# Patient Record
Sex: Female | Born: 1965
Health system: Southern US, Community
[De-identification: ages and names within clinical notes are randomized; demographics above are authoritative.]

## PROBLEM LIST (undated history)

## (undated) DIAGNOSIS — K259 Gastric ulcer, unspecified as acute or chronic, without hemorrhage or perforation: Secondary | ICD-10-CM

## (undated) DIAGNOSIS — G629 Polyneuropathy, unspecified: Secondary | ICD-10-CM

## (undated) DIAGNOSIS — Z9889 Other specified postprocedural states: Secondary | ICD-10-CM

## (undated) DIAGNOSIS — M549 Dorsalgia, unspecified: Secondary | ICD-10-CM

## (undated) DIAGNOSIS — I491 Atrial premature depolarization: Secondary | ICD-10-CM

## (undated) DIAGNOSIS — M1990 Unspecified osteoarthritis, unspecified site: Secondary | ICD-10-CM

## (undated) DIAGNOSIS — Z8719 Personal history of other diseases of the digestive system: Secondary | ICD-10-CM

## (undated) DIAGNOSIS — K219 Gastro-esophageal reflux disease without esophagitis: Secondary | ICD-10-CM

## (undated) DIAGNOSIS — M5417 Radiculopathy, lumbosacral region: Secondary | ICD-10-CM

## (undated) DIAGNOSIS — G544 Lumbosacral root disorders, not elsewhere classified: Secondary | ICD-10-CM

## (undated) DIAGNOSIS — R059 Cough, unspecified: Secondary | ICD-10-CM

## (undated) DIAGNOSIS — R000 Tachycardia, unspecified: Secondary | ICD-10-CM

## (undated) DIAGNOSIS — F32A Depression, unspecified: Secondary | ICD-10-CM

## (undated) DIAGNOSIS — F329 Major depressive disorder, single episode, unspecified: Secondary | ICD-10-CM

## (undated) DIAGNOSIS — E119 Type 2 diabetes mellitus without complications: Secondary | ICD-10-CM

## (undated) DIAGNOSIS — G8929 Other chronic pain: Secondary | ICD-10-CM

## (undated) DIAGNOSIS — F419 Anxiety disorder, unspecified: Secondary | ICD-10-CM

## (undated) DIAGNOSIS — R51 Headache: Secondary | ICD-10-CM

## (undated) DIAGNOSIS — G4733 Obstructive sleep apnea (adult) (pediatric): Secondary | ICD-10-CM

## (undated) DIAGNOSIS — F4310 Post-traumatic stress disorder, unspecified: Secondary | ICD-10-CM

## (undated) DIAGNOSIS — G43909 Migraine, unspecified, not intractable, without status migrainosus: Secondary | ICD-10-CM

## (undated) DIAGNOSIS — E669 Obesity, unspecified: Secondary | ICD-10-CM

## (undated) DIAGNOSIS — I499 Cardiac arrhythmia, unspecified: Secondary | ICD-10-CM

## (undated) DIAGNOSIS — R05 Cough: Secondary | ICD-10-CM

## (undated) DIAGNOSIS — I10 Essential (primary) hypertension: Secondary | ICD-10-CM

## (undated) DIAGNOSIS — R32 Unspecified urinary incontinence: Secondary | ICD-10-CM

## (undated) DIAGNOSIS — M797 Fibromyalgia: Secondary | ICD-10-CM

## (undated) HISTORY — DX: Headache: R51

## (undated) HISTORY — DX: Radiculopathy, lumbosacral region: M54.17

## (undated) HISTORY — PX: CARDIAC CATHETERIZATION: SHX172

## (undated) HISTORY — DX: Depression, unspecified: F32.A

## (undated) HISTORY — DX: Anxiety disorder, unspecified: F41.9

## (undated) HISTORY — DX: Major depressive disorder, single episode, unspecified: F32.9

## (undated) HISTORY — PX: DILATION AND CURETTAGE OF UTERUS: SHX78

## (undated) HISTORY — DX: Lumbosacral root disorders, not elsewhere classified: G54.4

## (undated) HISTORY — DX: Obstructive sleep apnea (adult) (pediatric): G47.33

## (undated) HISTORY — DX: Obesity, unspecified: E66.9

## (undated) HISTORY — PX: ABLATION: SHX5711

## (undated) HISTORY — PX: ABDOMINAL HYSTERECTOMY: SHX81

---

## 1999-01-28 HISTORY — PX: BREAST SURGERY: SHX581

## 2000-01-06 ENCOUNTER — Encounter (INDEPENDENT_AMBULATORY_CARE_PROVIDER_SITE_OTHER): Payer: Self-pay | Admitting: Specialist

## 2000-01-06 ENCOUNTER — Ambulatory Visit (HOSPITAL_BASED_OUTPATIENT_CLINIC_OR_DEPARTMENT_OTHER): Admission: RE | Admit: 2000-01-06 | Discharge: 2000-01-06 | Payer: Self-pay | Admitting: Specialist

## 2002-01-27 HISTORY — PX: CHOLECYSTECTOMY: SHX55

## 2002-12-04 ENCOUNTER — Inpatient Hospital Stay (HOSPITAL_COMMUNITY): Admission: AD | Admit: 2002-12-04 | Discharge: 2002-12-07 | Payer: Self-pay | Admitting: Psychiatry

## 2004-01-30 ENCOUNTER — Ambulatory Visit: Payer: Self-pay | Admitting: Cardiology

## 2007-07-26 ENCOUNTER — Ambulatory Visit: Payer: Self-pay | Admitting: Cardiology

## 2007-08-19 ENCOUNTER — Ambulatory Visit: Payer: Self-pay | Admitting: Cardiology

## 2007-09-10 ENCOUNTER — Ambulatory Visit: Payer: Self-pay | Admitting: Cardiology

## 2008-01-28 HISTORY — PX: BACK SURGERY: SHX140

## 2008-10-26 DIAGNOSIS — I10 Essential (primary) hypertension: Secondary | ICD-10-CM | POA: Insufficient documentation

## 2008-10-26 DIAGNOSIS — R0989 Other specified symptoms and signs involving the circulatory and respiratory systems: Secondary | ICD-10-CM | POA: Insufficient documentation

## 2008-10-26 DIAGNOSIS — I471 Supraventricular tachycardia: Secondary | ICD-10-CM

## 2010-06-11 NOTE — Assessment & Plan Note (Signed)
[facility]                          [facility] OFFICE NOTE   NAME:Ashley [name]                          MRN:          [id number]  DATE:09/10/2007                            DOB:          1965/02/27    PRIMARY CARDIOLOGIST:  Learta Codding, MD, [facility]   REASON FOR VISIT:  Post-hospital followup.   HISTORY OF PRESENT ILLNESS:  Ashley [name] is a pleasant 45 year old woman  with a history of anxiety and hypertension with previous exercise  Cardiolite back in 2006 demonstrating no frank ischemia with a normal  left ventricular ejection fraction of 63%.  She was recently seen in the  hospital by Dr. Andee Lineman as an inpatient consult with evidence of a wide  complex tachycardia in the setting of hypokalemia.  She ruled out for  myocardial infarction with normal troponin-I levels and also had a  normal TSH.  Echocardiography demonstrated no major structural defects  with an ejection fraction of 60-65% and no major valvular abnormalities.  She was placed on verapamil SR 120 mg daily and reports that this has  helped some, although she continues to have a sense of palpitations and  general chest discomfort particularly with anxiety and increased stress  in her life.  She was referred for a CardioNet monitor.  I reviewed  these strips today, which were previously reviewed by Mr. Alben Spittle.  There  is 1 episode documented of what looks to be a fairly regular narrow  complex tachycardia that may be due to a reentrant mechanism.  This was  brief, lasting less than 10 seconds.  It may have been that her wide  complex rhythm in the hospital was an aberrantly conducted  supraventricular arrhythmia.  In any event, she has not had any  reproducible exertional chest pain and she has had no syncope with these  events.   ALLERGIES:  SULFA DRUGS and MORPHINE.   PRESENT MEDICATIONS:  1. Celexa 20 mg p.o. daily.  2. Verapamil SR 120 mg p.o. daily.  3. Aspirin 81 mg p.o.  daily.  4. Xanax 0.5 mg p.o. p.r.n.  5. Vicodin 5/500 mg p.o. p.r.n.  6. Imitrex 100 mg p.r.n.   REVIEW OF SYSTEMS:  As per history of present illness.  Negative.   PHYSICAL EXAMINATION:  VITAL SIGNS:  Blood pressure 153/95, heart rate  83, and weight is 184 pounds.  GENERAL:  The patient is comfortable in no acute distress.  HEENT:  Conjunctiva is normal.  Oropharynx is clear.  NECK:  Supple.  No elevated jugular venous pressure.  No audible bruits.  LUNGS:  Clear with diminished breath sounds.  CARDIAC:  Regular rate and rhythm.  No pathologic murmur, S3 gallop, or  pericardial rub.  ABDOMEN:  Soft, nontender.  EXTREMITIES:  Exhibit no frank pitting edema.  Distal pulse 2+.  SKIN:  Warm and dry.  MUSCULOSKELETAL:  No kyphosis noted.  NEUROPSYCHIATRIC:  The patient is alert and oriented x3, somewhat  anxious at baseline.   IMPRESSION AND RECOMMENDATIONS:  Evidence of supraventricular  tachycardia, possibly due to a reentrant mechanism based on a  brief  event documented on CardioNet monitoring.  She also had an episode of  limited wide complex tachycardia during her last hospital stay at which  time she ruled out for myocardial infarction and had no major structural  cardiac defects by echocardiography.  Previous ischemic testing in 2006  was reassuring and she is not reporting any clearly reproducible  exertional chest pain.  At this point, I plan to increase her verapamil  SR to 180 mg daily.  I spoke about avoiding stimulants and caffeine as  well.  She also reports that stress and anxiety seem to be a major  component of precipitating these events.  I will plan to have her follow  up see Dr. Andee Lineman over the next 3-4 months.  If she manifests  progressive symptoms despite medical therapy then an electrophysiology  referral may be warranted.     Jonelle Sidle, MD  Electronically Signed    SGM/MedQ  DD: 09/10/2007  DT: 09/11/2007  Job #: [id number]   cc:   Learta Codding, MD,FACC
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
## 2010-06-14 NOTE — Op Note (Signed)
[place]  Patient:    Ashley [name], Ashley [name]                          MRN: [id number] Proc. Date: 01/06/00 Adm. Date:  [id number] Attending:  Gustavus Messing CC:         Yaakov Guthrie. Shon Hough, M.D. 2 copies   Operative Report  HISTORY OF PRESENT ILLNESS:  A 45 year old lady with severe macromastia and excessive breast tissue causing increased back and shoulder pain secondary to large pendulous breasts, intertrigo, and ______.  The patient has tried all kinds of conservative treatment with no avail.  PROCEDURES PLANNED:  Bilateral breasts reductions using the inferior pedicle technique.  SURGEON:  Yaakov Guthrie. Shon Hough, M.D.  ASSISTANT:  Oren Binet, R.N.  ANESTHESIA:  General.  DESCRIPTION OF PROCEDURE:  The patient was set up and drawn for the inferior pedicle reduction and mammoplasty.  Underwent general anesthesia, intubated orally, prepped to the chest and breasts areas in the routine fashion using Betadine soaking solution.  Walled off with sterile towels and draped so as to make a sterile field.  The areas were injected with Xylocaine 1% with epinephrine 1:300,000 concentration, a total of 150 cc per side.  The walls with scored with #15 blades, then the skin under the inferior pedicle was de-epithelialized with a #20 blade.  The medial and lateral fat pedicles were excised out the underlying fascia.  Hemostasis was maintained with a Bovie and anticoagulation.  After this, the new keyhole area was also debulked, and after proper hemostasis and irrigation, out laterally more tissue was taken and removed accessory breast tissue overlying the latissimus dorsi serratus anterior, and up above the axillary areas.  After proper hemostasis the wound flaps were transposed and stayed with 3-0 Prolene.  Subcutaneous closure was done with 3-0 monocryl x 2 layers, and a running subcuticular stitch of 3-0 Monocryl and 5-0 Monocryl throughout an inverted  T.  The wounds were cleansed. Steri-Strips and soft dressings were applied to all the areas.  The wound was re-drained with #10 Blake drains which were placed in ______ above the outermost portion of the incision.  Secured with 3-0 Prolene.  After a sterile dressing was applied, including Xeroform, 4 x 4, ABD, and hyperfix tape, the patient was taken to the recovery room for evaluation and observation. DD:  01/06/00 TD:  01/06/00 Job: [id number]

## 2010-08-05 ENCOUNTER — Encounter: Payer: Self-pay | Admitting: *Deleted

## 2010-08-05 DIAGNOSIS — M5430 Sciatica, unspecified side: Secondary | ICD-10-CM | POA: Insufficient documentation

## 2010-08-05 DIAGNOSIS — IMO0001 Reserved for inherently not codable concepts without codable children: Secondary | ICD-10-CM | POA: Insufficient documentation

## 2010-08-06 ENCOUNTER — Emergency Department (HOSPITAL_COMMUNITY): Payer: Self-pay

## 2010-08-06 ENCOUNTER — Emergency Department (HOSPITAL_COMMUNITY)
Admission: EM | Admit: 2010-08-06 | Discharge: 2010-08-06 | Disposition: A | Discharge status: Home / Self Care | Payer: Self-pay | Attending: Emergency Medicine | Admitting: Emergency Medicine

## 2010-08-06 DIAGNOSIS — M5430 Sciatica, unspecified side: Secondary | ICD-10-CM

## 2010-08-06 HISTORY — DX: Major depressive disorder, single episode, unspecified: F32.9

## 2010-08-06 HISTORY — DX: Post-traumatic stress disorder, unspecified: F43.10

## 2010-08-06 HISTORY — DX: Fibromyalgia: M79.7

## 2010-08-06 HISTORY — DX: Depression, unspecified: F32.A

## 2010-08-06 MED ORDER — DIPHENHYDRAMINE HCL 50 MG/ML IJ SOLN
25.0000 mg | Freq: Once | INTRAMUSCULAR | Status: AC
  Administered 2010-08-06: 25 mg via INTRAVENOUS
  Filled 2010-08-06: qty 1

## 2010-08-06 MED ORDER — PREDNISONE 10 MG PO TABS: 20.0000 mg | ORAL_TABLET | Freq: Every day | ORAL | Status: AC

## 2010-08-06 MED ORDER — HYDROMORPHONE HCL 1 MG/ML IJ SOLN
1.0000 mg | Freq: Once | INTRAMUSCULAR | Status: AC
  Administered 2010-08-06: 1 mg via INTRAVENOUS
  Filled 2010-08-06: qty 1

## 2010-08-06 MED ORDER — KETOROLAC TROMETHAMINE 30 MG/ML IJ SOLN
60.0000 mg | Freq: Once | INTRAMUSCULAR | Status: AC
  Administered 2010-08-06: 60 mg via INTRAMUSCULAR
  Filled 2010-08-06: qty 2

## 2010-08-06 MED ORDER — HYDROMORPHONE HCL 2 MG PO TABS: 2.0000 mg | ORAL_TABLET | ORAL | Status: AC | PRN

## 2010-08-06 NOTE — ED Notes (Signed)
States pain is returning in her left groin

## 2010-08-06 NOTE — ED Provider Notes (Addendum)
History     Chief Complaint  Patient presents with  . Back Pain   Patient is a 45 y.o. female presenting with back pain. The history is provided by the patient.  Back Pain  This is a recurrent problem. The current episode started 2 days ago. The problem occurs constantly. The problem has been gradually worsening. The pain is associated with twisting. The pain is present in the sacro-iliac joint. The quality of the pain is described as stabbing, shooting and burning. The pain radiates to the left thigh. The pain is at a severity of 10/10. The symptoms are aggravated by certain positions. The pain is the same all the time. Associated symptoms include leg pain, paresthesias and tingling. Pertinent negatives include no bowel incontinence, no bladder incontinence, no dysuria and no weakness. She has tried NSAIDs and analgesics for the symptoms. The treatment provided no relief.    Past Medical History  Diagnosis Date  . Fibromyalgia   . Depression   . PTSD (post-traumatic stress disorder)     Past Surgical History  Procedure Date  . Back surgery   . Breast surgery   . Cholecystectomy   . Abdominal hysterectomy     History reviewed. No pertinent family history.  History  Substance Use Topics  . Smoking status: Never Smoker   . Smokeless tobacco: Not on file  . Alcohol Use: Yes     ocassional    OB History    Grav Para Term Preterm Abortions TAB SAB Ect Mult Living                  Review of Systems  Gastrointestinal: Negative for bowel incontinence.  Genitourinary: Negative for bladder incontinence and dysuria.  Musculoskeletal: Positive for back pain.  Neurological: Positive for tingling and paresthesias. Negative for weakness.  All other systems reviewed and are negative.    Physical Exam  BP 171/108  Pulse 98  Temp(Src) 97.4 F (36.3 C) (Oral)  Resp 20  Ht 5\' 5"  (1.651 m)  Wt 188 lb (85.276 kg)  BMI 31.28 kg/m2  SpO2 100%  Physical Exam  Nursing note and  vitals reviewed. Constitutional: She is oriented to person, place, and time. She appears well-developed and well-nourished.  HENT:  Head: Normocephalic and atraumatic.  Mouth/Throat: Oropharynx is clear and moist.  Eyes: EOM are normal. Pupils are equal, round, and reactive to light.  Neck: Normal range of motion. Neck supple.  Cardiovascular: Normal rate and regular rhythm.   Pulmonary/Chest: Effort normal and breath sounds normal.  Abdominal: Soft. Bowel sounds are normal.  Neurological: She is alert and oriented to person, place, and time. She has normal reflexes.       Straight leg raise positive on left  Skin: Skin is warm and dry.    ED Course  Procedures  MDM Medical screening examination/treatment/procedure(s) were performed by non-physician practitioner and as supervising physician I was immediately available for consultation/collaboration.   Nicoletta Dress. Colon Branch, MD 08/06/10 1610  Nicoletta Dress. Colon Branch, MD 08/15/10 [phone]

## 2010-08-06 NOTE — ED Notes (Addendum)
Wrong Pt.  

## 2010-08-10 ENCOUNTER — Encounter (HOSPITAL_COMMUNITY): Payer: Self-pay

## 2010-08-10 ENCOUNTER — Emergency Department (HOSPITAL_COMMUNITY): Payer: Self-pay

## 2010-08-10 ENCOUNTER — Emergency Department (HOSPITAL_COMMUNITY)
Admission: EM | Admit: 2010-08-10 | Discharge: 2010-08-10 | Disposition: A | Discharge status: Home / Self Care | Payer: Self-pay | Attending: Emergency Medicine | Admitting: Emergency Medicine

## 2010-08-10 DIAGNOSIS — M549 Dorsalgia, unspecified: Secondary | ICD-10-CM | POA: Insufficient documentation

## 2010-08-10 DIAGNOSIS — G8929 Other chronic pain: Secondary | ICD-10-CM | POA: Insufficient documentation

## 2010-08-10 MED ORDER — DIAZEPAM 5 MG PO TABS
5.0000 mg | ORAL_TABLET | Freq: Once | ORAL | Status: AC
  Administered 2010-08-10: 5 mg via ORAL
  Filled 2010-08-10: qty 1

## 2010-08-10 MED ORDER — DIPHENHYDRAMINE HCL 12.5 MG/5ML PO ELIX
12.5000 mg | ORAL_SOLUTION | Freq: Once | ORAL | Status: AC
  Administered 2010-08-10: 12.5 mg via ORAL
  Filled 2010-08-10: qty 5

## 2010-08-10 MED ORDER — HYDROCODONE-ACETAMINOPHEN 5-325 MG PO TABS
2.0000 | ORAL_TABLET | Freq: Once | ORAL | Status: AC
  Administered 2010-08-10: 2 via ORAL
  Filled 2010-08-10: qty 2

## 2010-08-10 MED ORDER — ONDANSETRON HCL 4 MG PO TABS
4.0000 mg | ORAL_TABLET | Freq: Once | ORAL | Status: AC
  Administered 2010-08-10: 4 mg via ORAL
  Filled 2010-08-10: qty 1

## 2010-08-10 MED ORDER — FENTANYL CITRATE 0.05 MG/ML IJ SOLN
100.0000 ug | Freq: Once | INTRAMUSCULAR | Status: AC
  Administered 2010-08-10: 100 ug via INTRAMUSCULAR
  Filled 2010-08-10: qty 2

## 2010-08-10 NOTE — ED Provider Notes (Signed)
History     Chief Complaint  Patient presents with  . Back Pain   Patient is a 45 y.o. female presenting with back pain. The history is provided by the patient.  Back Pain  This is a chronic problem. The problem occurs constantly. The problem has been gradually worsening. The pain is associated with falling. The pain is present in the lumbar spine. The pain is at a severity of 10/10. The pain is severe. The pain is the same all the time. Stiffness is present all day. Pertinent negatives include no chest pain, no abdominal pain and no dysuria. She has tried analgesics and muscle relaxants for the symptoms. The treatment provided no relief.    Past Medical History  Diagnosis Date  . Fibromyalgia   . Depression   . PTSD (post-traumatic stress disorder)     Past Surgical History  Procedure Date  . Back surgery   . Breast surgery   . Cholecystectomy   . Abdominal hysterectomy   . Cesarean section   . Dilation and curettage of uterus     No family history on file.  History  Substance Use Topics  . Smoking status: Never Smoker   . Smokeless tobacco: Not on file  . Alcohol Use: Yes     ocassional    OB History    Grav Para Term Preterm Abortions TAB SAB Ect Mult Living                  Review of Systems  Constitutional: Negative for activity change.       All ROS Neg except as noted in HPI  HENT: Negative for nosebleeds and neck pain.   Eyes: Negative for photophobia and discharge.  Respiratory: Negative for cough, shortness of breath and wheezing.   Cardiovascular: Negative for chest pain and palpitations.  Gastrointestinal: Negative for abdominal pain and blood in stool.  Genitourinary: Negative for dysuria, frequency and hematuria.  Musculoskeletal: Positive for myalgias, back pain and arthralgias.  Skin: Negative.   Neurological: Negative for dizziness, seizures and speech difficulty.  Psychiatric/Behavioral: Negative for hallucinations and confusion.     Physical Exam  BP 177/109  Pulse 117  Temp(Src) 99.1 F (37.3 C) (Oral)  Ht 5\' 5"  (1.651 m)  Wt 188 lb (85.276 kg)  BMI 31.28 kg/m2  SpO2 98%  Physical Exam  Nursing note and vitals reviewed. Constitutional: She is oriented to person, place, and time. She appears well-developed and well-nourished.  Non-toxic appearance.  HENT:  Head: Normocephalic.  Right Ear: Tympanic membrane and external ear normal.  Left Ear: Tympanic membrane and external ear normal.  Eyes: EOM and lids are normal. Pupils are equal, round, and reactive to light.  Neck: Normal range of motion. Neck supple. Carotid bruit is not present.  Cardiovascular: Normal rate, regular rhythm, normal heart sounds, intact distal pulses and normal pulses.   Pulmonary/Chest: Breath sounds normal. No respiratory distress.  Abdominal: Soft. Bowel sounds are normal. There is no tenderness. There is no guarding.  Musculoskeletal: Normal range of motion.       Lumbar area pain to palpation and with attempted ROM.   Lymphadenopathy:       Head (right side): No submandibular adenopathy present.       Head (left side): No submandibular adenopathy present.    She has no cervical adenopathy.  Neurological: She is alert and oriented to person, place, and time. She has normal strength. No cranial nerve deficit or sensory deficit.  Skin: Skin  is warm and dry.  Psychiatric: She has a normal mood and affect. Her speech is normal.    ED Course  Procedures  MDM I have reviewed nursing notes, vital signs, and all appropriate lab and imaging results for this patient.      Kathie Dike, PA 08/10/10 1831  Kathie Dike, PA 08/10/10 2017

## 2010-08-10 NOTE — ED Notes (Signed)
Pt states she has not taken her verapamil because she has ran out and can not afford all of her medications. Pt bought her gabapentin over bp med per pt. Pt very tearful d/t pain. Pt states she recently MRI May 9th for back pain. Pt states this recent fall has triggered new pain that has affected her daily life.

## 2010-08-10 NOTE — ED Notes (Signed)
Pt fell down two steps 1 week ago. Pt states pain did not happen until next day. Pain is left lower back/hip pain that radiates down left leg. Pt recently seen here for same and then followed up with PCP next day. Pt was given steriod injection at dr. Isidore Moos and to come back if pain is worse. Pt states pain has progressively gotten worse and dr office closed today. No difficulty with bowel or bladder.

## 2010-10-22 ENCOUNTER — Emergency Department (HOSPITAL_COMMUNITY): Payer: Self-pay

## 2010-10-22 ENCOUNTER — Emergency Department (HOSPITAL_COMMUNITY)
Admission: EM | Admit: 2010-10-22 | Discharge: 2010-10-22 | Disposition: A | Discharge status: Home / Self Care | Payer: Self-pay | Attending: Emergency Medicine | Admitting: Emergency Medicine

## 2010-10-22 ENCOUNTER — Encounter (HOSPITAL_COMMUNITY): Payer: Self-pay

## 2010-10-22 DIAGNOSIS — R609 Edema, unspecified: Secondary | ICD-10-CM

## 2010-10-22 DIAGNOSIS — IMO0001 Reserved for inherently not codable concepts without codable children: Secondary | ICD-10-CM | POA: Insufficient documentation

## 2010-10-22 DIAGNOSIS — R220 Localized swelling, mass and lump, head: Secondary | ICD-10-CM | POA: Insufficient documentation

## 2010-10-22 DIAGNOSIS — F4310 Post-traumatic stress disorder, unspecified: Secondary | ICD-10-CM | POA: Insufficient documentation

## 2010-10-22 DIAGNOSIS — F3289 Other specified depressive episodes: Secondary | ICD-10-CM | POA: Insufficient documentation

## 2010-10-22 DIAGNOSIS — I10 Essential (primary) hypertension: Secondary | ICD-10-CM | POA: Insufficient documentation

## 2010-10-22 DIAGNOSIS — F329 Major depressive disorder, single episode, unspecified: Secondary | ICD-10-CM | POA: Insufficient documentation

## 2010-10-22 LAB — CBC
HCT: 39.5 % (ref 36.0–46.0)
MCHC: 32.4 g/dL (ref 30.0–36.0)
RDW: 12.4 % (ref 11.5–15.5)
WBC: 7.3 10*3/uL (ref 4.0–10.5)

## 2010-10-22 LAB — BASIC METABOLIC PANEL
CO2: 27 mEq/L (ref 19–32)
Chloride: 104 mEq/L (ref 96–112)
Creatinine, Ser: 0.73 mg/dL (ref 0.50–1.10)
GFR calc Af Amer: >60 mL/min (ref >60)
Potassium: 3.4 mEq/L — ABNORMAL LOW (ref 3.5–5.1)
Sodium: 140 mEq/L (ref 135–145)

## 2010-10-22 LAB — DIFFERENTIAL
Basophils Absolute: 0 10*3/uL (ref 0.0–0.1)
Basophils Relative: 0 % (ref 0–1)
Lymphocytes Relative: 20 % (ref 12–46)
Monocytes Absolute: 0.3 10*3/uL (ref 0.1–1.0)
Neutro Abs: 5.4 10*3/uL (ref 1.7–7.7)
Neutrophils Relative %: 74 % (ref 43–77)

## 2010-10-22 MED ORDER — HYDROCODONE-ACETAMINOPHEN 5-325 MG PO TABS: 1.0000 | ORAL_TABLET | Freq: Four times a day (QID) | ORAL | Status: AC | PRN

## 2010-10-22 MED ORDER — PREDNISONE 20 MG PO TABS
40.0000 mg | ORAL_TABLET | Freq: Once | ORAL | Status: AC
  Administered 2010-10-22: 40 mg via ORAL
  Filled 2010-10-22: qty 2

## 2010-10-22 MED ORDER — IOHEXOL 300 MG/ML  SOLN
80.0000 mL | Freq: Once | INTRAMUSCULAR | Status: AC | PRN
  Administered 2010-10-22: 80 mL via INTRAVENOUS

## 2010-10-22 MED ORDER — EPINEPHRINE 0.3 MG/0.3ML IJ DEVI
0.3000 mg | Freq: Once | INTRAMUSCULAR | Status: AC
  Administered 2010-10-22: 0.3 mg via INTRAMUSCULAR
  Filled 2010-10-22: qty 0.3

## 2010-10-22 MED ORDER — SODIUM CHLORIDE 0.9 % IV SOLN
INTRAVENOUS | Status: DC
  Administered 2010-10-22: 16:00:00 via INTRAVENOUS

## 2010-10-22 MED ORDER — DIPHENHYDRAMINE HCL 25 MG PO CAPS
50.0000 mg | ORAL_CAPSULE | Freq: Once | ORAL | Status: AC
  Administered 2010-10-22: 50 mg via ORAL
  Filled 2010-10-22: qty 2

## 2010-10-22 NOTE — ED Provider Notes (Signed)
Ct shows parotid gland swollen.  Pt will follow up with ent  Benny Lennert, MD 10/22/10 1818

## 2010-10-22 NOTE — ED Provider Notes (Signed)
History   Chart scribed for Carleene Cooper III, MD by Enos Fling; the patient was seen in room [facility]; this patient's care was started at 2:31 PM.    CSN: [id number] Arrival date & time: 10/22/2010  1:15 PM  Chief Complaint  Patient presents with  . Insect Bite  . Facial Swelling    HPI Ashley [name] is a 45 y.o. female who presents to the Emergency Department complaining of allergic reaction. Pt states she was outside just pta about to eat lunch when she noticed her right cheek/angle of jaw was tight and swollen. Pt has allergies to bee stings with similar reaction but does not recall an insect bite today. Swelling is unchanged from onset with associated pain to area. Nothing tried to relieve symptoms. Pt denies chest tightness, sob, throat swelling, difficulty swallowing, or other rash.    Past Medical History  Diagnosis Date  . Fibromyalgia   . Depression   . PTSD (post-traumatic stress disorder)   Recent dx HTN  Past Surgical History  Procedure Date  . Back surgery   . Breast surgery   . Cholecystectomy   . Abdominal hysterectomy   . Cesarean section   . Dilation and curettage of uterus     No family history on file.  History  Substance Use Topics  . Smoking status: Never Smoker   . Smokeless tobacco: Not on file  . Alcohol Use: Yes     ocassional    OB History    Grav Para Term Preterm Abortions TAB SAB Ect Mult Living                  Review of Systems 10 Systems reviewed and are negative for acute change except as noted in the HPI.   Allergies  Bee; Latex; Morphine and related; and Sulfa antibiotics  Home Medications   Current Outpatient Rx  Name Route Sig Dispense Refill  . ALPRAZOLAM 0.5 MG PO TABS Oral Take 0.5 mg by mouth 4 (four) times daily as needed. anxiety    . DULOXETINE HCL 60 MG PO CPEP Oral Take 60 mg by mouth 2 (two) times daily.      . ETODOLAC 500 MG PO TABS Oral Take 500 mg by mouth 2 (two) times daily.      Marland Kitchen GABAPENTIN  600 MG PO TABS Oral Take 1,200 mg by mouth 2 (two) times daily between meals.      Marland Kitchen PROMETHAZINE HCL 25 MG PO TABS Oral Take 25 mg by mouth every 6 (six) hours as needed.      Marland Kitchen TRAMADOL HCL 50 MG PO TABS Oral Take 50 mg by mouth every 6 (six) hours as needed. pain    . VERAPAMIL HCL 80 MG PO TABS Oral Take 80 mg by mouth 2 (two) times daily.      Marland Kitchen DIAZEPAM 5 MG PO TABS Oral Take by mouth.      . VERAPAMIL HCL 180 MG PO TBCR Oral Take 180 mg by mouth 1 day or 1 dose.       Physical Exam    BP 155/106  Pulse 96  Temp(Src) 99 F (37.2 C) (Oral)  Resp 16  Ht 5\' 4"  (1.626 m)  Wt 180 lb (81.647 kg)  BMI 30.90 kg/m2  SpO2 100%  Physical Exam  Nursing note and vitals reviewed. Constitutional: She is oriented to person, place, and time. She appears well-developed and well-nourished. No distress.  HENT:  Head: Normocephalic.  Nose: Nose normal.  Mouth/Throat: Oropharynx is clear and moist and mucous membranes are normal.       No sign of tenderness over salivary gland.  Eyes: Conjunctivae are normal.  Neck: Normal range of motion. Neck supple.  Cardiovascular: Normal rate, regular rhythm and intact distal pulses.  Exam reveals no gallop and no friction rub.   No murmur heard. Pulmonary/Chest: Effort normal and breath sounds normal. No respiratory distress. She has no wheezes. She has no rales.  Abdominal: Soft. There is no tenderness.  Musculoskeletal: Normal range of motion. She exhibits no edema and no tenderness.  Neurological: She is alert and oriented to person, place, and time.  Skin: Skin is warm and dry. No rash noted.       Swelling with central small mark to right cheek just below angle of jaw, swelling extends under jaw and behind and in front of right ear. No other rash  Psychiatric: She has a normal mood and affect.    ED Course  Procedures - none  3:50 PM Pt was given epinephrine, prednisone, and Bendryl.  Her swelling and pain have increased, not gotten better.   Will order lab and x-rays to see if this is related to her parotid or submandibular glands on the right. Discussed with Dr. Francene Boyers, radiologist, who advised that CT maxillofacial with IV contrast is the study to order.  Pt signed out to Bethann Berkshire, M.D. At 4:00 P.M.    OTHER DATA REVIEWED: Nursing notes and vital signs reviewed.   ED COURSE:   All results reviewed and discussed, questions answered, pt agreeable with plan.    IMPRESSION: No diagnosis found.    MEDS GIVEN IN ED:  Medications  verapamil (CALAN) 80 MG tablet (not administered)     DISCHARGE MEDICATIONS: New Prescriptions   No medications on file     SCRIBE ATTESTATION: I personally performed the services described in this documentation, which was scribed in my presence. The recorded information has been reviewed and considered. Osvaldo Human, MD        Carleene Cooper III, MD 10/22/10 [phone]

## 2010-10-22 NOTE — ED Notes (Signed)
Pt at home eating lunch and felt her rt side of her neck/face "feel funny" pt states she also feels like her tongue is "tingling" speech clear at this time. Pt states she is allergic to bee's but does not think she was stung by bee. Small pin point red area noted to bottom of right jaw line near ear. Pt c/o shooting pain. Swelling present.

## 2010-10-24 ENCOUNTER — Other Ambulatory Visit (HOSPITAL_COMMUNITY): Payer: Self-pay | Admitting: Family Medicine

## 2010-10-24 DIAGNOSIS — Z139 Encounter for screening, unspecified: Secondary | ICD-10-CM

## 2010-10-31 ENCOUNTER — Ambulatory Visit (HOSPITAL_COMMUNITY)
Admission: RE | Admit: 2010-10-31 | Discharge: 2010-10-31 | Disposition: A | Discharge status: Home / Self Care | Payer: PRIVATE HEALTH INSURANCE | Source: Ambulatory Visit | Attending: Family Medicine | Admitting: Family Medicine

## 2010-10-31 DIAGNOSIS — Z1231 Encounter for screening mammogram for malignant neoplasm of breast: Secondary | ICD-10-CM | POA: Insufficient documentation

## 2010-10-31 DIAGNOSIS — Z139 Encounter for screening, unspecified: Secondary | ICD-10-CM

## 2010-11-13 ENCOUNTER — Other Ambulatory Visit: Payer: Self-pay | Admitting: Family Medicine

## 2010-11-13 DIAGNOSIS — R928 Other abnormal and inconclusive findings on diagnostic imaging of breast: Secondary | ICD-10-CM

## 2010-11-18 ENCOUNTER — Encounter (HOSPITAL_COMMUNITY): Payer: Self-pay | Admitting: *Deleted

## 2010-11-18 ENCOUNTER — Emergency Department (HOSPITAL_COMMUNITY): Payer: Medicaid Other

## 2010-11-18 ENCOUNTER — Emergency Department (HOSPITAL_COMMUNITY)
Admission: EM | Admit: 2010-11-18 | Discharge: 2010-11-18 | Disposition: A | Discharge status: Home / Self Care | Payer: Medicaid Other | Attending: Emergency Medicine | Admitting: Emergency Medicine

## 2010-11-18 DIAGNOSIS — M25539 Pain in unspecified wrist: Secondary | ICD-10-CM | POA: Insufficient documentation

## 2010-11-18 DIAGNOSIS — F329 Major depressive disorder, single episode, unspecified: Secondary | ICD-10-CM | POA: Insufficient documentation

## 2010-11-18 DIAGNOSIS — M25519 Pain in unspecified shoulder: Secondary | ICD-10-CM | POA: Insufficient documentation

## 2010-11-18 DIAGNOSIS — G8929 Other chronic pain: Secondary | ICD-10-CM | POA: Insufficient documentation

## 2010-11-18 DIAGNOSIS — F3289 Other specified depressive episodes: Secondary | ICD-10-CM | POA: Insufficient documentation

## 2010-11-18 DIAGNOSIS — W07XXXA Fall from chair, initial encounter: Secondary | ICD-10-CM | POA: Insufficient documentation

## 2010-11-18 DIAGNOSIS — F4310 Post-traumatic stress disorder, unspecified: Secondary | ICD-10-CM | POA: Insufficient documentation

## 2010-11-18 DIAGNOSIS — IMO0001 Reserved for inherently not codable concepts without codable children: Secondary | ICD-10-CM | POA: Insufficient documentation

## 2010-11-18 DIAGNOSIS — M542 Cervicalgia: Secondary | ICD-10-CM | POA: Insufficient documentation

## 2010-11-18 DIAGNOSIS — G5790 Unspecified mononeuropathy of unspecified lower limb: Secondary | ICD-10-CM | POA: Insufficient documentation

## 2010-11-18 DIAGNOSIS — T07XXXA Unspecified multiple injuries, initial encounter: Secondary | ICD-10-CM | POA: Insufficient documentation

## 2010-11-18 DIAGNOSIS — S0990XA Unspecified injury of head, initial encounter: Secondary | ICD-10-CM | POA: Insufficient documentation

## 2010-11-18 DIAGNOSIS — M549 Dorsalgia, unspecified: Secondary | ICD-10-CM | POA: Insufficient documentation

## 2010-11-18 HISTORY — DX: Other chronic pain: G89.29

## 2010-11-18 HISTORY — DX: Polyneuropathy, unspecified: G62.9

## 2010-11-18 HISTORY — DX: Dorsalgia, unspecified: M54.9

## 2010-11-18 MED ORDER — HYDROMORPHONE HCL 1 MG/ML IJ SOLN
1.0000 mg | Freq: Once | INTRAMUSCULAR | Status: AC
  Administered 2010-11-18: 1 mg via INTRAMUSCULAR
  Filled 2010-11-18: qty 1

## 2010-11-18 MED ORDER — KETOROLAC TROMETHAMINE 60 MG/2ML IM SOLN
60.0000 mg | Freq: Once | INTRAMUSCULAR | Status: AC
  Administered 2010-11-18: 60 mg via INTRAMUSCULAR
  Filled 2010-11-18: qty 2

## 2010-11-18 MED ORDER — IBUPROFEN 800 MG PO TABS: 800.0000 mg | ORAL_TABLET | Freq: Three times a day (TID) | ORAL | Status: AC

## 2010-11-18 MED ORDER — OXYCODONE-ACETAMINOPHEN 5-325 MG PO TABS: 1.0000 | ORAL_TABLET | ORAL | Status: AC | PRN

## 2010-11-18 NOTE — ED Notes (Signed)
Discharge instructions reviewed with pt, questions answered, pt verbalized understanding.

## 2010-11-18 NOTE — ED Notes (Signed)
Pt stated pain unchanged, PA aware

## 2010-11-18 NOTE — ED Notes (Signed)
Currently denies C-spine tenderness. See previous triage note.

## 2010-11-18 NOTE — ED Notes (Signed)
Pt states she fell from standing on a chair to reach something. Pt states pain to right side of neck, right shoulder, right wrist, right buttock Pt also states . Pt states numbness to 4th and 5th digit of left hand.

## 2010-11-18 NOTE — ED Notes (Signed)
Pt out to CT via stretcher.

## 2010-11-18 NOTE — ED Notes (Signed)
PA at bedside.

## 2010-11-23 NOTE — ED Provider Notes (Signed)
History     CSN: [id number] Arrival date & time: 11/18/2010 12:07 PM   First MD Initiated Contact with Patient 11/18/10 1231      Chief Complaint  Patient presents with  . Fall    (Consider location/radiation/quality/duration/timing/severity/associated sxs/prior treatment) Patient is a 45 y.o. female presenting with fall. The history is provided by the patient.  Fall The accident occurred 3 to 5 hours ago. Incident: She was standing on a chair when she fell backward,  landing on her right side,  causing pain to her neck,  right shoulder,  wrist and right buttock.  she hit her head against her pool table,  but denies loc. She fell from a height of 1 to 2 ft. She landed on carpet. The pain is present in the right shoulder, right wrist, neck and right hip. The pain is at a severity of 9/10. The pain is severe. She was ambulatory at the scene (She layed down for awhile hoping the pain would improve,  instead it has worsened.). Associated symptoms include numbness. Pertinent negatives include no visual change, no fever, no abdominal pain, no nausea and no headaches. Associated symptoms comments: She describes numbness to her right 4th and fifth fingers.. The symptoms are aggravated by activity and use of the injured limb. She has tried nothing for the symptoms.    Past Medical History  Diagnosis Date  . Fibromyalgia   . Depression   . PTSD (post-traumatic stress disorder)   . Chronic back pain   . Neuropathy     left foot    Past Surgical History  Procedure Date  . Back surgery   . Breast surgery   . Cholecystectomy   . Abdominal hysterectomy   . Cesarean section   . Dilation and curettage of uterus     No family history on file.  History  Substance Use Topics  . Smoking status: Never Smoker   . Smokeless tobacco: Not on file  . Alcohol Use: Yes     ocassional    OB History    Grav Para Term Preterm Abortions TAB SAB Ect Mult Living                  Review of  Systems  Constitutional: Negative for fever.  HENT: Negative for congestion, sore throat, neck pain and neck stiffness.   Eyes: Negative.   Respiratory: Negative for chest tightness and shortness of breath.   Cardiovascular: Negative for chest pain.  Gastrointestinal: Negative for nausea and abdominal pain.  Genitourinary: Negative.   Musculoskeletal: Positive for arthralgias. Negative for joint swelling and gait problem.  Skin: Negative.  Negative for rash and wound.  Neurological: Positive for numbness. Negative for dizziness, syncope, weakness, light-headedness and headaches.  Hematological: Negative.   Psychiatric/Behavioral: Negative.     Allergies  Sulfa antibiotics; Bee; Morphine and related; and Latex  Home Medications   Current Outpatient Rx  Name Route Sig Dispense Refill  . ALPRAZOLAM 0.5 MG PO TABS Oral Take 0.5 mg by mouth 4 (four) times daily as needed. anxiety    . DULOXETINE HCL 60 MG PO CPEP Oral Take 60 mg by mouth 2 (two) times daily.      . ETODOLAC 500 MG PO TABS Oral Take 500 mg by mouth 2 (two) times daily.      Marland Kitchen GABAPENTIN 600 MG PO TABS Oral Take 1,200 mg by mouth 2 (two) times daily between meals.      Marland Kitchen PROMETHAZINE HCL 25 MG PO  TABS Oral Take 25 mg by mouth every 6 (six) hours as needed. For nausea    . TRAMADOL HCL 50 MG PO TABS Oral Take 50 mg by mouth every 6 (six) hours as needed. pain    . VERAPAMIL HCL 80 MG PO TABS Oral Take 80 mg by mouth 2 (two) times daily.      Marland Kitchen DIAZEPAM 5 MG PO TABS Oral Take by mouth.      . IBUPROFEN 800 MG PO TABS Oral Take 1 tablet (800 mg total) by mouth 3 (three) times daily. 21 tablet 0  . OXYCODONE-ACETAMINOPHEN 5-325 MG PO TABS Oral Take 1 tablet by mouth every 4 (four) hours as needed for pain. 15 tablet 0    BP 150/84  Pulse 75  Temp(Src) 97.8 F (36.6 C) (Oral)  Resp 18  Ht 5\' 4"  (1.626 m)  Wt 183 lb (83.008 kg)  BMI 31.41 kg/m2  SpO2 100%  Physical Exam  Nursing note and vitals  reviewed. Constitutional: She is oriented to person, place, and time. She appears well-developed and well-nourished. She appears distressed.       Tearful.   HENT:  Head: Normocephalic and atraumatic.  Eyes: Conjunctivae and EOM are normal. Pupils are equal, round, and reactive to light.  Neck: Muscular tenderness present. No spinous process tenderness present. Decreased range of motion present.  Cardiovascular: Normal rate, regular rhythm, normal heart sounds and intact distal pulses.   Pulmonary/Chest: Effort normal and breath sounds normal. She has no wheezes.  Abdominal: Soft. Bowel sounds are normal. There is no tenderness.  Musculoskeletal:       Right shoulder: She exhibits decreased range of motion, tenderness and pain. She exhibits no swelling, no crepitus, no deformity and normal pulse.       Right hand: She exhibits tenderness. She exhibits normal two-point discrimination, normal capillary refill, no deformity and no swelling. decreased sensation noted. Decreased sensation is present in the ulnar distribution. Normal strength noted. She exhibits no finger abduction, no thumb/finger opposition and no wrist extension trouble.  Neurological: She is alert and oriented to person, place, and time. She has normal strength and normal reflexes. No cranial nerve deficit. Gait normal.       Equal grip strength.  Skin: Skin is warm and dry.  Psychiatric: She has a normal mood and affect.    ED Course  Procedures (including critical care time)  Labs Reviewed - No data to display No results found.   1. Contusion of multiple sites   2. Minor head injury     Dg Cervical Spine Complete  11/18/2010  *RADIOLOGY REPORT*  Clinical Data: Fall.  Neck pain.  CERVICAL SPINE - COMPLETE 4+ VIEW  Comparison: None.  Findings: No evidence for acute fracture.  No subluxation.  There is some mild loss of disc height at C5-6 and C6-7.  The facets are well-aligned bilaterally.  There is no prevertebral  soft tissue swelling.  Mild reversal of the normal cervical lordosis is evident.  IMPRESSION: Lower cervical disc degeneration.  No acute bony findings.  Loss of cervical lordosis.  This can be related to patient positioning, muscle spasm or soft tissue injury.  Original Report Authenticated By: ERIC A. MANSELL, M.D.   Dg Pelvis 1-2 Views  11/18/2010  *RADIOLOGY REPORT*  Clinical Data: Fall.  Pain.  PELVIS - 1-2 VIEW  Comparison: 08/10/2010  Findings: There is no evidence for acute fracture or dislocation. No soft tissue foreign body or gas identified.  Regional bowel  gas pattern is nonobstructive.  Small bone island is identified within the left ischium.  IMPRESSION: Negative exam.  Original Report Authenticated By: Patterson Hammersmith, M.D.   Dg Shoulder Right  11/18/2010  *RADIOLOGY REPORT*  Clinical Data: Fall.  Pain.  RIGHT SHOULDER - 2+ VIEW  Comparison: None.  Findings: There is no evidence for acute fracture or dislocation. No soft tissue foreign body or gas identified.  Right lung apex is unremarkable in appearance.  IMPRESSION: Negative exam.  Original Report Authenticated By: Patterson Hammersmith, M.D.   Dg Wrist Complete Right  11/18/2010  *RADIOLOGY REPORT*  Clinical Data: Fall.  Pain.  RIGHT WRIST - COMPLETE 3+ VIEW  Comparison: None.  Findings: There is no evidence for acute fracture or dislocation. No soft tissue foreign body or gas identified.  Intercarpal spaces appear normal.  IMPRESSION: Negative exam.  Original Report Authenticated By: Patterson Hammersmith, M.D.   Ct Head Wo Contrast  11/18/2010  *RADIOLOGY REPORT*  Clinical Data: There are pain after fall  CT HEAD WITHOUT CONTRAST  Technique:  Contiguous axial images were obtained from the base of the skull through the vertex without contrast.  Comparison: None.  Findings: The ventricles are normal in size, shape, and position. There is no mass effect or midline shift.  No acute hemorrhage or abnormal extra-axial fluid collections  are identified.  The gray/white differentiation is normal.  The orbits, calvarium, visualized paranasal sinuses have a normal appearance.  IMPRESSION: Normal CT scan of the head with no evidence of acute intracranial abnormality.  Original Report Authenticated By: Brandon Melnick, M.D.          MDM  Contusion of multiple sites.          Candis Musa, PA 11/23/10 1124

## 2010-11-26 NOTE — ED Provider Notes (Signed)
Medical screening examination/treatment/procedure(s) were performed by non-physician practitioner and as supervising physician I was immediately available for consultation/collaboration.    L , MD 11/26/10 2312 

## 2010-12-04 ENCOUNTER — Other Ambulatory Visit (HOSPITAL_COMMUNITY): Payer: Self-pay | Admitting: Family Medicine

## 2010-12-04 ENCOUNTER — Ambulatory Visit (HOSPITAL_COMMUNITY)
Admission: RE | Admit: 2010-12-04 | Discharge: 2010-12-04 | Disposition: A | Discharge status: Home / Self Care | Payer: PRIVATE HEALTH INSURANCE | Source: Ambulatory Visit | Attending: Family Medicine | Admitting: Family Medicine

## 2010-12-04 DIAGNOSIS — R928 Other abnormal and inconclusive findings on diagnostic imaging of breast: Secondary | ICD-10-CM

## 2010-12-30 ENCOUNTER — Encounter (HOSPITAL_COMMUNITY): Payer: Self-pay | Admitting: Emergency Medicine

## 2010-12-30 ENCOUNTER — Emergency Department (HOSPITAL_COMMUNITY): Payer: Self-pay

## 2010-12-30 ENCOUNTER — Other Ambulatory Visit: Payer: Self-pay

## 2010-12-30 ENCOUNTER — Emergency Department (HOSPITAL_COMMUNITY)
Admission: EM | Admit: 2010-12-30 | Discharge: 2010-12-30 | Disposition: A | Discharge status: Home / Self Care | Payer: Self-pay | Attending: Emergency Medicine | Admitting: Emergency Medicine

## 2010-12-30 DIAGNOSIS — G5790 Unspecified mononeuropathy of unspecified lower limb: Secondary | ICD-10-CM | POA: Insufficient documentation

## 2010-12-30 DIAGNOSIS — I498 Other specified cardiac arrhythmias: Secondary | ICD-10-CM | POA: Insufficient documentation

## 2010-12-30 DIAGNOSIS — F3289 Other specified depressive episodes: Secondary | ICD-10-CM | POA: Insufficient documentation

## 2010-12-30 DIAGNOSIS — I10 Essential (primary) hypertension: Secondary | ICD-10-CM | POA: Insufficient documentation

## 2010-12-30 DIAGNOSIS — N390 Urinary tract infection, site not specified: Secondary | ICD-10-CM

## 2010-12-30 DIAGNOSIS — M62838 Other muscle spasm: Secondary | ICD-10-CM

## 2010-12-30 DIAGNOSIS — M546 Pain in thoracic spine: Secondary | ICD-10-CM

## 2010-12-30 DIAGNOSIS — IMO0001 Reserved for inherently not codable concepts without codable children: Secondary | ICD-10-CM | POA: Insufficient documentation

## 2010-12-30 DIAGNOSIS — F329 Major depressive disorder, single episode, unspecified: Secondary | ICD-10-CM | POA: Insufficient documentation

## 2010-12-30 DIAGNOSIS — F4310 Post-traumatic stress disorder, unspecified: Secondary | ICD-10-CM | POA: Insufficient documentation

## 2010-12-30 DIAGNOSIS — Z9079 Acquired absence of other genital organ(s): Secondary | ICD-10-CM | POA: Insufficient documentation

## 2010-12-30 DIAGNOSIS — R0789 Other chest pain: Secondary | ICD-10-CM

## 2010-12-30 DIAGNOSIS — R072 Precordial pain: Secondary | ICD-10-CM | POA: Insufficient documentation

## 2010-12-30 HISTORY — DX: Tachycardia, unspecified: R00.0

## 2010-12-30 HISTORY — DX: Migraine, unspecified, not intractable, without status migrainosus: G43.909

## 2010-12-30 HISTORY — DX: Essential (primary) hypertension: I10

## 2010-12-30 LAB — CBC
MCHC: 33.4 g/dL (ref 30.0–36.0)
Platelets: 257 10*3/uL (ref 150–400)
RDW: 12.1 % (ref 11.5–15.5)
WBC: 6.9 10*3/uL (ref 4.0–10.5)

## 2010-12-30 LAB — URINALYSIS, ROUTINE W REFLEX MICROSCOPIC
Nitrite: POSITIVE — AB
Protein, ur: NEGATIVE mg/dL
Urobilinogen, UA: 0.2 mg/dL (ref 0.0–1.0)
pH: 6.5 (ref 5.0–8.0)

## 2010-12-30 LAB — POCT I-STAT TROPONIN I: Troponin i, poc: 0.01 ng/mL (ref 0.00–0.08)

## 2010-12-30 LAB — BASIC METABOLIC PANEL
GFR calc Af Amer: >90 mL/min (ref >90)
GFR calc non Af Amer: >90 mL/min (ref >90)
Potassium: 3.6 mEq/L (ref 3.5–5.1)
Sodium: 139 mEq/L (ref 135–145)

## 2010-12-30 LAB — URINE MICROSCOPIC-ADD ON

## 2010-12-30 LAB — D-DIMER, QUANTITATIVE: D-Dimer, Quant: 0.79 ug/mL-FEU — ABNORMAL HIGH (ref 0.00–0.48)

## 2010-12-30 MED ORDER — METHOCARBAMOL 500 MG PO TABS: 1000.0000 mg | ORAL_TABLET | Freq: Four times a day (QID) | ORAL | Status: AC | PRN

## 2010-12-30 MED ORDER — FENTANYL CITRATE 0.05 MG/ML IJ SOLN
50.0000 ug | INTRAMUSCULAR | Status: DC | PRN
  Administered 2010-12-30: 50 ug via INTRAVENOUS
  Filled 2010-12-30: qty 2

## 2010-12-30 MED ORDER — CEPHALEXIN 500 MG PO CAPS: 500.0000 mg | ORAL_CAPSULE | Freq: Four times a day (QID) | ORAL | Status: AC

## 2010-12-30 MED ORDER — IOHEXOL 350 MG/ML SOLN
120.0000 mL | Freq: Once | INTRAVENOUS | Status: AC | PRN
  Administered 2010-12-30: 120 mL via INTRAVENOUS

## 2010-12-30 MED ORDER — DIAZEPAM 5 MG PO TABS
5.0000 mg | ORAL_TABLET | Freq: Once | ORAL | Status: AC
  Administered 2010-12-30: 5 mg via ORAL
  Filled 2010-12-30: qty 1

## 2010-12-30 MED ORDER — HYDROCODONE-ACETAMINOPHEN 5-325 MG PO TABS: ORAL_TABLET | ORAL | Status: AC

## 2010-12-30 NOTE — ED Notes (Signed)
C/o mid chest pain radiating in between shoulder blades onset this morning; c/o dizziness; reports chest pain is worse with movement and deep breaths. Placed on cardiac monitor, bp monitoring and continuous pulse ox.  IV access obtained and labs drawn.  Sinus tach rate 105 on monitor.

## 2010-12-30 NOTE — ED Notes (Signed)
Pt c/o mid shoulder blade pain x 4 days. With cp/pressure/sob today.

## 2010-12-30 NOTE — ED Provider Notes (Signed)
History     CSN: [id number] Arrival date & time: 12/30/2010  6:32 PM   Chief Complaint  Patient presents with  . Back Pain  . Shortness of Breath  . Chest Pain   HPI Pt was seen at 2000.   Per pt, c/o gradual onset and persistence of constant left sided thoracic back "pain" over the past several weeks, worse over past several days.  Pt endorses hx of similar pain, hx fibromyalgia.  States the pain radiates into her anterior and left chest areas, and makes her feel "SOB."  Pain worsens with deep breath, palpation of the area, and movement of her torso.  Denies palpitations, no cough, no fevers, no abd pain, no flank pain, no rash, no N/V/D.      Past Medical History  Diagnosis Date  . Fibromyalgia   . Depression   . PTSD (post-traumatic stress disorder)   . Chronic back pain   . Neuropathy     left foot  . Hypertension   . Migraine headache   . Tachycardia     Past Surgical History  Procedure Date  . Back surgery   . Breast surgery   . Cholecystectomy   . Abdominal hysterectomy   . Cesarean section   . Dilation and curettage of uterus     History  Substance Use Topics  . Smoking status: Never Smoker   . Smokeless tobacco: Not on file  . Alcohol Use: Yes     ocassional    Review of Systems ROS: Statement: All systems negative except as marked or noted in the HPI; Constitutional: Negative for fever and chills. ; ; Eyes: Negative for eye pain, redness and discharge. ; ; ENMT: Negative for ear pain, hoarseness, nasal congestion, sinus pressure and sore throat. ; ; Cardiovascular: +CP, SOB.  Negative for palpitations, diaphoresis, and peripheral edema. ; ; Respiratory: Negative for cough, wheezing and stridor. ; ; Gastrointestinal: Negative for nausea, vomiting, diarrhea and abdominal pain, blood in stool, hematemesis, jaundice and rectal bleeding. . ; ; Genitourinary: Negative for dysuria, flank pain and hematuria. ; ; Musculoskeletal: +back pain.  Negative for neck pain.  Negative for swelling and trauma.; ; Skin: Negative for pruritus, rash, abrasions, blisters, bruising and skin lesion.; ; Neuro: Negative for headache, lightheadedness and neck stiffness. Negative for weakness, altered level of consciousness , altered mental status, extremity weakness, paresthesias, involuntary movement, seizure and syncope.     Allergies  Sulfa antibiotics; Bee; Morphine and related; and Latex  Home Medications   Current Outpatient Rx  Name Route Sig Dispense Refill  . ALPRAZOLAM 0.5 MG PO TABS Oral Take 0.5 mg by mouth 4 (four) times daily as needed. anxiety    . DULOXETINE HCL 60 MG PO CPEP Oral Take 60 mg by mouth 2 (two) times daily.      . ETODOLAC 500 MG PO TABS Oral Take 500 mg by mouth 2 (two) times daily.      Marland Kitchen GABAPENTIN 600 MG PO TABS Oral Take 1,200 mg by mouth 2 (two) times daily between meals.      . IBUPROFEN 800 MG PO TABS Oral Take 800 mg by mouth daily as needed. For pain     . PROMETHAZINE HCL 25 MG PO TABS Oral Take 25 mg by mouth every 6 (six) hours as needed. For nausea    . TRAMADOL HCL 50 MG PO TABS Oral Take 50 mg by mouth every 6 (six) hours as needed. pain    . VERAPAMIL  HCL 80 MG PO TABS Oral Take 80 mg by mouth 2 (two) times daily.        Ht 5\' 4"  (1.626 m)  Wt 181 lb (82.101 kg)  BMI 31.07 kg/m2   Physical Exam 2005: Physical examination:  Nursing notes reviewed; Vital signs and O2 SAT reviewed;  Constitutional: Well developed, Well nourished, Well hydrated, Uncomfortable appearing; Head:  Normocephalic, atraumatic; Eyes: EOMI, PERRL, No scleral icterus; ENMT: Mouth and pharynx normal, Mucous membranes moist; Neck: Supple, Full range of motion, No lymphadenopathy; Cardiovascular: Tachycardic, regular rhythm, No murmur, rub, or gallop; Respiratory: Breath sounds clear & equal bilaterally, No rales, rhonchi, wheezes, or rub, Normal respiratory effort/excursion; Chest: +left parasternal and anterior chest wall areas tender to palp, Movement  normal; Abdomen: Soft, Nontender, Nondistended, Normal bowel sounds; Genitourinary: No CVA tenderness; Spine:  No midline CS, TS, LS tenderness.  +TTP left hypertonic thoracic paraspinal muscles, +left hypertonic trapezius muscle TTP. Extremities: Pulses normal, No tenderness, No edema, No calf edema or asymmetry.; Neuro: AA&Ox3, Major CN grossly intact. Speech clear, no facial droop. No gross focal motor or sensory deficits in extremities.; Skin: Color normal, Warm, Dry, no rash, no petechiae. Psych:  Affect flat, anxious.   ED Course  Procedures    MDM  MDM Reviewed: nursing note and vitals Interpretation: labs, ECG, x-ray and CT scan    Date: 12/30/2010  Rate: 106  Rhythm: sinus tachycardia  QRS Axis: normal  Intervals: normal  ST/T Wave abnormalities: normal  Conduction Disutrbances:none  Narrative Interpretation:   Old EKG Reviewed: none available.  Results for orders placed during the hospital encounter of 12/30/10  CBC      Component Value Range   WBC 6.9  4.0 - 10.5 (K/uL)   RBC 4.30  3.87 - 5.11 (MIL/uL)   Hemoglobin 12.9  12.0 - 15.0 (g/dL)   HCT 16.1  09.6 - 04.5 (%)   MCV 89.8  78.0 - 100.0 (fL)   MCH 30.0  26.0 - 34.0 (pg)   MCHC 33.4  30.0 - 36.0 (g/dL)   RDW 40.9  81.1 - 91.4 (%)   Platelets 257  150 - 400 (K/uL)  BASIC METABOLIC PANEL      Component Value Range   Sodium 139  135 - 145 (mEq/L)   Potassium 3.6  3.5 - 5.1 (mEq/L)   Chloride 103  96 - 112 (mEq/L)   CO2 24  19 - 32 (mEq/L)   Glucose, Bld 118 (*) 70 - 99 (mg/dL)   BUN 13  6 - 23 (mg/dL)   Creatinine, Ser 7.82  0.50 - 1.10 (mg/dL)   Calcium 9.5  8.4 - 95.6 (mg/dL)   GFR calc non Af Amer >90  >90 (mL/min)   GFR calc Af Amer >90  >90 (mL/min)  POCT I-STAT TROPONIN I      Component Value Range   Troponin i, poc 0.01  0.00 - 0.08 (ng/mL)   Comment 3           D-DIMER, QUANTITATIVE      Component Value Range   D-Dimer, Quant 0.79 (*) 0.00 - 0.48 (ug/mL-FEU)  URINALYSIS, ROUTINE W REFLEX  MICROSCOPIC      Component Value Range   Color, Urine YELLOW  YELLOW    APPearance CLEAR  CLEAR    Specific Gravity, Urine 1.020  1.005 - 1.030    pH 6.5  5.0 - 8.0    Glucose, UA NEGATIVE  NEGATIVE (mg/dL)   Hgb urine dipstick NEGATIVE  NEGATIVE  Bilirubin Urine SMALL (*) NEGATIVE    Ketones, ur TRACE (*) NEGATIVE (mg/dL)   Protein, ur NEGATIVE  NEGATIVE (mg/dL)   Urobilinogen, UA 0.2  0.0 - 1.0 (mg/dL)   Nitrite POSITIVE (*) NEGATIVE    Leukocytes, UA TRACE (*) NEGATIVE   URINE MICROSCOPIC-ADD ON      Component Value Range   Squamous Epithelial / LPF FEW (*) RARE    WBC, UA 3-6  <3 (WBC/hpf)   Bacteria, UA MANY (*) RARE    Ct Angio Chest W/cm &/or Wo Cm  12/30/2010  *RADIOLOGY REPORT*  Clinical Data:  Midsternal chest pain and back pain.  History of hypertension.  Evaluate for pulmonary embolism and dissection.  CT ANGIOGRAPHY CHEST, ABDOMEN AND PELVIS  Technique:  Multidetector CT imaging through the chest, abdomen and pelvis was performed using the standard protocol during bolus administration of intravenous contrast.  Multiplanar reconstructed images including MIPs were obtained and reviewed to evaluate the vascular anatomy.  Contrast: OMNIPAQUE IOHEXOL 350 MG/ML IV SOLN  Comparison:  Chest radiographs same date.  CTA CHEST  Findings:  The pulmonary arteries are well opacified with contrast. There is no evidence of acute pulmonary embolism.  The thoracic aorta appears normal.  There is no evidence of aneurysm or dissection.  There are no enlarged mediastinal or hilar lymph nodes.  There is no pleural or pericardial effusion.  The lungs are clear.   Review of the MIP images confirms the above findings.  IMPRESSION: No acute chest findings.  No evidence of pulmonary embolism or aortic dissection.  CTA ABDOMEN AND PELVIS  Findings:  The abdominal aorta is normal in caliber.  There is no evidence of aneurysm or dissection.  The visceral arteries appear normal.  There is an accessory  right renal artery.  No retroperitoneal abnormalities are identified.  There is mild hepatomegaly status post cholecystectomy.  No significant biliary dilatation or focal hepatic abnormality is identified.  The spleen, pancreas, adrenal glands and kidneys appear normal as imaged in the arterial phase of contrast administration.  The bowel gas pattern is normal.  The appendix appears normal.  No inflammatory changes or enlarged lymph nodes are identified.  Small right ovarian follicle is noted.  The uterus is surgically absent. The urinary bladder appears unremarkable.  No acute osseous findings are demonstrated.   Review of the MIP images confirms the above findings.  IMPRESSION:  No acute abdominal pelvic findings.  No evidence of dissection or aneurysm.  Mild hepatomegaly status post cholecystectomy.  Original Report Authenticated By: Gerrianne Scale, M.D.   Chest Portable 1 View  12/30/2010  *RADIOLOGY REPORT*  Clinical Data: Upper back pain for 4 days, new onset chest pain and shortness of breath  PORTABLE CHEST - 1 VIEW  Comparison: Portable exam 1848 hours without priors for comparison.  Findings: Normal heart size, mediastinal contours, and pulmonary vascularity. Low lung volumes with minimal right basilar atelectasis. Lungs otherwise clear. No pleural effusion or pneumothorax. No acute osseous findings.  IMPRESSION: Low lung volumes with minimal right basilar atelectasis.  Original Report Authenticated By: Lollie Marrow, M.D.    Ct Angio Abd/pel W/ And/or W/o  12/30/2010  *RADIOLOGY REPORT*  Clinical Data:  Midsternal chest pain and back pain.  History of hypertension.  Evaluate for pulmonary embolism and dissection.  CT ANGIOGRAPHY CHEST, ABDOMEN AND PELVIS  Technique:  Multidetector CT imaging through the chest, abdomen and pelvis was performed using the standard protocol during bolus administration of intravenous contrast.  Multiplanar  reconstructed images including MIPs were obtained and  reviewed to evaluate the vascular anatomy.  Contrast: OMNIPAQUE IOHEXOL 350 MG/ML IV SOLN  Comparison:  Chest radiographs same date.  CTA CHEST  Findings:  The pulmonary arteries are well opacified with contrast. There is no evidence of acute pulmonary embolism.  The thoracic aorta appears normal.  There is no evidence of aneurysm or dissection.  There are no enlarged mediastinal or hilar lymph nodes.  There is no pleural or pericardial effusion.  The lungs are clear.   Review of the MIP images confirms the above findings.  IMPRESSION: No acute chest findings.  No evidence of pulmonary embolism or aortic dissection.  CTA ABDOMEN AND PELVIS  Findings:  The abdominal aorta is normal in caliber.  There is no evidence of aneurysm or dissection.  The visceral arteries appear normal.  There is an accessory right renal artery.  No retroperitoneal abnormalities are identified.  There is mild hepatomegaly status post cholecystectomy.  No significant biliary dilatation or focal hepatic abnormality is identified.  The spleen, pancreas, adrenal glands and kidneys appear normal as imaged in the arterial phase of contrast administration.  The bowel gas pattern is normal.  The appendix appears normal.  No inflammatory changes or enlarged lymph nodes are identified.  Small right ovarian follicle is noted.  The uterus is surgically absent. The urinary bladder appears unremarkable.  No acute osseous findings are demonstrated.   Review of the MIP images confirms the above findings.  IMPRESSION:  No acute abdominal pelvic findings.  No evidence of dissection or aneurysm.  Mild hepatomegaly status post cholecystectomy.  Original Report Authenticated By: Gerrianne Scale, M.D.     9:59 PM:  Feels improved after meds and wants to go home now.  HR 90's, Sats 100% R/A.  Resps without distress, mentating per baseline.  Has tol PO well while in ED.  Appears likely msk pain at this time, has hx of fibromyalgia.  Pt requesting if  "you can say it might be a flair up of my fibromyalgia because I'm trying to get disability."  Pt informed that she will need to go to her PMD for that, as we do not complete disability forms in the ED.  Verb understanding.  Will tx for UTI with keflex.  Dx testing d/w pt and friend.  Questions answered.  Verb understanding, agreeable to d/c home with outpt f/u.       , Allison Quarry, DO 12/31/10 2357

## 2011-01-02 LAB — URINE CULTURE: Culture  Setup Time: 201212050146

## 2011-01-04 NOTE — ED Notes (Signed)
+   urine culture. Treated with Keflex, sensitive to same per protocol MD. 

## 2011-01-29 ENCOUNTER — Encounter (HOSPITAL_COMMUNITY): Payer: Self-pay | Admitting: Oncology

## 2011-01-29 ENCOUNTER — Emergency Department (HOSPITAL_COMMUNITY)
Admission: EM | Admit: 2011-01-29 | Discharge: 2011-01-29 | Disposition: A | Discharge status: Home / Self Care | Payer: Self-pay | Attending: Emergency Medicine | Admitting: Emergency Medicine

## 2011-01-29 DIAGNOSIS — R059 Cough, unspecified: Secondary | ICD-10-CM | POA: Insufficient documentation

## 2011-01-29 DIAGNOSIS — R05 Cough: Secondary | ICD-10-CM | POA: Insufficient documentation

## 2011-01-29 DIAGNOSIS — F3289 Other specified depressive episodes: Secondary | ICD-10-CM | POA: Insufficient documentation

## 2011-01-29 DIAGNOSIS — IMO0001 Reserved for inherently not codable concepts without codable children: Secondary | ICD-10-CM | POA: Insufficient documentation

## 2011-01-29 DIAGNOSIS — H6690 Otitis media, unspecified, unspecified ear: Secondary | ICD-10-CM | POA: Insufficient documentation

## 2011-01-29 DIAGNOSIS — R509 Fever, unspecified: Secondary | ICD-10-CM | POA: Insufficient documentation

## 2011-01-29 DIAGNOSIS — F329 Major depressive disorder, single episode, unspecified: Secondary | ICD-10-CM | POA: Insufficient documentation

## 2011-01-29 DIAGNOSIS — R110 Nausea: Secondary | ICD-10-CM | POA: Insufficient documentation

## 2011-01-29 DIAGNOSIS — I10 Essential (primary) hypertension: Secondary | ICD-10-CM | POA: Insufficient documentation

## 2011-01-29 DIAGNOSIS — R070 Pain in throat: Secondary | ICD-10-CM | POA: Insufficient documentation

## 2011-01-29 DIAGNOSIS — Z79899 Other long term (current) drug therapy: Secondary | ICD-10-CM | POA: Insufficient documentation

## 2011-01-29 DIAGNOSIS — B349 Viral infection, unspecified: Secondary | ICD-10-CM

## 2011-01-29 DIAGNOSIS — J3489 Other specified disorders of nose and nasal sinuses: Secondary | ICD-10-CM | POA: Insufficient documentation

## 2011-01-29 DIAGNOSIS — B9789 Other viral agents as the cause of diseases classified elsewhere: Secondary | ICD-10-CM | POA: Insufficient documentation

## 2011-01-29 MED ORDER — IBUPROFEN 800 MG PO TABS
800.0000 mg | ORAL_TABLET | Freq: Once | ORAL | Status: AC
  Administered 2011-01-29: 800 mg via ORAL
  Filled 2011-01-29: qty 1

## 2011-01-29 MED ORDER — ACETAMINOPHEN 500 MG PO TABS
1000.0000 mg | ORAL_TABLET | Freq: Once | ORAL | Status: AC
  Administered 2011-01-29: 1000 mg via ORAL
  Filled 2011-01-29: qty 2

## 2011-01-29 MED ORDER — OSELTAMIVIR PHOSPHATE 75 MG PO CAPS: 75.0000 mg | ORAL_CAPSULE | Freq: Two times a day (BID) | ORAL | Status: AC

## 2011-01-29 MED ORDER — GUAIFENESIN-CODEINE 100-10 MG/5ML PO SYRP: 10.0000 mL | ORAL_SOLUTION | Freq: Three times a day (TID) | ORAL | Status: AC | PRN

## 2011-01-29 MED ORDER — AMOXICILLIN 500 MG PO CAPS: 500.0000 mg | ORAL_CAPSULE | Freq: Three times a day (TID) | ORAL | Status: AC

## 2011-01-29 NOTE — ED Provider Notes (Signed)
History     CSN: [id number]  Arrival date & time 01/29/11  4098   First MD Initiated Contact with Patient 01/29/11 1001      Chief Complaint  Patient presents with  . Cough  . Generalized Body Aches  . Fever    (Consider location/radiation/quality/duration/timing/severity/associated sxs/prior treatment) Patient is a 46 y.o. female presenting with flu symptoms. The history is provided by the patient.  Influenza This is a new problem. The current episode started yesterday. The problem occurs constantly. The problem has been unchanged. Associated symptoms include chills, congestion, coughing, a fever, myalgias, nausea and a sore throat. Pertinent negatives include no abdominal pain, arthralgias, chest pain, neck pain, numbness, rash, urinary symptoms, vomiting or weakness. The symptoms are aggravated by nothing. She has tried nothing for the symptoms. The treatment provided no relief.    Past Medical History  Diagnosis Date  . Fibromyalgia   . Depression   . PTSD (post-traumatic stress disorder)   . Chronic back pain   . Neuropathy     left foot  . Hypertension   . Migraine headache   . Tachycardia     Past Surgical History  Procedure Date  . Back surgery   . Breast surgery   . Cholecystectomy   . Abdominal hysterectomy   . Cesarean section   . Dilation and curettage of uterus     History reviewed. No pertinent family history.  History  Substance Use Topics  . Smoking status: Never Smoker   . Smokeless tobacco: Not on file  . Alcohol Use: Yes     ocassional    OB History    Grav Para Term Preterm Abortions TAB SAB Ect Mult Living                  Review of Systems  Constitutional: Positive for fever and chills.  HENT: Positive for congestion, sore throat and rhinorrhea. Negative for trouble swallowing, neck pain and neck stiffness.   Respiratory: Positive for cough. Negative for shortness of breath and wheezing.   Cardiovascular: Negative for chest pain  and palpitations.  Gastrointestinal: Positive for nausea. Negative for vomiting and abdominal pain.  Genitourinary: Negative for dysuria, hematuria and flank pain.  Musculoskeletal: Positive for myalgias. Negative for back pain and arthralgias.  Skin: Negative.  Negative for rash.  Neurological: Negative for dizziness, weakness and numbness.  Hematological: Does not bruise/bleed easily.  All other systems reviewed and are negative.    Allergies  Sulfa antibiotics; Bee; Morphine and related; and Latex  Home Medications   Current Outpatient Rx  Name Route Sig Dispense Refill  . ALPRAZOLAM 0.5 MG PO TABS Oral Take 0.5 mg by mouth 4 (four) times daily as needed. anxiety    . GABAPENTIN 600 MG PO TABS Oral Take 1,200 mg by mouth 2 (two) times daily between meals.      . DULOXETINE HCL 60 MG PO CPEP Oral Take 60 mg by mouth 2 (two) times daily.      Marland Kitchen PROMETHAZINE HCL 25 MG PO TABS Oral Take 25 mg by mouth every 6 (six) hours as needed. For nausea    . TRAMADOL HCL 50 MG PO TABS Oral Take 50 mg by mouth every 6 (six) hours as needed. pain    . VERAPAMIL HCL 80 MG PO TABS Oral Take 80 mg by mouth 2 (two) times daily.        BP 165/98  Pulse 111  Temp(Src) 101.2 F (38.4 C) (Oral)  Resp  18  Ht 5\' 4"  (1.626 m)  Wt 184 lb (83.462 kg)  BMI 31.58 kg/m2  SpO2 98%  Physical Exam  Nursing note and vitals reviewed. Constitutional: She is oriented to person, place, and time. She appears well-developed and well-nourished. No distress.  HENT:  Head: Normocephalic and atraumatic.  Mouth/Throat: Oropharynx is clear and moist.  Neck: Normal range of motion. Neck supple.  Cardiovascular: Normal rate, regular rhythm and normal heart sounds.   Pulmonary/Chest: Effort normal and breath sounds normal. No respiratory distress. She has no wheezes. She has no rales. She exhibits no tenderness.  Musculoskeletal: Normal range of motion. She exhibits no edema and no tenderness.  Lymphadenopathy:    She  has no cervical adenopathy.  Neurological: She is alert and oriented to person, place, and time. No cranial nerve deficit. She exhibits normal muscle tone. Coordination normal.  Skin: Skin is warm and dry.    ED Course  Procedures (including critical care time)       MDM    11:34 AM vitals improved.  Mucous membranes are moist, lungs are CTA bilaterally.  Non-toxic appearing.  No meningeal signs.  Sudden onset of sx's likely viral illness vs influenza.  Patient requests rx for tamiflu.           L. [place], [place] 01/30/11 2216

## 2011-01-29 NOTE — ED Notes (Signed)
Pt reports cough, generalized body aches, fevers x 2 days - has not taken anything for her symptoms.

## 2011-01-31 NOTE — ED Provider Notes (Signed)
Medical screening examination/treatment/procedure(s) were performed by non-physician practitioner and as supervising physician I was immediately available for consultation/collaboration.   Shelda Jakes, MD 01/31/11 [phone]

## 2011-02-07 ENCOUNTER — Emergency Department (HOSPITAL_COMMUNITY)
Admission: EM | Admit: 2011-02-07 | Discharge: 2011-02-07 | Disposition: A | Discharge status: Home / Self Care | Payer: Self-pay | Attending: Emergency Medicine | Admitting: Emergency Medicine

## 2011-02-07 ENCOUNTER — Emergency Department (HOSPITAL_COMMUNITY): Payer: Self-pay

## 2011-02-07 ENCOUNTER — Encounter (HOSPITAL_COMMUNITY): Payer: Self-pay

## 2011-02-07 DIAGNOSIS — F329 Major depressive disorder, single episode, unspecified: Secondary | ICD-10-CM | POA: Insufficient documentation

## 2011-02-07 DIAGNOSIS — R110 Nausea: Secondary | ICD-10-CM | POA: Insufficient documentation

## 2011-02-07 DIAGNOSIS — Z9079 Acquired absence of other genital organ(s): Secondary | ICD-10-CM | POA: Insufficient documentation

## 2011-02-07 DIAGNOSIS — F3289 Other specified depressive episodes: Secondary | ICD-10-CM | POA: Insufficient documentation

## 2011-02-07 DIAGNOSIS — R197 Diarrhea, unspecified: Secondary | ICD-10-CM | POA: Insufficient documentation

## 2011-02-07 DIAGNOSIS — F4310 Post-traumatic stress disorder, unspecified: Secondary | ICD-10-CM | POA: Insufficient documentation

## 2011-02-07 DIAGNOSIS — F29 Unspecified psychosis not due to a substance or known physiological condition: Secondary | ICD-10-CM | POA: Insufficient documentation

## 2011-02-07 DIAGNOSIS — IMO0001 Reserved for inherently not codable concepts without codable children: Secondary | ICD-10-CM | POA: Insufficient documentation

## 2011-02-07 DIAGNOSIS — R5381 Other malaise: Secondary | ICD-10-CM | POA: Insufficient documentation

## 2011-02-07 DIAGNOSIS — G5790 Unspecified mononeuropathy of unspecified lower limb: Secondary | ICD-10-CM | POA: Insufficient documentation

## 2011-02-07 DIAGNOSIS — R51 Headache: Secondary | ICD-10-CM | POA: Insufficient documentation

## 2011-02-07 DIAGNOSIS — G8929 Other chronic pain: Secondary | ICD-10-CM | POA: Insufficient documentation

## 2011-02-07 DIAGNOSIS — M549 Dorsalgia, unspecified: Secondary | ICD-10-CM | POA: Insufficient documentation

## 2011-02-07 DIAGNOSIS — J069 Acute upper respiratory infection, unspecified: Secondary | ICD-10-CM | POA: Insufficient documentation

## 2011-02-07 DIAGNOSIS — Z9889 Other specified postprocedural states: Secondary | ICD-10-CM | POA: Insufficient documentation

## 2011-02-07 DIAGNOSIS — I10 Essential (primary) hypertension: Secondary | ICD-10-CM | POA: Insufficient documentation

## 2011-02-07 LAB — CBC
HCT: 41.8 % (ref 36.0–46.0)
Hemoglobin: 13.8 g/dL (ref 12.0–15.0)
MCH: 28.6 pg (ref 26.0–34.0)
MCHC: 33 g/dL (ref 30.0–36.0)
MCV: 86.5 fL (ref 78.0–100.0)
Platelets: 390 10*3/uL (ref 150–400)
RBC: 4.83 MIL/uL (ref 3.87–5.11)
RDW: 11.9 % (ref 11.5–15.5)
WBC: 9.4 10*3/uL (ref 4.0–10.5)

## 2011-02-07 LAB — DIFFERENTIAL
Basophils Absolute: 0 10*3/uL (ref 0.0–0.1)
Eosinophils Relative: 1 % (ref 0–5)
Lymphocytes Relative: 28 % (ref 12–46)
Monocytes Absolute: 0.5 10*3/uL (ref 0.1–1.0)
Monocytes Relative: 5 % (ref 3–12)

## 2011-02-07 LAB — URINALYSIS, ROUTINE W REFLEX MICROSCOPIC
Bilirubin Urine: NEGATIVE
Glucose, UA: NEGATIVE mg/dL
Hgb urine dipstick: NEGATIVE
Ketones, ur: NEGATIVE mg/dL
Leukocytes, UA: NEGATIVE
Nitrite: NEGATIVE
Protein, ur: NEGATIVE mg/dL
Specific Gravity, Urine: 1.01 (ref 1.005–1.030)
Urobilinogen, UA: 0.2 mg/dL (ref 0.0–1.0)
pH: 7.5 (ref 5.0–8.0)

## 2011-02-07 LAB — BASIC METABOLIC PANEL
BUN: 7 mg/dL (ref 6–23)
CO2: 30 mEq/L (ref 19–32)
Calcium: 9.5 mg/dL (ref 8.4–10.5)
Creatinine, Ser: 0.63 mg/dL (ref 0.50–1.10)

## 2011-02-07 MED ORDER — HYDROMORPHONE HCL PF 1 MG/ML IJ SOLN
1.0000 mg | Freq: Once | INTRAMUSCULAR | Status: AC
  Administered 2011-02-07: 1 mg via INTRAVENOUS
  Filled 2011-02-07: qty 1

## 2011-02-07 MED ORDER — GUAIFENESIN-CODEINE 100-10 MG/5ML PO SYRP: ORAL_SOLUTION | ORAL | Status: DC

## 2011-02-07 MED ORDER — SODIUM CHLORIDE 0.9 % IV BOLUS (SEPSIS)
1000.0000 mL | Freq: Once | INTRAVENOUS | Status: AC
  Administered 2011-02-07: 1000 mL via INTRAVENOUS
  Filled 2011-02-07: qty 1000

## 2011-02-07 MED ORDER — ONDANSETRON HCL 4 MG/2ML IJ SOLN
4.0000 mg | Freq: Once | INTRAMUSCULAR | Status: AC
  Administered 2011-02-07: 4 mg via INTRAVENOUS
  Filled 2011-02-07: qty 2

## 2011-02-07 MED ORDER — PROMETHAZINE HCL 12.5 MG PO TABS: ORAL_TABLET | ORAL | Status: DC

## 2011-02-07 NOTE — ED Provider Notes (Signed)
History     CSN: [id number]  Arrival date & time 02/07/11  1245   First MD Initiated Contact with Patient 02/07/11 1424      Chief Complaint  Patient presents with  . Cough  . Otalgia  . Nausea  . Weakness  . Generalized Body Aches    (Consider location/radiation/quality/duration/timing/severity/associated sxs/prior treatment) HPI Comments: States she was seen here on 01-29-11 and dx with flu and an otitis media.  She couldn't afford the tamiflu but did take the amoxicillin and has gotten no better.  She continues to cough, has had nausea but no vomiting.  She has had daily episodes of diarrhea since last seen.  Appetite is poor and she is drinking shat she can but states her urine is very concentrated and she feels weak and achy all over.  She also feels "confused and seem to find the right words to express myself".  She does answer all questions appropriately is alert and oriented.  Patient is a 46 y.o. female presenting with cough, ear pain, and weakness. The history is provided by the patient. No language interpreter was used.  Cough This is a new problem. Episode onset: ~ 2 weeks ago. The problem occurs constantly. The problem has been gradually worsening. The cough is non-productive. The maximum temperature recorded prior to her arrival was 100 to 100.9 F. Associated symptoms include ear pain, sore throat and myalgias. She is not a smoker.  Otalgia Associated symptoms include sore throat, diarrhea and cough. Pertinent negatives include no vomiting.  Weakness The primary symptoms include nausea. Primary symptoms do not include vomiting.  Additional symptoms include weakness.    Past Medical History  Diagnosis Date  . Fibromyalgia   . Depression   . PTSD (post-traumatic stress disorder)   . Chronic back pain   . Neuropathy     left foot  . Hypertension   . Migraine headache   . Tachycardia     Past Surgical History  Procedure Date  . Back surgery   . Breast surgery     . Cholecystectomy   . Abdominal hysterectomy   . Cesarean section   . Dilation and curettage of uterus     No family history on file.  History  Substance Use Topics  . Smoking status: Never Smoker   . Smokeless tobacco: Not on file  . Alcohol Use: Yes     ocassional    OB History    Grav Para Term Preterm Abortions TAB SAB Ect Mult Living                  Review of Systems  Constitutional: Positive for appetite change.  HENT: Positive for ear pain and sore throat.   Respiratory: Positive for cough.   Gastrointestinal: Positive for nausea and diarrhea. Negative for vomiting.  Musculoskeletal: Positive for myalgias.  Neurological: Positive for weakness.    Allergies  Sulfa antibiotics; Bee; Morphine and related; and Latex  Home Medications   Current Outpatient Rx  Name Route Sig Dispense Refill  . ALPRAZOLAM 0.5 MG PO TABS Oral Take 0.5 mg by mouth 4 (four) times daily as needed. anxiety    . AMOXICILLIN 500 MG PO CAPS Oral Take 1 capsule (500 mg total) by mouth 3 (three) times daily. For 10 days 30 capsule 0  . DULOXETINE HCL 60 MG PO CPEP Oral Take 60 mg by mouth 2 (two) times daily.      Marland Kitchen GABAPENTIN 600 MG PO TABS Oral Take 1,200  mg by mouth 2 (two) times daily between meals.      . GUAIFENESIN-CODEINE 100-10 MG/5ML PO SYRP Oral Take 10 mLs by mouth 3 (three) times daily as needed for cough. 120 mL 0  . OSELTAMIVIR PHOSPHATE 75 MG PO CAPS Oral Take 1 capsule (75 mg total) by mouth 2 (two) times daily. For 5 days 10 capsule 0  . PROMETHAZINE HCL 25 MG PO TABS Oral Take 25 mg by mouth every 6 (six) hours as needed. For nausea    . TRAMADOL HCL 50 MG PO TABS Oral Take 50 mg by mouth every 6 (six) hours as needed. pain    . VERAPAMIL HCL 80 MG PO TABS Oral Take 80 mg by mouth 2 (two) times daily.        BP 178/102  Pulse 88  Temp(Src) 98.1 F (36.7 C) (Oral)  Resp 20  SpO2 99%  Physical Exam  Nursing note and vitals reviewed. Constitutional: She is oriented  to person, place, and time. She appears well-developed and well-nourished. She is cooperative. No distress.  HENT:  Head: Normocephalic and atraumatic.  Right Ear: External ear normal.  Left Ear: External ear normal.  Nose: Nose normal.  Mouth/Throat: Mucous membranes are dry. No oropharyngeal exudate.  Eyes: EOM are normal.  Neck: Normal range of motion.  Cardiovascular: Normal rate, regular rhythm and normal heart sounds.   Pulmonary/Chest: Effort normal and breath sounds normal. No accessory muscle usage. Not tachypneic. No respiratory distress. She has no decreased breath sounds.  Abdominal: Soft. She exhibits no distension. There is no tenderness. There is no rigidity, no rebound and no guarding.  Musculoskeletal: Normal range of motion.  Neurological: She is alert and oriented to person, place, and time. She has normal strength. No cranial nerve deficit or sensory deficit. Coordination and gait normal. GCS eye subscore is 4. GCS verbal subscore is 5. GCS motor subscore is 6.       Alert and oriented to time, place and circumstance.  Skin: Skin is warm and dry.  Psychiatric: She has a normal mood and affect. Judgment normal.    ED Course  Procedures (including critical care time)   Labs Reviewed  CBC  DIFFERENTIAL  BASIC METABOLIC PANEL  URINALYSIS, ROUTINE W REFLEX MICROSCOPIC   Dg Chest 2 View  02/07/2011  *RADIOLOGY REPORT*  Clinical Data: Cough.  Flu like symptoms since December 20.  CHEST - 2 VIEW  Comparison: Chest x-ray 12/30/2010, chest CT 12/30/2010  Findings: Cardiomediastinal silhouette is within normal limits. There is minimal density at the lateral right lung base, most consistent with atelectasis or mild pleural thickening.  No evidence for focal consolidation or pleural effusion.  No edema. Visualized osseous structures have a normal appearance.  IMPRESSION: Right lower lobe atelectasis or mild pleural thickening.  Original Report Authenticated By: Patterson Hammersmith, M.D.     No diagnosis found.    MDM   4:34 PM Pt states she nausea is gone and she is feeling much better after receiving fluids.       Worthy Rancher, PA 02/07/11 1624  Worthy Rancher, PA 02/07/11 [phone]

## 2011-02-07 NOTE — ED Notes (Signed)
Pt c/o severe pain in her head. EDPa Miller notified.

## 2011-02-07 NOTE — ED Notes (Signed)
Discharge on hold. Pt going for non contrast CT of the head.

## 2011-02-07 NOTE — ED Notes (Signed)
Pt presents with cough, nausea, ear pain, weakness, generalized body aches since 01/29/2011. Pt states she was seen here but medications have not improved symptoms.

## 2011-02-08 NOTE — ED Provider Notes (Signed)
Medical screening examination/treatment/procedure(s) were performed by non-physician practitioner and as supervising physician I was immediately available for consultation/collaboration.   S. , MD 02/08/11 2234 

## 2011-03-29 ENCOUNTER — Inpatient Hospital Stay (HOSPITAL_COMMUNITY)
Admission: EM | Admit: 2011-03-29 | Discharge: 2011-04-01 | DRG: 918 | Payer: Medicaid Other | Attending: Internal Medicine | Admitting: Internal Medicine

## 2011-03-29 ENCOUNTER — Encounter (HOSPITAL_COMMUNITY): Payer: Self-pay | Admitting: *Deleted

## 2011-03-29 ENCOUNTER — Emergency Department (HOSPITAL_COMMUNITY): Payer: Medicaid Other

## 2011-03-29 ENCOUNTER — Other Ambulatory Visit: Payer: Self-pay

## 2011-03-29 DIAGNOSIS — T1491XA Suicide attempt, initial encounter: Secondary | ICD-10-CM | POA: Diagnosis present

## 2011-03-29 DIAGNOSIS — R45851 Suicidal ideations: Secondary | ICD-10-CM

## 2011-03-29 DIAGNOSIS — G8929 Other chronic pain: Secondary | ICD-10-CM | POA: Diagnosis present

## 2011-03-29 DIAGNOSIS — R930 Abnormal findings on diagnostic imaging of skull and head, not elsewhere classified: Secondary | ICD-10-CM | POA: Diagnosis present

## 2011-03-29 DIAGNOSIS — Z79899 Other long term (current) drug therapy: Secondary | ICD-10-CM

## 2011-03-29 DIAGNOSIS — T50992A Poisoning by other drugs, medicaments and biological substances, intentional self-harm, initial encounter: Secondary | ICD-10-CM | POA: Diagnosis present

## 2011-03-29 DIAGNOSIS — IMO0001 Reserved for inherently not codable concepts without codable children: Secondary | ICD-10-CM | POA: Diagnosis present

## 2011-03-29 DIAGNOSIS — T50901A Poisoning by unspecified drugs, medicaments and biological substances, accidental (unintentional), initial encounter: Secondary | ICD-10-CM

## 2011-03-29 DIAGNOSIS — T424X4A Poisoning by benzodiazepines, undetermined, initial encounter: Principal | ICD-10-CM | POA: Diagnosis present

## 2011-03-29 DIAGNOSIS — F411 Generalized anxiety disorder: Secondary | ICD-10-CM | POA: Diagnosis present

## 2011-03-29 DIAGNOSIS — I10 Essential (primary) hypertension: Secondary | ICD-10-CM | POA: Diagnosis present

## 2011-03-29 DIAGNOSIS — T426X1A Poisoning by other antiepileptic and sedative-hypnotic drugs, accidental (unintentional), initial encounter: Secondary | ICD-10-CM | POA: Diagnosis present

## 2011-03-29 DIAGNOSIS — F3289 Other specified depressive episodes: Secondary | ICD-10-CM | POA: Diagnosis present

## 2011-03-29 DIAGNOSIS — M797 Fibromyalgia: Secondary | ICD-10-CM | POA: Diagnosis present

## 2011-03-29 DIAGNOSIS — M25519 Pain in unspecified shoulder: Secondary | ICD-10-CM | POA: Diagnosis present

## 2011-03-29 DIAGNOSIS — F4310 Post-traumatic stress disorder, unspecified: Secondary | ICD-10-CM | POA: Diagnosis present

## 2011-03-29 DIAGNOSIS — T43502A Poisoning by unspecified antipsychotics and neuroleptics, intentional self-harm, initial encounter: Secondary | ICD-10-CM | POA: Diagnosis present

## 2011-03-29 DIAGNOSIS — R0789 Other chest pain: Secondary | ICD-10-CM | POA: Diagnosis not present

## 2011-03-29 DIAGNOSIS — R4182 Altered mental status, unspecified: Secondary | ICD-10-CM | POA: Diagnosis present

## 2011-03-29 DIAGNOSIS — G43909 Migraine, unspecified, not intractable, without status migrainosus: Secondary | ICD-10-CM | POA: Diagnosis present

## 2011-03-29 DIAGNOSIS — T424X1A Poisoning by benzodiazepines, accidental (unintentional), initial encounter: Secondary | ICD-10-CM

## 2011-03-29 DIAGNOSIS — F329 Major depressive disorder, single episode, unspecified: Secondary | ICD-10-CM | POA: Diagnosis present

## 2011-03-29 DIAGNOSIS — G5790 Unspecified mononeuropathy of unspecified lower limb: Secondary | ICD-10-CM | POA: Diagnosis present

## 2011-03-29 DIAGNOSIS — T438X2A Poisoning by other psychotropic drugs, intentional self-harm, initial encounter: Secondary | ICD-10-CM | POA: Diagnosis present

## 2011-03-29 DIAGNOSIS — Z23 Encounter for immunization: Secondary | ICD-10-CM

## 2011-03-29 LAB — URINALYSIS, ROUTINE W REFLEX MICROSCOPIC
Bilirubin Urine: NEGATIVE
Glucose, UA: NEGATIVE mg/dL
Ketones, ur: NEGATIVE mg/dL
Leukocytes, UA: NEGATIVE
Nitrite: NEGATIVE
Protein, ur: NEGATIVE mg/dL

## 2011-03-29 LAB — DIFFERENTIAL
Basophils Absolute: 0 10*3/uL (ref 0.0–0.1)
Basophils Relative: 0 % (ref 0–1)
Eosinophils Relative: 1 % (ref 0–5)
Monocytes Absolute: 0.5 10*3/uL (ref 0.1–1.0)
Monocytes Relative: 7 % (ref 3–12)

## 2011-03-29 LAB — CBC
HCT: 39.7 % (ref 36.0–46.0)
MCH: 29.1 pg (ref 26.0–34.0)
MCHC: 32.7 g/dL (ref 30.0–36.0)
MCV: 89 fL (ref 78.0–100.0)
RDW: 12.9 % (ref 11.5–15.5)

## 2011-03-29 LAB — SALICYLATE LEVEL: Salicylate Lvl: <2 mg/dL — ABNORMAL LOW (ref 2.8–20.0)

## 2011-03-29 LAB — BASIC METABOLIC PANEL
BUN: 17 mg/dL (ref 6–23)
Calcium: 9.1 mg/dL (ref 8.4–10.5)
Creatinine, Ser: 0.73 mg/dL (ref 0.50–1.10)
GFR calc Af Amer: >90 mL/min (ref >90)

## 2011-03-29 LAB — ETHANOL: Alcohol, Ethyl (B): <11 mg/dL (ref 0–11)

## 2011-03-29 LAB — RAPID URINE DRUG SCREEN, HOSP PERFORMED
Amphetamines: NOT DETECTED
Barbiturates: POSITIVE — AB
Benzodiazepines: POSITIVE — AB

## 2011-03-29 MED ORDER — VERAPAMIL HCL 80 MG PO TABS
80.0000 mg | ORAL_TABLET | Freq: Two times a day (BID) | ORAL | Status: DC
  Administered 2011-03-29 – 2011-04-01 (×6): 80 mg via ORAL
  Filled 2011-03-29 (×6): qty 1

## 2011-03-29 MED ORDER — SODIUM CHLORIDE 0.9 % IJ SOLN
3.0000 mL | Freq: Two times a day (BID) | INTRAMUSCULAR | Status: DC
  Administered 2011-03-29 – 2011-03-30 (×2): 3 mL via INTRAVENOUS
  Filled 2011-03-29 (×2): qty 3

## 2011-03-29 MED ORDER — GABAPENTIN 800 MG PO TABS: 800.0000 mg | ORAL_TABLET | Freq: Two times a day (BID) | ORAL | Status: DC

## 2011-03-29 MED ORDER — TRAMADOL HCL 50 MG PO TABS
50.0000 mg | ORAL_TABLET | Freq: Four times a day (QID) | ORAL | Status: DC | PRN
  Administered 2011-03-30 – 2011-03-31 (×2): 50 mg via ORAL
  Filled 2011-03-29 (×2): qty 1

## 2011-03-29 MED ORDER — GABAPENTIN 600 MG PO TABS
600.0000 mg | ORAL_TABLET | Freq: Two times a day (BID) | ORAL | Status: DC
  Filled 2011-03-29 (×4): qty 1

## 2011-03-29 MED ORDER — ACETAMINOPHEN 325 MG PO TABS
650.0000 mg | ORAL_TABLET | Freq: Four times a day (QID) | ORAL | Status: DC | PRN
  Administered 2011-03-29 – 2011-03-30 (×2): 650 mg via ORAL
  Filled 2011-03-29 (×2): qty 2

## 2011-03-29 MED ORDER — NALOXONE HCL 1 MG/ML IJ SOLN
INTRAMUSCULAR | Status: AC
  Administered 2011-03-29: 1 mg
  Filled 2011-03-29: qty 2

## 2011-03-29 MED ORDER — SODIUM CHLORIDE 0.9 % IV SOLN
INTRAVENOUS | Status: DC
  Administered 2011-03-29 – 2011-03-30 (×2): via INTRAVENOUS

## 2011-03-29 MED ORDER — ONDANSETRON HCL 4 MG PO TABS
4.0000 mg | ORAL_TABLET | Freq: Four times a day (QID) | ORAL | Status: DC | PRN
  Administered 2011-03-30 – 2011-03-31 (×2): 4 mg via ORAL
  Filled 2011-03-29 (×2): qty 1

## 2011-03-29 MED ORDER — GABAPENTIN 400 MG PO CAPS
1600.0000 mg | ORAL_CAPSULE | Freq: Two times a day (BID) | ORAL | Status: DC
  Administered 2011-03-29: 800 mg via ORAL
  Administered 2011-03-30: 1600 mg via ORAL
  Filled 2011-03-29: qty 4

## 2011-03-29 MED ORDER — ENOXAPARIN SODIUM 40 MG/0.4ML ~~LOC~~ SOLN
40.0000 mg | SUBCUTANEOUS | Status: DC
  Administered 2011-03-29 – 2011-03-31 (×3): 40 mg via SUBCUTANEOUS
  Filled 2011-03-29 (×3): qty 0.4

## 2011-03-29 MED ORDER — SODIUM CHLORIDE 0.9 % IJ SOLN
INTRAMUSCULAR | Status: AC
  Administered 2011-03-29: 3 mL
  Filled 2011-03-29: qty 3

## 2011-03-29 MED ORDER — NALOXONE HCL 0.4 MG/ML IJ SOLN: 1.0000 mg | Freq: Once | INTRAMUSCULAR | Status: DC

## 2011-03-29 MED ORDER — ACETAMINOPHEN 650 MG RE SUPP: 650.0000 mg | Freq: Four times a day (QID) | RECTAL | Status: DC | PRN

## 2011-03-29 MED ORDER — GABAPENTIN 400 MG PO CAPS
800.0000 mg | ORAL_CAPSULE | Freq: Two times a day (BID) | ORAL | Status: DC
  Administered 2011-03-29: 800 mg via ORAL
  Filled 2011-03-29 (×2): qty 2

## 2011-03-29 MED ORDER — ONDANSETRON HCL 4 MG/2ML IJ SOLN: 4.0000 mg | Freq: Four times a day (QID) | INTRAMUSCULAR | Status: DC | PRN

## 2011-03-29 NOTE — ED Notes (Signed)
MD at bedside. 

## 2011-03-29 NOTE — ED Provider Notes (Signed)
History    Scribed for Benny Lennert, MD, the patient was seen in room APA16A/APA16A. This chart was scribed by Katha Cabal.   Pt was seen at 3:11 PM    CSN: [id number]  Arrival date & time 03/29/11  1426   First MD Initiated Contact with Patient 03/29/11 1511      Chief complaint altered mental status  (Consider location/radiation/quality/duration/timing/severity/associated sxs/prior treatment) Patient is a 46 y.o. female presenting with altered mental status. The history is provided by the patient (pt is lethargic but states she took meds one hour before coming to er). No language interpreter was used.  Altered Mental Status This is a new problem. The current episode started less than 1 hour ago. The problem occurs constantly. The problem has not changed since onset.Pertinent negatives include no abdominal pain. The symptoms are aggravated by nothing. The symptoms are relieved by nothing. The treatment provided mild relief.    Level V Caveat applies for AMS.   Ashley [name] is a 46 y.o. female who presents to the Emergency Department for drug overdose.  Patient took 12 0.5mg  Xanax and 2 Gabapin.  Patient with suicidal ideations.  Per nurses notes: Patient put a 18 gauge cathlon to left hand in an attempt "to drain the blood out and bleed to death" States she lost ~ of blood.  Patient with history of depression, PTSD, HTN palpitations, left leg nerve damage.    PCP Estanislado Pandy, MD, MD [facility] [place]    Past Medical History  Diagnosis Date  . Fibromyalgia   . Depression   . PTSD (post-traumatic stress disorder)   . Chronic back pain   . Neuropathy     left foot  . Hypertension   . Migraine headache   . Tachycardia     Past Surgical History  Procedure Date  . Back surgery   . Breast surgery   . Cholecystectomy   . Abdominal hysterectomy   . Cesarean section   . Dilation and curettage of uterus     No family history on file.  History  Substance  Use Topics  . Smoking status: Never Smoker   . Smokeless tobacco: Not on file  . Alcohol Use: Yes     ocassional    OB History    Grav Para Term Preterm Abortions TAB SAB Ect Mult Living                  Review of Systems  Unable to perform ROS: Mental status change  Gastrointestinal: Negative for abdominal pain.  Psychiatric/Behavioral: Positive for altered mental status.    Allergies  Sulfa antibiotics; Bee; Morphine and related; and Latex  Home Medications   Current Outpatient Rx  Name Route Sig Dispense Refill  . ALPRAZOLAM 0.5 MG PO TABS Oral Take 0.5 mg by mouth 4 (four) times daily as needed. anxiety    . DULOXETINE HCL 60 MG PO CPEP Oral Take 60 mg by mouth 2 (two) times daily.      Marland Kitchen GABAPENTIN 600 MG PO TABS Oral Take 1,200 mg by mouth 2 (two) times daily between meals.      . GUAIFENESIN-CODEINE 100-10 MG/5ML PO SYRP  Take 10 mls q 4-6 hrs prn cough 240 mL 0  . PROMETHAZINE HCL 12.5 MG PO TABS  One po q 4-6 hrs prn nausea 12 tablet 0  . PROMETHAZINE HCL 25 MG PO TABS Oral Take 25 mg by mouth every 6 (six) hours as needed.  For nausea    . TRAMADOL HCL 50 MG PO TABS Oral Take 50 mg by mouth every 6 (six) hours as needed. pain    . VERAPAMIL HCL 80 MG PO TABS Oral Take 80 mg by mouth 2 (two) times daily.        BP 145/99  Pulse 102  Temp(Src) 98.3 F (36.8 C) (Oral)  Resp 23  Ht 5\' 4"  (1.626 m)  Wt 181 lb (82.101 kg)  BMI 31.07 kg/m2  SpO2 97%  Physical Exam  Constitutional: She is oriented to person, place, and time. She appears well-developed. She appears lethargic.  HENT:  Head: Normocephalic and atraumatic.  Eyes: Conjunctivae and EOM are normal. No scleral icterus.  Neck: Neck supple. No thyromegaly present.  Cardiovascular: Normal rate and regular rhythm.  Exam reveals no gallop and no friction rub.   No murmur heard. Pulmonary/Chest: No stridor. She has no wheezes. She has no rales. She exhibits no tenderness.  Abdominal: She exhibits no  distension. There is no tenderness. There is no rebound.  Musculoskeletal: Normal range of motion. She exhibits no edema.  Lymphadenopathy:    She has no cervical adenopathy.  Neurological: She is oriented to person, place, and time. She appears lethargic. Coordination normal.  Skin: No rash noted. No erythema.  Psychiatric: Her speech is normal. She expresses suicidal ideation.    ED Course  Procedures (including critical care time)   DIAGNOSTIC STUDIES: Oxygen Saturation is 96% on room air, normal by my interpretation.     COORDINATION OF CARE: 2:45 PM  IV fluids.   3:15 PM  Physical exam complete.   3:23 PM  Narcan ordered.   5:15 PM  Recheck.  Patient is still a little sleepy.  Plan to admit patient.   5:27 PM  Consult call returned.  5:29 PM  Spoke with Hospitalist regarding patient case.  Hospitalist will admit patient.             LABS / RADIOLOGY:   Labs Reviewed  BASIC METABOLIC PANEL - Abnormal; Notable for the following:    Glucose, Bld 100 (*)    All other components within normal limits  URINE RAPID DRUG SCREEN (HOSP PERFORMED) - Abnormal; Notable for the following:    Benzodiazepines POSITIVE (*)    Tetrahydrocannabinol POSITIVE (*)    Barbiturates POSITIVE (*)    All other components within normal limits  SALICYLATE LEVEL - Abnormal; Notable for the following:    Salicylate Lvl <2.0 (*)    All other components within normal limits  CBC  DIFFERENTIAL  URINALYSIS, ROUTINE W REFLEX MICROSCOPIC  ETHANOL  ACETAMINOPHEN LEVEL    Results for orders placed during the hospital encounter of 03/29/11  CBC      Component Value Range   WBC 8.1  4.0 - 10.5 (K/uL)   RBC 4.46  3.87 - 5.11 (MIL/uL)   Hemoglobin 13.0  12.0 - 15.0 (g/dL)   HCT 16.1  09.6 - 04.5 (%)   MCV 89.0  78.0 - 100.0 (fL)   MCH 29.1  26.0 - 34.0 (pg)   MCHC 32.7  30.0 - 36.0 (g/dL)   RDW 40.9  81.1 - 91.4 (%)   Platelets 338  150 - 400 (K/uL)  DIFFERENTIAL      Component Value Range     Neutrophils Relative 60  43 - 77 (%)   Neutro Abs 4.9  1.7 - 7.7 (K/uL)   Lymphocytes Relative 32  12 - 46 (%)   Lymphs  Abs 2.6  0.7 - 4.0 (K/uL)   Monocytes Relative 7  3 - 12 (%)   Monocytes Absolute 0.5  0.1 - 1.0 (K/uL)   Eosinophils Relative 1  0 - 5 (%)   Eosinophils Absolute 0.0  0.0 - 0.7 (K/uL)   Basophils Relative 0  0 - 1 (%)   Basophils Absolute 0.0  0.0 - 0.1 (K/uL)  BASIC METABOLIC PANEL      Component Value Range   Sodium 138  135 - 145 (mEq/L)   Potassium 3.9  3.5 - 5.1 (mEq/L)   Chloride 103  96 - 112 (mEq/L)   CO2 27  19 - 32 (mEq/L)   Glucose, Bld 100 (*) 70 - 99 (mg/dL)   BUN 17  6 - 23 (mg/dL)   Creatinine, Ser 8.41  0.50 - 1.10 (mg/dL)   Calcium 9.1  8.4 - 32.4 (mg/dL)   GFR calc non Af Amer >90  >90 (mL/min)   GFR calc Af Amer >90  >90 (mL/min)  URINALYSIS, ROUTINE W REFLEX MICROSCOPIC      Component Value Range   Color, Urine YELLOW  YELLOW    APPearance CLEAR  CLEAR    Specific Gravity, Urine 1.020  1.005 - 1.030    pH 7.0  5.0 - 8.0    Glucose, UA NEGATIVE  NEGATIVE (mg/dL)   Hgb urine dipstick NEGATIVE  NEGATIVE    Bilirubin Urine NEGATIVE  NEGATIVE    Ketones, ur NEGATIVE  NEGATIVE (mg/dL)   Protein, ur NEGATIVE  NEGATIVE (mg/dL)   Urobilinogen, UA 0.2  0.0 - 1.0 (mg/dL)   Nitrite NEGATIVE  NEGATIVE    Leukocytes, UA NEGATIVE  NEGATIVE   URINE RAPID DRUG SCREEN (HOSP PERFORMED)      Component Value Range   Opiates NONE DETECTED  NONE DETECTED    Cocaine NONE DETECTED  NONE DETECTED    Benzodiazepines POSITIVE (*) NONE DETECTED    Amphetamines NONE DETECTED  NONE DETECTED    Tetrahydrocannabinol POSITIVE (*) NONE DETECTED    Barbiturates POSITIVE (*) NONE DETECTED   ETHANOL      Component Value Range   Alcohol, Ethyl (B) <11  0 - 11 (mg/dL)  SALICYLATE LEVEL      Component Value Range   Salicylate Lvl <2.0 (*) 2.8 - 20.0 (mg/dL)  ACETAMINOPHEN LEVEL      Component Value Range   Acetaminophen (Tylenol), Serum <15.0  10 - 30 (ug/mL)     Dg Chest Port 1 View  03/29/2011  *RADIOLOGY REPORT*  Clinical Data: Weakness  PORTABLE CHEST - 1 VIEW  Comparison: 02/07/2011  Findings: Heart size and vascularity are normal.  Negative for pneumonia or mass.  Lungs are clear.  IMPRESSION: Negative  Original Report Authenticated By: Camelia Phenes, M.D.         MDM  Suicidal,  Xanax ingestion      MEDICATIONS GIVEN IN THE E.D. Scheduled Meds:    . naloxone      . naloxone  1 mg Intravenous Once   Continuous Infusions:    . sodium chloride 100 mL/hr at 03/29/11 1713       IMPRESSION: No diagnosis found.   NEW MEDICATIONS: New Prescriptions   No medications on file      The chart was scribed for me under my direct supervision.  I personally performed the history, physical, and medical decision making and all procedures in the evaluation of this patient.Tandy Gaw  Estell Harpin, MD 03/29/11 1746

## 2011-03-29 NOTE — Progress Notes (Addendum)
Called on-call MD about patient's Neurotin dosage and frequency.  Discussed that the patient had threatened to leave AMA due to her pain medications.  Patient education given and emotional support given.  Patient is calm with husband and sitter in the room at this time.

## 2011-03-29 NOTE — ED Notes (Signed)
Reported from NT sitting with pt that pt's sats decreased to 70's, pt asleep and snoring, EDP was notified and new orders ordered, RN Rice administered Narcan without any change

## 2011-03-29 NOTE — Progress Notes (Signed)
Called Act Team tt: Santina Evans @ [phone] - to inform the patient on the floor.

## 2011-03-29 NOTE — ED Notes (Signed)
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

## 2011-03-29 NOTE — H&P (Signed)
PCP:   Estanislado Pandy, MD, MD   Chief Complaint:  overdose  HPI: This is a 46 year old female with history of fibromyalgia, chronic pain, hypertension, depression. Patient is brought to the emergency room today after his suicide attempt. Apparently patient had taken a total of 12 pills of 0.5 mg Xanax. She is chronically been on Xanax for anxiety. When asked why she took these pills she clearly states that she no longer wanted to live. She is having some marital problems and is afraid that her husband will leave her. She also has significant stress from her teenage daughter. She has chronic pain and is having difficulty managing this and has become increasingly depressed. 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 She was last working in February of 2012, since then she's been of work. Patient also has a prior history of suicide attempts last one being in February of 2012. That time she also taken extra Xanax and mixed it with alcohol. In addition to taking Xanax today, patient had placed an IV in her arm and had laid down in the time in the hopes that she would bleed out. Patient is, lethargic today but he is easily arousable, can answer questions easily. She is very tearful. At this time she is complaining of her chronic pain issues. She has chronic right-sided shoulder pain, back pain, difficulty with gait due to left leg weakness. The patient had back surgery done approximately 2 years ago by a Careers adviser in [place]. She had the following with them, but after a recent MRI she was told that no further surgical intervention could be done. She has been on chronic pain medication and has just recently started to see Dr. Gerilyn Pilgrim.  Allergies:   Allergies  Allergen Reactions  . Sulfa Antibiotics Swelling    Tongue swelling  . Bee Swelling  . Morphine And Related Other (See Comments)    Causes headaches  . Latex Rash      Past Medical History  Diagnosis Date  . Fibromyalgia    . Depression   . PTSD (post-traumatic stress disorder)   . Chronic back pain   . Neuropathy     left foot  . Hypertension   . Migraine headache   . Tachycardia     Past Surgical History  Procedure Date  . Back surgery   . Breast surgery   . Cholecystectomy   . Abdominal hysterectomy   . Cesarean section   . Dilation and curettage of uterus     Prior to Admission medications   Medication Sig Start Date End Date Taking? Authorizing Provider  ALPRAZolam Prudy Feeler) 0.5 MG tablet Take 0.5 mg by mouth 4 (four) times daily as needed. anxiety    Historical Provider, MD  DULoxetine (CYMBALTA) 60 MG capsule Take 60 mg by mouth 2 (two) times daily.      Historical Provider, MD  gabapentin (NEURONTIN) 600 MG tablet Take 1,200 mg by mouth 2 (two) times daily between meals.      Historical Provider, MD  guaiFENesin-codeine [facility]) 100-10 MG/5ML syrup Take 10 mls q 4-6 hrs prn cough 02/07/11   Worthy Rancher, PA  promethazine (PHENERGAN) 12.5 MG tablet One po q 4-6 hrs prn nausea 02/07/11   Worthy Rancher, PA  promethazine (PHENERGAN) 25 MG tablet Take 25 mg by mouth every 6 (six) hours as needed. For nausea    Historical Provider, MD  traMADol (ULTRAM) 50 MG tablet Take 50 mg by  mouth every 6 (six) hours as needed. pain    Historical Provider, MD  verapamil (CALAN) 80 MG tablet Take 80 mg by mouth 2 (two) times daily.      Historical Provider, MD    Social History:  reports that she has never smoked. She does not have any smokeless tobacco history on file. She reports that she drinks alcohol. She reports that she does not use illicit drugs.  No family history on file.  Review of Systems: Positives in bold. Constitutional: Denies fever, chills, diaphoresis, appetite change and fatigue.  HEENT: Denies photophobia, eye pain, redness, hearing loss, ear pain, congestion, sore throat, rhinorrhea, sneezing, mouth sores, trouble swallowing, neck pain, neck stiffness and tinnitus.     Respiratory: Denies SOB, DOE, cough, chest tightness,  and wheezing.   Cardiovascular: Denies chest pain, palpitations and leg swelling.  Gastrointestinal: Denies nausea, vomiting, abdominal pain, diarrhea, constipation, blood in stool and abdominal distention.  Genitourinary: Denies dysuria, urgency, frequency, hematuria, flank pain and difficulty urinating.  Musculoskeletal: Denies myalgias, back pain, joint swelling, arthralgias and gait problem.  Skin: Denies pallor, rash and wound.  Neurological: Denies dizziness, seizures, syncope, weakness, light-headedness, numbness and headaches.  Hematological: Denies adenopathy. Easy bruising, personal or family bleeding history  Psychiatric/Behavioral: Denies suicidal ideation, mood changes, confusion, nervousness, sleep disturbance and agitation   Physical Exam: Blood pressure 145/99, pulse 102, temperature 98.3 F (36.8 C), temperature source Oral, resp. rate 23, height 5\' 4"  (1.626 m), weight 82.101 kg (181 lb), SpO2 97.00%. General: Patient is in no acute distress, she is lethargic but easily awakens to voice. On my arrival she is trying to dinner. She is very tearful. HEENT:: Normocephalic, atraumatic, pupils are equal round reactive to light Neck: Supple Chest: Clear to auscultation bilaterally Cardiac: S1, S2, tachycardic Abdomen: Soft, nontender, nondistended, bowel sounds are active Extremities: No cyanosis, clubbing, or edema Neurologic: Grossly intact, nonfocal Skin: Intact, no visible rashes  Labs on Admission:  Results for orders placed during the hospital encounter of 03/29/11 (from the past 48 hour(s))  CBC     Status: Normal   Collection Time   03/29/11  3:05 PM      Component Value Range Comment   WBC 8.1  4.0 - 10.5 (K/uL)    RBC 4.46  3.87 - 5.11 (MIL/uL)    Hemoglobin 13.0  12.0 - 15.0 (g/dL)    HCT 16.1  09.6 - 04.5 (%)    MCV 89.0  78.0 - 100.0 (fL)    MCH 29.1  26.0 - 34.0 (pg)    MCHC 32.7  30.0 - 36.0 (g/dL)     RDW 40.9  81.1 - 91.4 (%)    Platelets 338  150 - 400 (K/uL)   DIFFERENTIAL     Status: Normal   Collection Time   03/29/11  3:05 PM      Component Value Range Comment   Neutrophils Relative 60  43 - 77 (%)    Neutro Abs 4.9  1.7 - 7.7 (K/uL)    Lymphocytes Relative 32  12 - 46 (%)    Lymphs Abs 2.6  0.7 - 4.0 (K/uL)    Monocytes Relative 7  3 - 12 (%)    Monocytes Absolute 0.5  0.1 - 1.0 (K/uL)    Eosinophils Relative 1  0 - 5 (%)    Eosinophils Absolute 0.0  0.0 - 0.7 (K/uL)    Basophils Relative 0  0 - 1 (%)    Basophils Absolute 0.0  0.0 - 0.1 (K/uL)   BASIC METABOLIC PANEL     Status: Abnormal   Collection Time   03/29/11  3:05 PM      Component Value Range Comment   Sodium 138  135 - 145 (mEq/L)    Potassium 3.9  3.5 - 5.1 (mEq/L)    Chloride 103  96 - 112 (mEq/L)    CO2 27  19 - 32 (mEq/L)    Glucose, Bld 100 (*) 70 - 99 (mg/dL)    BUN 17  6 - 23 (mg/dL)    Creatinine, Ser 1.61  0.50 - 1.10 (mg/dL)    Calcium 9.1  8.4 - 10.5 (mg/dL)    GFR calc non Af Amer >90  >90 (mL/min)    GFR calc Af Amer >90  >90 (mL/min)   ETHANOL     Status: Normal   Collection Time   03/29/11  3:05 PM      Component Value Range Comment   Alcohol, Ethyl (B) <11  0 - 11 (mg/dL)   SALICYLATE LEVEL     Status: Abnormal   Collection Time   03/29/11  3:05 PM      Component Value Range Comment   Salicylate Lvl <2.0 (*) 2.8 - 20.0 (mg/dL)   ACETAMINOPHEN LEVEL     Status: Normal   Collection Time   03/29/11  3:05 PM      Component Value Range Comment   Acetaminophen (Tylenol), Serum <15.0  10 - 30 (ug/mL)   URINE RAPID DRUG SCREEN (HOSP PERFORMED)     Status: Abnormal   Collection Time   03/29/11  3:50 PM      Component Value Range Comment   Opiates NONE DETECTED  NONE DETECTED     Cocaine NONE DETECTED  NONE DETECTED     Benzodiazepines POSITIVE (*) NONE DETECTED     Amphetamines NONE DETECTED  NONE DETECTED     Tetrahydrocannabinol POSITIVE (*) NONE DETECTED     Barbiturates POSITIVE (*) NONE  DETECTED    URINALYSIS, ROUTINE W REFLEX MICROSCOPIC     Status: Normal   Collection Time   03/29/11  3:51 PM      Component Value Range Comment   Color, Urine YELLOW  YELLOW     APPearance CLEAR  CLEAR     Specific Gravity, Urine 1.020  1.005 - 1.030     pH 7.0  5.0 - 8.0     Glucose, UA NEGATIVE  NEGATIVE (mg/dL)    Hgb urine dipstick NEGATIVE  NEGATIVE     Bilirubin Urine NEGATIVE  NEGATIVE     Ketones, ur NEGATIVE  NEGATIVE (mg/dL)    Protein, ur NEGATIVE  NEGATIVE (mg/dL)    Urobilinogen, UA 0.2  0.0 - 1.0 (mg/dL)    Nitrite NEGATIVE  NEGATIVE     Leukocytes, UA NEGATIVE  NEGATIVE  MICROSCOPIC NOT DONE ON URINES WITH NEGATIVE PROTEIN, BLOOD, LEUKOCYTES, NITRITE, OR GLUCOSE <1000 mg/dL.    Radiological Exams on Admission: Dg Chest Port 1 View  03/29/2011  *RADIOLOGY REPORT*  Clinical Data: Weakness  PORTABLE CHEST - 1 VIEW  Comparison: 02/07/2011  Findings: Heart size and vascularity are normal.  Negative for pneumonia or mass.  Lungs are clear.  IMPRESSION: Negative  Original Report Authenticated By: Camelia Phenes, M.D.    Assessment/Plan Principal Problem:  *Overdose of benzodiazepine Active Problems:  HYPERTENSION, UNSPECIFIED  Suicide attempt  Fibromyalgia  Chronic pain  Depression  Plan:  Patient will need medical admission for stabilization.  She will be admitted to a telemetry bed. At this time she is adequately protecting her airway, her mental status is easily arousable. She is somewhat drowsy though. She will be continued on a one-to-one sitter, suicide precautions, and we will have the act team to evaluate her. We will hold her benzodiazepines, Cymbalta, until evaluated by psychiatry. Her urine tox screen is positive for benzos, barbiturates, and cannabis.  She reports that she only took the xanax. Will continue her antihypertensives.  We will continue her tramadol for chronic pain. She may need to be restarted on low-dose benzodiazepine to prevent any withdrawal.  This can be assessed tomorrow. Further orders per the clinical course  Time Spent on Admission:  , [facility] Pager: [phone] 03/29/2011, 6:20 PM

## 2011-03-29 NOTE — ED Notes (Signed)
Pt states she took 12 xanax 0.5mg  2 hours PTA. Pt also states she put a 18 gauge cathlon to left hand in an attempt "to drain the blood out and bleed to death" States she lost ~ of blood.

## 2011-03-30 ENCOUNTER — Inpatient Hospital Stay (HOSPITAL_COMMUNITY): Payer: Medicaid Other

## 2011-03-30 LAB — BASIC METABOLIC PANEL
CO2: 24 mEq/L (ref 19–32)
Calcium: 9 mg/dL (ref 8.4–10.5)
GFR calc Af Amer: >90 mL/min (ref >90)
Sodium: 138 mEq/L (ref 135–145)

## 2011-03-30 LAB — CBC
MCV: 89 fL (ref 78.0–100.0)
Platelets: 272 10*3/uL (ref 150–400)
RBC: 4.28 MIL/uL (ref 3.87–5.11)
WBC: 6.7 10*3/uL (ref 4.0–10.5)

## 2011-03-30 LAB — MRSA PCR SCREENING: MRSA by PCR: NEGATIVE

## 2011-03-30 MED ORDER — ALPRAZOLAM 0.25 MG PO TABS
0.2500 mg | ORAL_TABLET | Freq: Three times a day (TID) | ORAL | Status: DC | PRN
  Administered 2011-03-30 – 2011-04-01 (×3): 0.25 mg via ORAL
  Filled 2011-03-30 (×3): qty 1

## 2011-03-30 MED ORDER — HYDROCODONE-ACETAMINOPHEN 5-325 MG PO TABS
1.0000 | ORAL_TABLET | Freq: Three times a day (TID) | ORAL | Status: DC | PRN
  Administered 2011-03-30 – 2011-03-31 (×3): 2 via ORAL
  Administered 2011-04-01: 1 via ORAL
  Filled 2011-03-30 (×3): qty 2
  Filled 2011-03-30: qty 1

## 2011-03-30 MED ORDER — GABAPENTIN 400 MG PO CAPS
1200.0000 mg | ORAL_CAPSULE | Freq: Two times a day (BID) | ORAL | Status: DC
  Administered 2011-03-30 – 2011-03-31 (×2): 1200 mg via ORAL
  Filled 2011-03-30: qty 1
  Filled 2011-03-30: qty 2
  Filled 2011-03-30: qty 3

## 2011-03-30 NOTE — Progress Notes (Signed)
Tech reported to nurse that patient started complaining of some chest pressure and BP 150/100 manually.  MD notified.  Patient states she believes it is related to her anxiety, and per safety sitter, patient has been upset/crying while discussing family situations.  Patient is still NSR on tele, but will still be d/c'd per MD orders.  MD will place new orders for a low dose of xanax. Will continue to monitor patient.

## 2011-03-30 NOTE — Progress Notes (Signed)
Medically cleared by MD.  Delton See and IV d/c'd.  Patient is alert and oriented.  Tommy from ACT team called and updated on status.  Referred to on call ACT team member who has been paged.  If no one on call for weekend, will be seen on Monday.

## 2011-03-30 NOTE — Progress Notes (Signed)
Subjective: Still very tearful, complaining of pain in right shoulder after fall, xrays negative.   Objective: Vital signs in last 24 hours: Temp:  [97.5 F (36.4 C)-98.3 F (36.8 C)] 97.5 F (36.4 C) (03/03 0620) Pulse Rate:  [80-111] 80  (03/03 0620) Resp:  [20-24] 20  (03/03 0620) BP: (130-159)/(90-115) 137/91 mmHg (03/03 0620) SpO2:  [92 %-100 %] 97 % (03/02 2358) Weight:  [82.101 kg (181 lb)-86.3 kg (190 lb 4.1 oz)] 86.3 kg (190 lb 4.1 oz) (03/03 0500) Weight change:  Last BM Date: 03/29/11  Intake/Output from previous day: 03/02 0701 - 03/03 0700 In: 1945 [P.O.:720; I.V.:1225] Out: -  Total I/O In: 243 [P.O.:240; I.V.:3] Out: -    Physical Exam: General: Alert, awake, oriented x3, in no acute distress. HEENT: No bruits, no goiter. Heart: Regular rate and rhythm, without murmurs, rubs, gallops. Lungs: Clear to auscultation bilaterally. Abdomen: Soft, nontender, nondistended, positive bowel sounds. Extremities: No clubbing cyanosis or edema with positive pedal pulses.     Lab Results: Basic Metabolic Panel:  Basename 03/30/11 0554 03/29/11 1505  NA 138 138  K 3.4* 3.9  CL 104 103  CO2 24 27  GLUCOSE 97 100*  BUN 11 17  CREATININE 0.64 0.73  CALCIUM 9.0 9.1  MG -- --  PHOS -- --   Liver Function Tests: No results found for this basename: AST:2,ALT:2,ALKPHOS:2,BILITOT:2,PROT:2,ALBUMIN:2 in the last 72 hours No results found for this basename: LIPASE:2,AMYLASE:2 in the last 72 hours No results found for this basename: AMMONIA:2 in the last 72 hours CBC:  Basename 03/30/11 0554 03/29/11 1505  WBC 6.7 8.1  NEUTROABS -- 4.9  HGB 12.6 13.0  HCT 38.1 39.7  MCV 89.0 89.0  PLT 272 338   Cardiac Enzymes: No results found for this basename: CKTOTAL:3,CKMB:3,CKMBINDEX:3,TROPONINI:3 in the last 72 hours BNP: No results found for this basename: PROBNP:3 in the last 72 hours D-Dimer: No results found for this basename: DDIMER:2 in the last 72 hours CBG: No  results found for this basename: GLUCAP:6 in the last 72 hours Hemoglobin A1C: No results found for this basename: HGBA1C in the last 72 hours Fasting Lipid Panel: No results found for this basename: CHOL,HDL,LDLCALC,TRIG,CHOLHDL,LDLDIRECT in the last 72 hours Thyroid Function Tests: No results found for this basename: TSH,T4TOTAL,FREET4,T3FREE,THYROIDAB in the last 72 hours Anemia Panel: No results found for this basename: VITAMINB12,FOLATE,FERRITIN,TIBC,IRON,RETICCTPCT in the last 72 hours Coagulation: No results found for this basename: LABPROT:2,INR:2 in the last 72 hours Urine Drug Screen: Drugs of Abuse     Component Value Date/Time   LABOPIA NONE DETECTED 03/29/2011 1550   COCAINSCRNUR NONE DETECTED 03/29/2011 1550   LABBENZ POSITIVE* 03/29/2011 1550   AMPHETMU NONE DETECTED 03/29/2011 1550   THCU POSITIVE* 03/29/2011 1550   LABBARB POSITIVE* 03/29/2011 1550    Alcohol Level:  Basename 03/29/11 1505  ETH <11   Urinalysis:  Basename 03/29/11 1551  COLORURINE YELLOW  LABSPEC 1.020  PHURINE 7.0  GLUCOSEU NEGATIVE  HGBUR NEGATIVE  BILIRUBINUR NEGATIVE  KETONESUR NEGATIVE  PROTEINUR NEGATIVE  UROBILINOGEN 0.2  NITRITE NEGATIVE  LEUKOCYTESUR NEGATIVE    Recent Results (from the past 240 hour(s))  MRSA PCR SCREENING     Status: Normal   Collection Time   03/29/11  8:49 PM      Component Value Range Status Comment   MRSA by PCR NEGATIVE  NEGATIVE  Final     Studies/Results: Dg Shoulder Right  03/30/2011  *RADIOLOGY REPORT*  Clinical Data: Pain post fall  RIGHT SHOULDER -  2+ VIEW  Comparison: 11/18/2010  Findings:Negative for fracture, dislocation, or other acute abnormality.  Normal alignment and mineralization. No significant degenerative change.  Regional soft tissues unremarkable.  IMPRESSION:  Negative  Original Report Authenticated By: Osa Craver, M.D.   Dg Elbow Complete Left  03/30/2011  *RADIOLOGY REPORT*  Clinical Data: Pain post fall.  LEFT ELBOW - COMPLETE  3+ VIEW  Comparison: None.  Findings: No effusion. Negative for fracture, dislocation, or other acute abnormality.  Normal alignment and mineralization. No significant degenerative change.  Regional soft tissues unremarkable.  IMPRESSION:  Negative  Original Report Authenticated By: Osa Craver, M.D.   Dg Chest Port 1 View  03/29/2011  *RADIOLOGY REPORT*  Clinical Data: Weakness  PORTABLE CHEST - 1 VIEW  Comparison: 02/07/2011  Findings: Heart size and vascularity are normal.  Negative for pneumonia or mass.  Lungs are clear.  IMPRESSION: Negative  Original Report Authenticated By: Camelia Phenes, M.D.    Medications: Scheduled Meds:   . enoxaparin  40 mg Subcutaneous Q24H  . gabapentin  1,600 mg Oral BID BM  . naloxone      . sodium chloride  3 mL Intravenous Q12H  . sodium chloride      . verapamil  80 mg Oral BID  . DISCONTD: gabapentin  800 mg Oral BID BM  . DISCONTD: gabapentin  600 mg Oral BID BM  . DISCONTD: gabapentin  800 mg Oral BID BM  . DISCONTD: naloxone  1 mg Intravenous Once   Continuous Infusions:   . sodium chloride 100 mL/hr at 03/30/11 0441   PRN Meds:.acetaminophen, acetaminophen, ondansetron (ZOFRAN) IV, ondansetron, traMADol  Assessment/Plan:  Principal Problem:  *Overdose of benzodiazepine Active Problems:  HYPERTENSION, UNSPECIFIED  Suicide attempt  Fibromyalgia  Chronic pain  Depression  Plan:  Continue 1:1 sitter Await  ACT team evaluation Restart outpatient vicodin Restart outpatient neurontin  Dispo.  Possible transfer to inpatient psych if psychiatry agrees.   LOS: 1 day   , [facility] Pager: [phone] 03/30/2011, 10:35 AM
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
## 2011-03-31 ENCOUNTER — Inpatient Hospital Stay (HOSPITAL_COMMUNITY): Payer: Medicaid Other

## 2011-03-31 MED ORDER — HYDROMORPHONE HCL PF 1 MG/ML IJ SOLN
0.5000 mg | Freq: Once | INTRAMUSCULAR | Status: AC
  Administered 2011-03-31: 0.5 mg via INTRAVENOUS
  Filled 2011-03-31: qty 1

## 2011-03-31 MED ORDER — SODIUM CHLORIDE 0.9 % IJ SOLN: 3.0000 mL | INTRAMUSCULAR | Status: DC | PRN

## 2011-03-31 MED ORDER — SODIUM CHLORIDE 0.9 % IJ SOLN
INTRAMUSCULAR | Status: AC
  Administered 2011-03-31: 10 mL
  Filled 2011-03-31: qty 3

## 2011-03-31 MED ORDER — PROMETHAZINE HCL 25 MG/ML IJ SOLN
12.5000 mg | Freq: Four times a day (QID) | INTRAMUSCULAR | Status: DC | PRN
  Administered 2011-03-31: 12.5 mg via INTRAVENOUS
  Filled 2011-03-31: qty 1

## 2011-03-31 MED ORDER — GABAPENTIN 400 MG PO CAPS
1600.0000 mg | ORAL_CAPSULE | Freq: Two times a day (BID) | ORAL | Status: DC
  Administered 2011-03-31 – 2011-04-01 (×2): 1600 mg via ORAL
  Filled 2011-03-31 (×2): qty 4

## 2011-03-31 MED ORDER — DIAZEPAM 5 MG PO TABS
10.0000 mg | ORAL_TABLET | Freq: Once | ORAL | Status: AC
  Administered 2011-03-31: 10 mg via ORAL

## 2011-03-31 MED ORDER — GABAPENTIN 800 MG PO TABS: 1600.0000 mg | ORAL_TABLET | Freq: Two times a day (BID) | ORAL | Status: DC

## 2011-03-31 MED ORDER — DIAZEPAM 5 MG PO TABS
10.0000 mg | ORAL_TABLET | Freq: Once | ORAL | Status: DC
  Filled 2011-03-31 (×2): qty 2

## 2011-03-31 MED ORDER — SODIUM CHLORIDE 0.9 % IJ SOLN
3.0000 mL | Freq: Two times a day (BID) | INTRAMUSCULAR | Status: DC
  Administered 2011-03-31 – 2011-04-01 (×2): 3 mL via INTRAVENOUS
  Filled 2011-03-31 (×2): qty 3

## 2011-03-31 MED ORDER — GADOBENATE DIMEGLUMINE 529 MG/ML IV SOLN
18.0000 mL | Freq: Once | INTRAVENOUS | Status: AC | PRN
  Administered 2011-03-31: 18 mL via INTRAVENOUS

## 2011-03-31 MED ORDER — BUTALBITAL-APAP-CAFFEINE 50-325-40 MG PO TABS
1.0000 | ORAL_TABLET | Freq: Four times a day (QID) | ORAL | Status: DC | PRN
  Administered 2011-03-31: 1 via ORAL
  Filled 2011-03-31: qty 1

## 2011-03-31 MED ORDER — SODIUM CHLORIDE 0.9 % IV SOLN
250.0000 mL | INTRAVENOUS | Status: DC | PRN
  Administered 2011-03-31: 250 mL via INTRAVENOUS

## 2011-03-31 MED ORDER — GABAPENTIN 400 MG PO CAPS
400.0000 mg | ORAL_CAPSULE | Freq: Once | ORAL | Status: AC
  Administered 2011-03-31: 400 mg via ORAL
  Filled 2011-03-31: qty 1

## 2011-03-31 NOTE — Progress Notes (Signed)
Subjective: Headache is feeling better, chest pain resolved with xanax, anxiety is better now. No new complaints  Objective: Vital signs in last 24 hours: Temp:  [97.6 F (36.4 C)-97.8 F (36.6 C)] 97.6 F (36.4 C) (03/04 0503) Pulse Rate:  [78-85] 85  (03/04 0503) Resp:  [20] 20  (03/04 0503) BP: (129-151)/(85-95) 151/95 mmHg (03/04 1224) SpO2:  [95 %-96 %] 95 % (03/04 0503) Weight change:  Last BM Date: 03/29/11  Intake/Output from previous day: 03/03 0701 - 03/04 0700 In: 1483 [P.O.:1080; I.V.:403] Out: -  Total I/O In: 20 [I.V.:20] Out: -    Physical Exam: General: Alert, awake, oriented x3, in no acute distress. HEENT: No bruits, no goiter. Heart: Regular rate and rhythm, without murmurs, rubs, gallops. Lungs: Clear to auscultation bilaterally. Abdomen: Soft, nontender, nondistended, positive bowel sounds. Extremities: No clubbing cyanosis or edema with positive pedal pulses.     Lab Results: Basic Metabolic Panel:  Basename 03/30/11 0554 03/29/11 1505  NA 138 138  K 3.4* 3.9  CL 104 103  CO2 24 27  GLUCOSE 97 100*  BUN 11 17  CREATININE 0.64 0.73  CALCIUM 9.0 9.1  MG -- --  PHOS -- --   Liver Function Tests: No results found for this basename: AST:2,ALT:2,ALKPHOS:2,BILITOT:2,PROT:2,ALBUMIN:2 in the last 72 hours No results found for this basename: LIPASE:2,AMYLASE:2 in the last 72 hours No results found for this basename: AMMONIA:2 in the last 72 hours CBC:  Basename 03/30/11 0554 03/29/11 1505  WBC 6.7 8.1  NEUTROABS -- 4.9  HGB 12.6 13.0  HCT 38.1 39.7  MCV 89.0 89.0  PLT 272 338   Cardiac Enzymes: No results found for this basename: CKTOTAL:3,CKMB:3,CKMBINDEX:3,TROPONINI:3 in the last 72 hours BNP: No results found for this basename: PROBNP:3 in the last 72 hours D-Dimer: No results found for this basename: DDIMER:2 in the last 72 hours CBG: No results found for this basename: GLUCAP:6 in the last 72 hours Hemoglobin A1C: No results  found for this basename: HGBA1C in the last 72 hours Fasting Lipid Panel: No results found for this basename: CHOL,HDL,LDLCALC,TRIG,CHOLHDL,LDLDIRECT in the last 72 hours Thyroid Function Tests: No results found for this basename: TSH,T4TOTAL,FREET4,T3FREE,THYROIDAB in the last 72 hours Anemia Panel: No results found for this basename: VITAMINB12,FOLATE,FERRITIN,TIBC,IRON,RETICCTPCT in the last 72 hours Coagulation: No results found for this basename: LABPROT:2,INR:2 in the last 72 hours Urine Drug Screen: Drugs of Abuse     Component Value Date/Time   LABOPIA NONE DETECTED 03/29/2011 1550   COCAINSCRNUR NONE DETECTED 03/29/2011 1550   LABBENZ POSITIVE* 03/29/2011 1550   AMPHETMU NONE DETECTED 03/29/2011 1550   THCU POSITIVE* 03/29/2011 1550   LABBARB POSITIVE* 03/29/2011 1550    Alcohol Level:  Basename 03/29/11 1505  ETH <11   Urinalysis:  Basename 03/29/11 1551  COLORURINE YELLOW  LABSPEC 1.020  PHURINE 7.0  GLUCOSEU NEGATIVE  HGBUR NEGATIVE  BILIRUBINUR NEGATIVE  KETONESUR NEGATIVE  PROTEINUR NEGATIVE  UROBILINOGEN 0.2  NITRITE NEGATIVE  LEUKOCYTESUR NEGATIVE   Recent Results (from the past 240 hour(s))  MRSA PCR SCREENING     Status: Normal   Collection Time   03/29/11  8:49 PM      Component Value Range Status Comment   MRSA by PCR NEGATIVE  NEGATIVE  Final     Studies/Results: Dg Shoulder Right  03/30/2011  *RADIOLOGY REPORT*  Clinical Data: Pain post fall  RIGHT SHOULDER - 2+ VIEW  Comparison: 11/18/2010  Findings:Negative for fracture, dislocation, or other acute abnormality.  Normal alignment and mineralization.  No significant degenerative change.  Regional soft tissues unremarkable.  IMPRESSION:  Negative  Original Report Authenticated By: Osa Craver, M.D.   Dg Elbow Complete Left  03/30/2011  *RADIOLOGY REPORT*  Clinical Data: Pain post fall.  LEFT ELBOW - COMPLETE 3+ VIEW  Comparison: None.  Findings: No effusion. Negative for fracture, dislocation, or  other acute abnormality.  Normal alignment and mineralization. No significant degenerative change.  Regional soft tissues unremarkable.  IMPRESSION:  Negative  Original Report Authenticated By: Osa Craver, M.D.   Ct Head Wo Contrast  03/30/2011  *RADIOLOGY REPORT*  Clinical Data: Persistent left side headache, syncope, history hypertension, fibromyalgia, migraines  CT HEAD WITHOUT CONTRAST  Technique:  Contiguous axial images were obtained from the base of the skull through the vertex without contrast.  Comparison: 02/07/2011  Findings: Normal ventricular morphology. No midline shift or mass effect. Area of poorly defined low attenuation approximately 13 mm greatest size identified within medial aspect of left hemisphere with adjacent to midline, uncertain etiology. This finding is new since previous studies. No intracranial hemorrhage, additional mass lesion or additional evidence of acute infarction. No extra-axial fluid collections. Visualized bones and sinuses normal appearance.  IMPRESSION: Poorly defined area of low attenuation at medial aspect of left hemisphere adjacent to falx, 1.3 cm greatest size, uncertain etiology. This was not seen on previous studies. Subtle mass and infarct not excluded. Recommend follow-up MR imaging of the brain with and without contrast to further evaluate.  Original Report Authenticated By: Lollie Marrow, M.D.   Mr Laqueta Jean Wo Contrast  03/31/2011  *RADIOLOGY REPORT*  Clinical Data: 46 year old female with confusion.  Abnormal head CT.  MRI HEAD WITHOUT AND WITH CONTRAST  Technique:  Multiplanar, multiecho pulse sequences of the brain and surrounding structures were obtained according to standard protocol without and with intravenous contrast  Contrast: 18mL MULTIHANCE GADOBENATE DIMEGLUMINE 529 MG/ML IV SOLN  Comparison: Head CTs without contrast 03/30/2011 and earlier.  Findings: No restricted diffusion to suggest acute infarction.  No midline shift, mass effect,  evidence of mass lesion, ventriculomegaly, extra-axial collection or acute intracranial hemorrhage.  Cervicomedullary junction and pituitary are within normal limits.  Major intracranial vascular flow voids are preserved.  Wallace Cullens and white matter signal is within normal limits throughout the brain.  No signal abnormality to correspond to the left superior frontal/cingulate gyrus CT abnormality.  No abnormal enhancement identified.   Normal cerebral volume.  Negative visualized cervical spine.  Visualized bone marrow signal is within normal limits.  Visualized orbit soft tissues are within normal limits.  Visualized paranasal sinuses and mastoids are clear.  Negative scalp soft tissues.  IMPRESSION: Normal noncontrast MRI appearance of the brain. No abnormality to correspond to the earlier CT findings.  Original Report Authenticated By: Harley Hallmark, M.D.    Medications: Scheduled Meds:   . diazepam  10 mg Oral Once  . diazepam  10 mg Oral Once  . enoxaparin  40 mg Subcutaneous Q24H  . gabapentin  1,600 mg Oral BID  . gabapentin  400 mg Oral Once  .  HYDROmorphone (DILAUDID) injection  0.5 mg Intravenous Once  . sodium chloride  3 mL Intravenous Q12H  . sodium chloride      . sodium chloride      . verapamil  80 mg Oral BID  . DISCONTD: gabapentin  1,200 mg Oral BID BM  . DISCONTD: gabapentin  1,600 mg Oral BID  . DISCONTD: sodium chloride  3 mL Intravenous  Q12H   Continuous Infusions:  PRN Meds:.sodium chloride, acetaminophen, acetaminophen, ALPRAZolam, butalbital-acetaminophen-caffeine, gadobenate dimeglumine, HYDROcodone-acetaminophen, ondansetron (ZOFRAN) IV, ondansetron, promethazine, sodium chloride, traMADol  Assessment/Plan:  Principal Problem:  *Overdose of benzodiazepine Active Problems:  HYPERTENSION, UNSPECIFIED  Suicide attempt  Fibromyalgia  Chronic pain  Depression  Plan: Abnormality found on CT head was not found on MRI brain.  MRI was negative. Cont 1:1 sitter D/c  telemetry as chest pain was likely related to anxiety, improved with xanax Await ACT team evaluation.  Medically stable for transfer   LOS: 2 days   , [facility] Pager: [phone] 03/31/2011, 5:47 PM
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
## 2011-04-01 ENCOUNTER — Inpatient Hospital Stay (HOSPITAL_COMMUNITY): Admission: AD | Admit: 2011-04-01 | Payer: Self-pay | Source: Ambulatory Visit | Admitting: Psychiatry

## 2011-04-01 MED ORDER — ALPRAZOLAM 0.25 MG PO TABS: 0.2500 mg | ORAL_TABLET | Freq: Three times a day (TID) | ORAL | Status: AC | PRN

## 2011-04-01 MED ORDER — HYDROCODONE-ACETAMINOPHEN 5-325 MG PO TABS: 1.0000 | ORAL_TABLET | Freq: Four times a day (QID) | ORAL | Status: DC | PRN

## 2011-04-01 MED ORDER — INFLUENZA VIRUS VACC SPLIT PF IM SUSP
0.5000 mL | Freq: Once | INTRAMUSCULAR | Status: AC
  Administered 2011-04-01: 0.5 mL via INTRAMUSCULAR
  Filled 2011-04-01: qty 0.5

## 2011-04-01 NOTE — Progress Notes (Signed)
Ashley [name] remains suicidal this morning. She will not contract for safety. She has been accepted by Dr. Robet Leu to [facility].  She is a Electrical engineer. Spoke with Crista Elliot, who authorized 3 days starting today, the [name]. # is [id number]. She will be transported by [employer]. The hospitalitis is in agreement with the disposition. Patient became upset when told that she was being [name] because of her suicide attempt. Patient is now refusing to cooperate, and therefore IVC forms were completed. Dr Kerry Hough is in agreement with the disposition.

## 2011-04-01 NOTE — Discharge Summary (Signed)
Physician Discharge Summary  Patient ID: Ashley [name] MRN: [id number] DOB/AGE: 03-16-1965 46 y.o.  Admit date: 03/29/2011 Discharge date: 04/01/2011  Primary Care Physician:  Estanislado Pandy, MD, MD   Discharge Diagnoses:    Principal Problem:  *Overdose of benzodiazepine Active Problems:  HYPERTENSION, UNSPECIFIED  Suicide attempt  Fibromyalgia  Chronic pain  Depression    Medication List  As of 04/01/2011 11:17 AM   STOP taking these medications         DULoxetine 60 MG capsule         TAKE these medications         ALPRAZolam 0.25 MG tablet   Commonly known as: XANAX   Take 1 tablet (0.25 mg total) by mouth 3 (three) times daily as needed for anxiety.      butalbital-acetaminophen-caffeine 50-325-40 MG per tablet   Commonly known as: FIORICET, ESGIC   Take 1 tablet by mouth 2 (two) times daily as needed. For headaches      fish oil-omega-3 fatty acids 1000 MG capsule   Take 2 g by mouth daily.      gabapentin 800 MG tablet   Commonly known as: NEURONTIN   Take 1,600 mg by mouth 2 (two) times daily.      HYDROcodone-acetaminophen 5-325 MG per tablet   Commonly known as: NORCO   Take 1-2 tablets by mouth every 6 (six) hours as needed. For pain      promethazine 25 MG tablet   Commonly known as: PHENERGAN   Take 25 mg by mouth every 6 (six) hours as needed. For nausea      traMADol 50 MG tablet   Commonly known as: ULTRAM   Take 50 mg by mouth every 6 (six) hours as needed. pain      verapamil 80 MG tablet   Commonly known as: CALAN   Take 80 mg by mouth 2 (two) times daily.      VITAMIN D PO   Take 2 tablets by mouth daily.           Discharge exam: She is very upset about plans for transfer to inpatient psychiatric facility Blood pressure 144/88, pulse 99, temperature 98.3 F (36.8 C), temperature source Oral, resp. rate 20, height 5\' 4"  (1.626 m), weight 86.3 kg (190 lb 4.1 oz), SpO2 94.00%. NAD CTA B S1, S2, RRR Soft, NT, BS+ No edema  b/l  Disposition and Follow-up:  Patient will be discharged to [facility] for inpatient psychiatric evaluation/treatment  Consults:  ACT team   Significant Diagnostic Studies:  Dg Shoulder Right  03/30/2011  *RADIOLOGY REPORT*  Clinical Data: Pain post fall  RIGHT SHOULDER - 2+ VIEW  Comparison: 11/18/2010  Findings:Negative for fracture, dislocation, or other acute abnormality.  Normal alignment and mineralization. No significant degenerative change.  Regional soft tissues unremarkable.  IMPRESSION:  Negative  Original Report Authenticated By: Osa Craver, M.D.   Dg Elbow Complete Left  03/30/2011  *RADIOLOGY REPORT*  Clinical Data: Pain post fall.  LEFT ELBOW - COMPLETE 3+ VIEW  Comparison: None.  Findings: No effusion. Negative for fracture, dislocation, or other acute abnormality.  Normal alignment and mineralization. No significant degenerative change.  Regional soft tissues unremarkable.  IMPRESSION:  Negative  Original Report Authenticated By: Osa Craver, M.D.   Dg Chest Port 1 View  03/29/2011  *RADIOLOGY REPORT*  Clinical Data: Weakness  PORTABLE CHEST - 1 VIEW  Comparison: 02/07/2011  Findings: Heart size and vascularity are normal.  Negative for pneumonia or mass.  Lungs are clear.  IMPRESSION: Negative  Original Report Authenticated By: Camelia Phenes, M.D.    Brief H and P: For complete details please refer to admission H and P, but in brief This is a 46 year old female with history of fibromyalgia, chronic pain, hypertension, depression. Patient is brought to the emergency room today after his suicide attempt. Apparently patient had taken a total of 12 pills of 0.5 mg Xanax. She is chronically been on Xanax for anxiety. When asked why she took these pills she clearly states that she no longer wanted to live. She is having some marital problems and is afraid that her husband will leave her. She also has significant stress from her teenage daughter. She has chronic  pain and is having difficulty managing this and has become increasingly depressed. 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 She was last working in February of 2012, since then she's been of work. Patient also has a prior history of suicide attempts last one being in February of 2012. That time she also taken extra Xanax and mixed it with alcohol. In addition to taking Xanax today, patient had placed an IV in her arm and had laid down in the time in the hopes that she would bleed out.  Patient is, lethargic today but he is easily arousable, can answer questions easily. She is very tearful. At this time she is complaining of her chronic pain issues. She has chronic right-sided shoulder pain, back pain, difficulty with gait due to left leg weakness. The patient had back surgery done approximately 2 years ago by a Careers adviser in [place]. She had the following with them, but after a recent MRI she was told that no further surgical intervention could be done. She has been on chronic pain medication and has just recently started to see Dr. Gerilyn Pilgrim.     Hospital Course:  This 46 year old female was admitted to the hospital after ingesting extra pills of Xanax and trying to bleed out from an ID that she had placed in her arm with the intent to commit suicide. Patient has depression and sees a psychiatrist. She had taken a total of 6 mg Xanax. On arrival to the emergency room she was lethargic but was able to answer questions. She repeatedly says she no longer wants to be here. Patient also has chronic pain issues. This has also been contributing to her depression. She's also been having marital issues. She was monitored in the hospital, did not have any significant respiratory depression. Her mental status has improved back to baseline. We have restarted low-dose Xanax when necessary to avoid any signs of withdrawal. We have stopped her Cymbalta which she has been taking for depression and  fibromyalgia. This will need to be reassessed by the psychiatric team to determine if another agent would be more beneficial. Patient has been evaluated by the ACT team and felt that patient would require inpatient transfer to a psychiatric facility. She has been accepted [facility] for further inpatient psychiatric evaluation/treatment.  Patient does have difficulty with her memory on the day that she had ingested extra Xanax. She also reports falling and hitting her head. CT of the head was checked which showed a poorly defined area of low attenuation at the medial aspect of the left hemisphere adjacent to the falx of uncertain etiology. This was further evaluated by an MRI of the brain which was found to be normal. There  were no abnormalities found on the MRI to correspond to the earlier CT findings. Patient is medically cleared for discharge, but she will need further inpatient psychiatric care. We'll plan on transferring her later today  Time spent on Discharge:  Signed: MEMON,JEHANZEB [facility] Pager: [phone] 04/01/2011, 11:17 AM

## 2011-04-01 NOTE — Discharge Instructions (Signed)
Overdose, Adult    A person can overdose on alcohol, drugs or both by accident or on purpose. If it was on purpose, it is a serious matter. Professional help should be sought. If the overdose was an accident, certain steps should be taken to make sure that it never happens again.  ACCIDENTAL OVERDOSE  Overdosing on prescription medications can be a result of:  · Not understanding the instructions.  · Misreading the label.  · Forgetting that you took a dose and then taking another by mistake. This situation happens a lot.  To make sure this does not happen again:  · Clarify the correct dosage with your caregiver.  · Place the correct dosage in a "pill-minder" container (labeled for each day and time of day).  · Have someone dispense your medicine.  Please be sure to follow-up with your primary care doctor as directed.  INTENTIONAL OVERDOSE  If the overdose was on purpose, it is a serious situation. Taking more than the prescribed amount of medications (including taking someone else's prescription), abusing street drugs or drinking an amount of alcohol that requires medical treatment can show a variety of possible problems. These may indicate you:  · Are depressed or suicidal.  · Are abusing drugs, took too much or combined different drugs to experiment with the effects.  · Mixed alcohol with drugs and did not realize the danger of doing so (this is drug abuse).  · Are suffering addiction to drugs and/or alcohol (also known as chemical dependency).  · Binge drink.  If you have not been referred to a mental health professional for help, it is important that you get help right away. Only a professional can determine which problems may exist and what the best course of treatment may be. It is your responsibility to follow-up with further evaluation or treatment as directed.   Alcohol is responsible for a large number of overdoses and unintended deaths among college-age young adults. Binge drinking is consuming 4-5 drinks  in a short period of time. The amount of alcohol in standard servings of wine (5 oz.), beer (12 oz.) and distilled spirits (1.5 oz., 80 proof) is the same. Beer or wine can be just as dangerous to the binge drinker as "hard" liquor can be.   CONSEQUENCES OF BINGE DRINKING  Alcohol poisoning is the most serious consequence of binge drinking. This is a severe and potentially fatal physical reaction to an alcohol overdose. When too much alcohol is consumed, the brain does not get enough oxygen. The lack of oxygen will eventually cause the brain to shut down the voluntary functions that regulate breathing and heart rate. Symptoms of alcohol poisoning include:  · Vomiting.  · Passing out (unconsciousness).  · Cold, clammy, pale or bluish skin.  · Slow or irregular breathing.  WHAT SHOULD I DO NEXT?  If you have a history of drug abuse or suffer chemical dependency (alcoholism, drug addiction or both), you might consider the following:  · Talk with a qualified substance abuse counselor and consider entering a treatment program.  · Go to a detox facility if necessary.  · If you were attending self-help group meetings, consider returning to them and go often.  · Explore other resources located near you (see sources listed below).  If you are unsure if you have a substance abuse problem, ask yourself the following questions:  · Have you been told by friends or family that drugs/alcohol has become a problem?  · Do you get   into fights when drinking or using drugs?  · Do you have blackouts (not remembering what you do while using)?  · Do you lie about use or amounts of drugs or alcohol you consume?  · Do you need chemicals to get you going?  · Do you suffer in work or school performance because of drug or alcohol use?  · Do you get sick from drug or alcohol use but continue to use anyway?  · Do you need drugs or alcohol to relate to people or feel comfortable in social situations?  · Do you use drugs or alcohol to forget  problems?  If you answered "Yes" to any of the above questions, it means you show signs of chemical dependency and a professional evaluation is suggested. The longer the use of drugs and alcohol continues, the problems will become greater.  SEEK IMMEDIATE MEDICAL CARE IF:   · You feel like you might repeat your problematic behavior.  · You need someone to talk to and feel that it should not wait.  · You feel you are a danger to yourself or someone else.  · You feel like you are having a new reaction to medications you are taking, or you are getting worse after leaving a care center.  · You have an overwhelming urge to drink or use drugs.  Addiction cannot be cured, but it can be treated successfully. Treatment centers are listed in the yellow pages under: Cocaine, Narcotics, and Alcoholics Anonymous. Most hospitals and clinics can refer you to a specialized care center. The [place] government maintains a toll-free number for obtaining treatment referrals: 1-800-662-4357 or 1-800-487-4889 (TDD) and maintains a website: http://findtreatment.samhsa.gov. Other websites for additional information are: www.mentalhealth.samhsa.gov. and www.nida.gov.  In [place] treatment resources are listed in each Province with listings available under [facility] or similar titles.  Document Released: 01/16/2003 Document Revised: 01/02/2011 Document Reviewed: 12/08/2007  ExitCare® Patient Information ©2012 [employer].

## 2011-04-01 NOTE — Progress Notes (Signed)
Discharge Summary: a/o. Saline lock removed. Vss. Up ad lib. No complaints.  Spouse at bedside. Pt to be transported via [employer] police dept to [facility].

## 2011-04-01 NOTE — Progress Notes (Signed)
Report called to [employer] RN at [facility]. Caregiver verbalized understanding of report. Pt left floor with [place] police dept.

## 2011-04-09 ENCOUNTER — Emergency Department (HOSPITAL_COMMUNITY)
Admission: EM | Admit: 2011-04-09 | Discharge: 2011-04-10 | Disposition: A | Discharge status: Home / Self Care | Payer: Medicaid Other | Attending: Emergency Medicine | Admitting: Emergency Medicine

## 2011-04-09 ENCOUNTER — Encounter (HOSPITAL_COMMUNITY): Payer: Self-pay

## 2011-04-09 DIAGNOSIS — R51 Headache: Secondary | ICD-10-CM | POA: Insufficient documentation

## 2011-04-09 DIAGNOSIS — F4310 Post-traumatic stress disorder, unspecified: Secondary | ICD-10-CM | POA: Insufficient documentation

## 2011-04-09 DIAGNOSIS — IMO0001 Reserved for inherently not codable concepts without codable children: Secondary | ICD-10-CM | POA: Insufficient documentation

## 2011-04-09 DIAGNOSIS — M542 Cervicalgia: Secondary | ICD-10-CM | POA: Insufficient documentation

## 2011-04-09 DIAGNOSIS — I10 Essential (primary) hypertension: Secondary | ICD-10-CM | POA: Insufficient documentation

## 2011-04-09 DIAGNOSIS — W19XXXA Unspecified fall, initial encounter: Secondary | ICD-10-CM | POA: Insufficient documentation

## 2011-04-09 NOTE — ED Notes (Addendum)
Pt a/o x3. Present with headache that began Monday. Pt reports pain being a "base of my skull" c/o constant throbbing pain that is nauseating. Movement increases pain. States, " this is the worse pain I have ever had. Pt tearful.

## 2011-04-10 ENCOUNTER — Other Ambulatory Visit: Payer: Self-pay

## 2011-04-10 MED ORDER — KETOROLAC TROMETHAMINE 60 MG/2ML IM SOLN
60.0000 mg | Freq: Once | INTRAMUSCULAR | Status: AC
  Administered 2011-04-10: 60 mg via INTRAMUSCULAR
  Filled 2011-04-10: qty 2

## 2011-04-10 MED ORDER — HYDROMORPHONE HCL PF 2 MG/ML IJ SOLN
2.0000 mg | Freq: Once | INTRAMUSCULAR | Status: AC
  Administered 2011-04-10: 2 mg via INTRAMUSCULAR
  Filled 2011-04-10: qty 1

## 2011-04-10 MED ORDER — METHOCARBAMOL 500 MG PO TABS
1000.0000 mg | ORAL_TABLET | Freq: Once | ORAL | Status: AC
  Administered 2011-04-10: 1000 mg via ORAL
  Filled 2011-04-10: qty 2

## 2011-04-10 MED ORDER — METHOCARBAMOL 500 MG PO TABS: 500.0000 mg | ORAL_TABLET | Freq: Two times a day (BID) | ORAL | Status: AC | PRN

## 2011-04-10 MED ORDER — PROMETHAZINE HCL 25 MG/ML IJ SOLN
25.0000 mg | Freq: Once | INTRAMUSCULAR | Status: AC
  Administered 2011-04-10: 25 mg via INTRAMUSCULAR
  Filled 2011-04-10: qty 1

## 2011-04-10 NOTE — ED Notes (Signed)
Patient resting in bed on left side. Pain 3/10 at this time in head. Denies nausea. Denies needs. Call bell within reach. Husband at bedside. Will continue to monitor.

## 2011-04-10 NOTE — Discharge Instructions (Signed)
Please take the medications as prescribed by your doctor, you may add ibuprofen or Advil to this, return to the emergency department for severe or worsening symptoms. We see the attached reading instructions regarding a postconcussion syndrome.

## 2011-04-10 NOTE — ED Notes (Signed)
Into room to medicate patient. States she has been having chest pressure behind left breast since Monday as well. Nonradiating, constant. States it started back just a few minutes ago. No shortness of breath. Pressure 6\10. Nothing makes it better or worse. EKG to be obtained. MD aware. Headache 10\10.

## 2011-04-10 NOTE — ED Provider Notes (Addendum)
History     CSN: [id number]  Arrival date & time 04/09/11  2338   First MD Initiated Contact with Patient 04/09/11 2358      Chief Complaint  Patient presents with  . Fall  . Headache    (Consider location/radiation/quality/duration/timing/severity/associated sxs/prior treatment) HPI Comments: Pt is a 46 year old female with a history of chronic pain, fibromyalgia, head injury approximately 12 days ago who presents with ongoing pain in the posterior neck and left neck. This is persistent over the last 3 days, she has not reinjured her neck or head. The pain is throbbing, pulsating, not associated with changes in vision, numbness, weakness or ataxia. She does have a history of migraine headaches but they present different than this. After her head injury she had a normal CT scan and the next day had a normal MRI showing no traumatic injury to the brain.  She has taken ibuprofen, hydrocodone today without relief.  She denies fevers, stiff neck, back pain, chest pain, palpitations, shortness of breath, abdominal pain, rashes, swelling. She does admit to having nausea but has some acid reflux.  Patient is a 46 y.o. female presenting with fall and headaches. The history is provided by the patient, a relative and medical records.  Fall Associated symptoms include headaches.  Headache     Past Medical History  Diagnosis Date  . Fibromyalgia   . Depression   . PTSD (post-traumatic stress disorder)   . Chronic back pain   . Neuropathy     left foot  . Hypertension   . Migraine headache   . Tachycardia     Past Surgical History  Procedure Date  . Back surgery   . Breast surgery   . Cholecystectomy   . Abdominal hysterectomy   . Cesarean section   . Dilation and curettage of uterus     No family history on file.  History  Substance Use Topics  . Smoking status: Never Smoker   . Smokeless tobacco: Not on file  . Alcohol Use: Yes     ocassional    OB History    Grav Para  Term Preterm Abortions TAB SAB Ect Mult Living                  Review of Systems  Neurological: Positive for headaches.  All other systems reviewed and are negative.    Allergies  Sulfa antibiotics; Bee; Morphine and related; and Latex  Home Medications   Current Outpatient Rx  Name Route Sig Dispense Refill  . ALPRAZOLAM 0.25 MG PO TABS Oral Take 1 tablet (0.25 mg total) by mouth 3 (three) times daily as needed for anxiety. 30 tablet 0  . BUTALBITAL-APAP-CAFFEINE 50-325-40 MG PO TABS Oral Take 1 tablet by mouth 2 (two) times daily as needed. For headaches    . VITAMIN D PO Oral Take 2 tablets by mouth daily.    . OMEGA-3 FATTY ACIDS 1000 MG PO CAPS Oral Take 2 g by mouth daily.    Marland Kitchen GABAPENTIN 800 MG PO TABS Oral Take 1,600 mg by mouth 2 (two) times daily.    Marland Kitchen HYDROCODONE-ACETAMINOPHEN 5-325 MG PO TABS Oral Take 1-2 tablets by mouth every 6 (six) hours as needed. For pain 30 tablet 0  . PROMETHAZINE HCL 25 MG PO TABS Oral Take 25 mg by mouth every 6 (six) hours as needed. For nausea    . TRAMADOL HCL 50 MG PO TABS Oral Take 50 mg by mouth every 6 (six) hours as  needed. pain    . VERAPAMIL HCL 80 MG PO TABS Oral Take 80 mg by mouth 2 (two) times daily.        BP 152/110  Pulse 80  Temp(Src) 98.6 F (37 C) (Oral)  Resp 20  SpO2 99%  Physical Exam  Nursing note and vitals reviewed. Constitutional: She appears well-developed and well-nourished. No distress.  HENT:  Head: Normocephalic and atraumatic.  Mouth/Throat: Oropharynx is clear and moist. No oropharyngeal exudate.  Eyes: Conjunctivae and EOM are normal. Pupils are equal, round, and reactive to light. Right eye exhibits no discharge. Left eye exhibits no discharge. No scleral icterus.  Neck: Normal range of motion. Neck supple. No JVD present. No thyromegaly present.       No bruit, tenderness as described in the MSK section  Cardiovascular: Normal rate, regular rhythm, normal heart sounds and intact distal pulses.   Exam reveals no gallop and no friction rub.   No murmur heard. Pulmonary/Chest: Effort normal and breath sounds normal. No respiratory distress. She has no wheezes. She has no rales.  Abdominal: Soft. Bowel sounds are normal. She exhibits no distension and no mass. There is no tenderness.  Musculoskeletal: Normal range of motion. She exhibits tenderness ( Tender to palpation along the left paraspinal muscles down into the left trap and strap muscles.  This is exactly reproduced with palpation and range of motion of the neck). She exhibits no edema.  Lymphadenopathy:    She has no cervical adenopathy.  Neurological: She is alert. Coordination normal.       Neurologic exam:  Speech clear, pupils equal round reactive to light, extraocular movements intact  Normal peripheral visual fields Cranial nerves III through XII normal including no facial droop Follows commands, moves all extremities x4, normal strength to bilateral upper and lower extremities at all major muscle groups including grip Sensation normal to light touch and pinprick Coordination intact, no limb ataxia, finger-nose-finger normal Rapid alternating movements normal No pronator drift Gait normal   Skin: Skin is warm and dry. No rash noted. No erythema.  Psychiatric: She has a normal mood and affect. Her behavior is normal.    ED Course  Procedures (including critical care time)  ED ECG REPORT   Date: 04/10/2011   Rate: 80  Rhythm: normal sinus rhythm  QRS Axis: normal  Intervals: normal  ST/T Wave abnormalities: normal  Conduction Disutrbances:none  Narrative Interpretation: Normal ECG  Old EKG Reviewed: Compared with 03/29/2011, rate is slower, compared with 12/30/2010, rate is slower. No other acute changes      Labs Reviewed - No data to display No results found.   1. Neck pain on left side       MDM   patient has a normal neurologic exam, has chronic neuropathy in her left leg which is at baseline,  has reproducible tenderness in the muscles of her neck down into her trap muscle consistent with muscle strain or sprain. Vital signs reflecting hypertension but no fevers or tachycardia, she has had adequate imaging of her head which I personally reviewed, specifically the MRI done on March 4 shows no acute findings. We'll proceed with intramuscular medications, observation in ED  ECG performed by nursing b/c pt states has had CP for several days with reflux - tums at home improve sx. ECG normal, no ischemia.  Repeat evaluation, the patient finds no relief with intramuscular Robaxin or Toradol, as good relief with parenteral hydromorphone. Patient now admits that over the last 2 weeks  she has had some change in her personality, more sarcastic than usual. Is back to a component of her illness is a postconcussive syndrome and we'll need to followup closely with family doctor or possible neurologist for neuropsychiatric testing should her symptoms continue. I've explained these findings to the patient and her friend, they are in agreement with followup.  There are no neurologic symptoms, no fever, no significant abnormal vital signs, blood pressure rechecked and is in a more normal range.    Vida Roller, MD 04/10/11 1610  Vida Roller, MD 04/10/11 [phone]

## 2011-04-10 NOTE — ED Notes (Signed)
MD at bedside. 

## 2011-04-10 NOTE — ED Notes (Signed)
Pain 8\10 in head. Pressure in chest remains the same. Ice pack placed on back of neck per request. A&O x 4. Husband with patient. Call bell at bedside. Will continue to monitor. MD aware. BP 139\89.

## 2011-04-10 NOTE — ED Notes (Signed)
Medicated per md order. No distress. Equal chest rise and fall, regular unlabored. Will continue to monitor. Pain 8/10.

## 2011-04-10 NOTE — ED Notes (Signed)
Into room to assess patient. Patient sitting up to side of bed. States she had a fall on the second. Was admitted to the hospital for observation but CTs came back negative. Started having a constant, sharp headache that started Monday at left base of head. Denies any blurred vision but is seeing spots. Nothing makes pain better or worse. Denies any weaknesses. A&O x 4.

## 2011-06-16 DIAGNOSIS — R079 Chest pain, unspecified: Secondary | ICD-10-CM

## 2011-06-18 ENCOUNTER — Other Ambulatory Visit (HOSPITAL_COMMUNITY): Payer: Self-pay | Admitting: Nurse Practitioner

## 2011-06-18 DIAGNOSIS — Z139 Encounter for screening, unspecified: Secondary | ICD-10-CM

## 2011-06-18 DIAGNOSIS — Z09 Encounter for follow-up examination after completed treatment for conditions other than malignant neoplasm: Secondary | ICD-10-CM

## 2011-06-25 ENCOUNTER — Ambulatory Visit (HOSPITAL_COMMUNITY): Payer: Self-pay

## 2011-06-26 ENCOUNTER — Other Ambulatory Visit: Payer: Self-pay | Admitting: Neurology

## 2011-06-26 DIAGNOSIS — M549 Dorsalgia, unspecified: Secondary | ICD-10-CM

## 2011-06-27 ENCOUNTER — Ambulatory Visit (HOSPITAL_COMMUNITY)
Admission: RE | Admit: 2011-06-27 | Discharge: 2011-06-27 | Disposition: A | Discharge status: Home / Self Care | Payer: Medicaid Other | Source: Ambulatory Visit | Attending: Neurology | Admitting: Neurology

## 2011-06-27 DIAGNOSIS — M546 Pain in thoracic spine: Secondary | ICD-10-CM | POA: Insufficient documentation

## 2011-06-27 DIAGNOSIS — M549 Dorsalgia, unspecified: Secondary | ICD-10-CM

## 2011-06-27 DIAGNOSIS — IMO0002 Reserved for concepts with insufficient information to code with codable children: Secondary | ICD-10-CM | POA: Insufficient documentation

## 2011-07-02 ENCOUNTER — Inpatient Hospital Stay (HOSPITAL_COMMUNITY): Admission: RE | Admit: 2011-07-02 | Payer: Self-pay | Source: Ambulatory Visit

## 2011-07-09 ENCOUNTER — Ambulatory Visit (HOSPITAL_COMMUNITY)
Admission: RE | Admit: 2011-07-09 | Discharge: 2011-07-09 | Disposition: A | Discharge status: Home / Self Care | Payer: PRIVATE HEALTH INSURANCE | Source: Ambulatory Visit | Attending: Nurse Practitioner | Admitting: Nurse Practitioner

## 2011-07-09 DIAGNOSIS — Z09 Encounter for follow-up examination after completed treatment for conditions other than malignant neoplasm: Secondary | ICD-10-CM | POA: Insufficient documentation

## 2011-07-09 DIAGNOSIS — N63 Unspecified lump in unspecified breast: Secondary | ICD-10-CM | POA: Insufficient documentation

## 2011-07-25 ENCOUNTER — Emergency Department (HOSPITAL_COMMUNITY)
Admission: EM | Admit: 2011-07-25 | Discharge: 2011-07-25 | Disposition: A | Discharge status: Home / Self Care | Payer: Self-pay | Attending: Emergency Medicine | Admitting: Emergency Medicine

## 2011-07-25 ENCOUNTER — Encounter (HOSPITAL_COMMUNITY): Payer: Self-pay | Admitting: *Deleted

## 2011-07-25 DIAGNOSIS — IMO0002 Reserved for concepts with insufficient information to code with codable children: Secondary | ICD-10-CM | POA: Insufficient documentation

## 2011-07-25 DIAGNOSIS — Z881 Allergy status to other antibiotic agents status: Secondary | ICD-10-CM | POA: Insufficient documentation

## 2011-07-25 DIAGNOSIS — Z9089 Acquired absence of other organs: Secondary | ICD-10-CM | POA: Insufficient documentation

## 2011-07-25 DIAGNOSIS — Z90710 Acquired absence of both cervix and uterus: Secondary | ICD-10-CM | POA: Insufficient documentation

## 2011-07-25 DIAGNOSIS — G8929 Other chronic pain: Secondary | ICD-10-CM

## 2011-07-25 DIAGNOSIS — J189 Pneumonia, unspecified organism: Secondary | ICD-10-CM

## 2011-07-25 DIAGNOSIS — F3289 Other specified depressive episodes: Secondary | ICD-10-CM | POA: Insufficient documentation

## 2011-07-25 DIAGNOSIS — I10 Essential (primary) hypertension: Secondary | ICD-10-CM | POA: Insufficient documentation

## 2011-07-25 DIAGNOSIS — Z91040 Latex allergy status: Secondary | ICD-10-CM | POA: Insufficient documentation

## 2011-07-25 DIAGNOSIS — G43909 Migraine, unspecified, not intractable, without status migrainosus: Secondary | ICD-10-CM | POA: Insufficient documentation

## 2011-07-25 DIAGNOSIS — F329 Major depressive disorder, single episode, unspecified: Secondary | ICD-10-CM | POA: Insufficient documentation

## 2011-07-25 DIAGNOSIS — F4310 Post-traumatic stress disorder, unspecified: Secondary | ICD-10-CM | POA: Insufficient documentation

## 2011-07-25 DIAGNOSIS — IMO0001 Reserved for inherently not codable concepts without codable children: Secondary | ICD-10-CM | POA: Insufficient documentation

## 2011-07-25 MED ORDER — PREDNISONE 10 MG PO TABS: 20.0000 mg | ORAL_TABLET | Freq: Every day | ORAL | Status: DC

## 2011-07-25 MED ORDER — OXYCODONE-ACETAMINOPHEN 5-325 MG PO TABS
2.0000 | ORAL_TABLET | Freq: Once | ORAL | Status: AC
  Administered 2011-07-25: 2 via ORAL
  Filled 2011-07-25: qty 2

## 2011-07-25 MED ORDER — PREDNISONE 20 MG PO TABS
60.0000 mg | ORAL_TABLET | Freq: Once | ORAL | Status: AC
  Administered 2011-07-25: 60 mg via ORAL
  Filled 2011-07-25: qty 3

## 2011-07-25 MED ORDER — KETOROLAC TROMETHAMINE 60 MG/2ML IM SOLN
60.0000 mg | Freq: Once | INTRAMUSCULAR | Status: AC
  Administered 2011-07-25: 60 mg via INTRAMUSCULAR
  Filled 2011-07-25: qty 2

## 2011-07-25 NOTE — ED Notes (Addendum)
Pt discharged.  Reporting no relief from pain.  Explained to pt that medications were administered within last hour and have not had time to reach full effectiveness.  Pt demanding Dilaudid be given.  Discussed with Dr. Rosalia Hammers.  No additional narcotics given.  Pt very angry about not being given additional pain medication or prescription for them.  Reinforced with pt to follow up with primary physician or pain management doctor for pain not being controlled with current treatment.

## 2011-07-25 NOTE — Discharge Instructions (Signed)
Please take medicine as discussed and have your bp rechecked with your doctor next week after restarting meds.

## 2011-07-25 NOTE — ED Notes (Signed)
Pt c/o pain in her lower back radiating down her left leg that has been worse since Tuesday. Pt states that she has nerve damage in her back and leg. Pt also c/o headache. Pt alert and oriented x 3. Skin warm and dry. Color pink. Breath sounds clear and equal bilaterally. Pt lying in dark room on her right side.

## 2011-07-25 NOTE — ED Provider Notes (Signed)
History     CSN: [id number]  Arrival date & time 07/25/11  1757   First MD Initiated Contact with Patient 07/25/11 1806      Chief Complaint  Patient presents with  . Back Pain    (Consider location/radiation/quality/duration/timing/severity/associated sxs/prior treatment) HPI Patient with chronic back pain with radiculopathy to left buttock and leg posterior thigh and lateral.  Pain worse since Tuesday.  No new injury. n Followed by Dr. Gerilyn Pilgrim and treated with gabapentin, robaxin, and ibuprofen.  States out of tramadol although in pharmacy for refill but out of meds.  States took one of husband's vicodin without relief.   Past Medical History  Diagnosis Date  . Fibromyalgia   . Depression   . PTSD (post-traumatic stress disorder)   . Chronic back pain   . Neuropathy     left foot  . Hypertension   . Migraine headache   . Tachycardia     Past Surgical History  Procedure Date  . Back surgery   . Breast surgery   . Cholecystectomy   . Abdominal hysterectomy   . Cesarean section   . Dilation and curettage of uterus     History reviewed. No pertinent family history.  History  Substance Use Topics  . Smoking status: Never Smoker   . Smokeless tobacco: Not on file  . Alcohol Use: Yes     ocassional    OB History    Grav Para Term Preterm Abortions TAB SAB Ect Mult Living                  Review of Systems  All other systems reviewed and are negative.    Allergies  Sulfa antibiotics; Morphine and related; Nutritional supplements; and Latex  Home Medications   Current Outpatient Rx  Name Route Sig Dispense Refill  . BUTALBITAL-APAP-CAFFEINE 50-325-40 MG PO TABS Oral Take 1 tablet by mouth 2 (two) times daily as needed. For headaches    . VITAMIN D PO Oral Take 2 tablets by mouth daily.    . OMEGA-3 FATTY ACIDS 1000 MG PO CAPS Oral Take 2 g by mouth daily.    Marland Kitchen GABAPENTIN 800 MG PO TABS Oral Take 1,600 mg by mouth 2 (two) times daily.    Marland Kitchen  HYDROCODONE-ACETAMINOPHEN 5-325 MG PO TABS Oral Take 1-2 tablets by mouth every 6 (six) hours as needed. For pain 30 tablet 0  . PROMETHAZINE HCL 25 MG PO TABS Oral Take 25 mg by mouth every 6 (six) hours as needed. For nausea    . TRAMADOL HCL 50 MG PO TABS Oral Take 50 mg by mouth every 6 (six) hours as needed. pain    . VERAPAMIL HCL 80 MG PO TABS Oral Take 80 mg by mouth 2 (two) times daily.        BP 175/113  Pulse 87  Temp 97.6 F (36.4 C) (Oral)  Resp 20  Ht 5\' 5"  (1.651 m)  Wt 181 lb (82.101 kg)  BMI 30.12 kg/m2  SpO2 96%  Physical Exam  Nursing note and vitals reviewed. Constitutional: She is oriented to person, place, and time. She appears well-developed and well-nourished.  HENT:  Head: Normocephalic and atraumatic.  Eyes: Conjunctivae and EOM are normal. Pupils are equal, round, and reactive to light.  Neck: Normal range of motion. Neck supple.  Cardiovascular: Normal rate, regular rhythm, normal heart sounds and intact distal pulses.   Pulmonary/Chest: Effort normal and breath sounds normal.  Abdominal: Soft. Bowel sounds are normal.  Musculoskeletal: Normal range of motion.  Neurological: She is alert and oriented to person, place, and time. She has normal strength. No sensory deficit. GCS eye subscore is 4. GCS verbal subscore is 5. GCS motor subscore is 6.  Reflex Scores:      Patellar reflexes are 2+ on the right side and 2+ on the left side.      Achilles reflexes are 2+ on the right side and 0 on the left side. Skin: Skin is warm and dry.  Psychiatric: She has a normal mood and affect. Thought content normal.    ED Course  Procedures (including critical care time)  Labs Reviewed - No data to display No results found.   No diagnosis found.    MDM  Plan prednisone and percocet one time here.  Patient request toradol and plan dose here but advised not to continue nsaid use with prednisone.   Patient states left ankle jerk not present since 2010 with  original injury.         Hilario Quarry, MD 07/25/11 [phone]

## 2011-07-25 NOTE — ED Notes (Addendum)
Low back pain and lt leg pain.  Out of bp pills for 1 week.

## 2011-11-03 ENCOUNTER — Encounter (HOSPITAL_COMMUNITY): Payer: Self-pay | Admitting: *Deleted

## 2011-11-03 ENCOUNTER — Emergency Department (HOSPITAL_COMMUNITY): Payer: Self-pay

## 2011-11-03 ENCOUNTER — Emergency Department (HOSPITAL_COMMUNITY)
Admission: EM | Admit: 2011-11-03 | Discharge: 2011-11-03 | Disposition: A | Discharge status: Home / Self Care | Payer: Self-pay | Attending: Emergency Medicine | Admitting: Emergency Medicine

## 2011-11-03 DIAGNOSIS — S63509A Unspecified sprain of unspecified wrist, initial encounter: Secondary | ICD-10-CM | POA: Insufficient documentation

## 2011-11-03 DIAGNOSIS — Y92009 Unspecified place in unspecified non-institutional (private) residence as the place of occurrence of the external cause: Secondary | ICD-10-CM | POA: Insufficient documentation

## 2011-11-03 DIAGNOSIS — W2209XA Striking against other stationary object, initial encounter: Secondary | ICD-10-CM | POA: Insufficient documentation

## 2011-11-03 DIAGNOSIS — Z79899 Other long term (current) drug therapy: Secondary | ICD-10-CM | POA: Insufficient documentation

## 2011-11-03 DIAGNOSIS — I10 Essential (primary) hypertension: Secondary | ICD-10-CM | POA: Insufficient documentation

## 2011-11-03 DIAGNOSIS — G8929 Other chronic pain: Secondary | ICD-10-CM | POA: Insufficient documentation

## 2011-11-03 DIAGNOSIS — S63501A Unspecified sprain of right wrist, initial encounter: Secondary | ICD-10-CM

## 2011-11-03 MED ORDER — TRAMADOL HCL 50 MG PO TABS
50.0000 mg | ORAL_TABLET | Freq: Once | ORAL | Status: AC
  Administered 2011-11-03: 50 mg via ORAL
  Filled 2011-11-03: qty 1

## 2011-11-03 MED ORDER — IBUPROFEN 600 MG PO TABS: 600.0000 mg | ORAL_TABLET | Freq: Four times a day (QID) | ORAL | Status: DC | PRN

## 2011-11-03 MED ORDER — TRAMADOL HCL 50 MG PO TABS: 50.0000 mg | ORAL_TABLET | Freq: Four times a day (QID) | ORAL | Status: DC | PRN

## 2011-11-03 NOTE — ED Notes (Signed)
Pain rt wrist, onset yesterday when twisting a dog tie down out of the ground

## 2011-11-04 NOTE — ED Provider Notes (Signed)
History     CSN: [id number]  Arrival date & time 11/03/11  1009   First MD Initiated Contact with Patient 11/03/11 1104      Chief Complaint  Patient presents with  . Wrist Pain    (Consider location/radiation/quality/duration/timing/severity/associated sxs/prior treatment) HPI Comments: Ashley [name] presents with right wrist pain which occurred when her right hand slipped as she was trying to unscrew a dog tie out from the ground yesterday,  Hitting her wrist on the metal device.  She has tried using ice on the injury but her pain and swelling persists.  It is constant,  Nonradiating,  Worse with palpation and ROM.  She denies numbness and tingling in her fingers and hand.  The history is provided by the patient.    Past Medical History  Diagnosis Date  . Fibromyalgia   . Depression   . PTSD (post-traumatic stress disorder)   . Chronic back pain   . Neuropathy     left foot  . Hypertension   . Migraine headache   . Tachycardia     Past Surgical History  Procedure Date  . Back surgery   . Cholecystectomy   . Abdominal hysterectomy   . Cesarean section   . Dilation and curettage of uterus   . Breast surgery     History reviewed. No pertinent family history.  History  Substance Use Topics  . Smoking status: Never Smoker   . Smokeless tobacco: Not on file  . Alcohol Use: Yes     ocassional    OB History    Grav Para Term Preterm Abortions TAB SAB Ect Mult Living                  Review of Systems  Musculoskeletal: Positive for joint swelling and arthralgias.  Skin: Negative for wound.  Neurological: Negative for weakness and numbness.    Allergies  Sulfa antibiotics; Bee venom; Morphine and related; and Latex  Home Medications   Current Outpatient Rx  Name Route Sig Dispense Refill  . BUTALBITAL-APAP-CAFFEINE 50-325-40 MG PO TABS Oral Take 1 tablet by mouth 2 (two) times daily as needed. For headaches    . CYCLOBENZAPRINE HCL 10 MG PO TABS Oral  Take 10 mg by mouth 3 (three) times daily as needed. Muscle spasms.    . DULOXETINE HCL 60 MG PO CPEP Oral Take 60 mg by mouth 2 (two) times daily.    Marland Kitchen GABAPENTIN 800 MG PO TABS Oral Take 800 mg by mouth 3 (three) times daily.     Marland Kitchen PROMETHAZINE HCL 25 MG PO TABS Oral Take 25 mg by mouth every 6 (six) hours as needed. For nausea    . PSEUDOEPHEDRINE HCL ER 120 MG PO TB12 Oral Take 120 mg by mouth every 12 (twelve) hours as needed. Allergies.    . TRAMADOL HCL 50 MG PO TABS Oral Take 50 mg by mouth every 6 (six) hours as needed. pain    . VERAPAMIL HCL ER 180 MG PO TBCR Oral Take 180 mg by mouth daily.    Marland Kitchen ALPRAZOLAM 0.5 MG PO TABS Oral Take 0.5 mg by mouth daily as needed. Anxiety.    . IBUPROFEN 600 MG PO TABS Oral Take 1 tablet (600 mg total) by mouth every 6 (six) hours as needed for pain. 30 tablet 0  . TRAMADOL HCL 50 MG PO TABS Oral Take 1 tablet (50 mg total) by mouth every 6 (six) hours as needed for pain. 30 tablet  0    BP 135/92  Pulse 89  Temp 99 F (37.2 C) (Oral)  Resp 20  Ht 5\' 4"  (1.626 m)  Wt 183 lb (83.008 kg)  BMI 31.41 kg/m2  SpO2 99%  Physical Exam  Constitutional: She appears well-developed and well-nourished.  HENT:  Head: Atraumatic.  Neck: Normal range of motion.  Cardiovascular:       Pulses equal bilaterally  Musculoskeletal: She exhibits tenderness.       Right wrist: She exhibits bony tenderness and swelling. She exhibits normal range of motion, no crepitus and no deformity.       Tender to palpation over distal radius of right upper extremity with ecchymosis and minimal edema.  Skin is intact, no snuffbox tenderness, although patient does have increased pain at the wrist with resisted flexion of the thumb.  Distal sensation is intact, less than 3 second cap refill in all digits.  No forearm or elbow pain.  Neurological: She is alert. She has normal strength. She displays normal reflexes. No sensory deficit.       Equal strength  Skin: Skin is warm and  dry.  Psychiatric: She has a normal mood and affect.    ED Course  Procedures (including critical care time)  Labs Reviewed - No data to display Dg Wrist Complete Right  11/03/2011  *RADIOLOGY REPORT*  Clinical Data: Wrist pain (ulnar side)  RIGHT WRIST - COMPLETE 3+ VIEW  Comparison: 11/18/2010  Findings: No fracture or dislocation is seen.  The joint spaces are preserved.  The visualized soft tissues are unremarkable.  IMPRESSION: No fracture or dislocation is seen.   Original Report Authenticated By: Charline Bills, M.D.      1. Sprain of wrist, right     Velcro wrist splint applied by RN.  Examined post application, patient with good cap refill.  MDM  Suspect contusion at wrist, x-rays reviewed and negative for bony injury.  She was prescribed tramadol and ibuprofen.  Encouraged continued ice and elevation for the next several days.  Recheck by PCP if not improving over the next week.  She was also given referral to Dr. Hilda Lias when necessary if symptoms persist or worsen.        Burgess Amor, [place] 11/04/11 1810

## 2011-11-05 NOTE — ED Provider Notes (Signed)
Medical screening examination/treatment/procedure(s) were performed by non-physician practitioner and as supervising physician I was immediately available for consultation/collaboration.   Shelda Jakes, MD 11/05/11 1242

## 2012-02-13 ENCOUNTER — Other Ambulatory Visit (HOSPITAL_COMMUNITY): Payer: Self-pay | Admitting: Nurse Practitioner

## 2012-02-13 DIAGNOSIS — N6452 Nipple discharge: Secondary | ICD-10-CM

## 2012-02-18 ENCOUNTER — Encounter (HOSPITAL_COMMUNITY): Payer: Self-pay

## 2012-03-10 ENCOUNTER — Other Ambulatory Visit (HOSPITAL_COMMUNITY): Payer: Self-pay | Admitting: Nurse Practitioner

## 2012-03-10 ENCOUNTER — Ambulatory Visit (HOSPITAL_COMMUNITY)
Admission: RE | Admit: 2012-03-10 | Discharge: 2012-03-10 | Disposition: A | Discharge status: Home / Self Care | Payer: PRIVATE HEALTH INSURANCE | Source: Ambulatory Visit | Attending: Family Medicine | Admitting: Family Medicine

## 2012-03-10 DIAGNOSIS — N6452 Nipple discharge: Secondary | ICD-10-CM

## 2012-03-10 DIAGNOSIS — N6459 Other signs and symptoms in breast: Secondary | ICD-10-CM | POA: Insufficient documentation

## 2012-05-11 ENCOUNTER — Emergency Department (HOSPITAL_COMMUNITY)
Admission: EM | Admit: 2012-05-11 | Discharge: 2012-05-11 | Disposition: A | Discharge status: Home / Self Care | Payer: Self-pay | Attending: Emergency Medicine | Admitting: Emergency Medicine

## 2012-05-11 ENCOUNTER — Encounter (HOSPITAL_COMMUNITY): Payer: Self-pay | Admitting: *Deleted

## 2012-05-11 DIAGNOSIS — K2970 Gastritis, unspecified, without bleeding: Secondary | ICD-10-CM | POA: Insufficient documentation

## 2012-05-11 DIAGNOSIS — M549 Dorsalgia, unspecified: Secondary | ICD-10-CM | POA: Insufficient documentation

## 2012-05-11 DIAGNOSIS — Z9089 Acquired absence of other organs: Secondary | ICD-10-CM | POA: Insufficient documentation

## 2012-05-11 DIAGNOSIS — R197 Diarrhea, unspecified: Secondary | ICD-10-CM | POA: Insufficient documentation

## 2012-05-11 DIAGNOSIS — G589 Mononeuropathy, unspecified: Secondary | ICD-10-CM | POA: Insufficient documentation

## 2012-05-11 DIAGNOSIS — Z8719 Personal history of other diseases of the digestive system: Secondary | ICD-10-CM | POA: Insufficient documentation

## 2012-05-11 DIAGNOSIS — F4310 Post-traumatic stress disorder, unspecified: Secondary | ICD-10-CM | POA: Insufficient documentation

## 2012-05-11 DIAGNOSIS — K219 Gastro-esophageal reflux disease without esophagitis: Secondary | ICD-10-CM | POA: Insufficient documentation

## 2012-05-11 DIAGNOSIS — F3289 Other specified depressive episodes: Secondary | ICD-10-CM | POA: Insufficient documentation

## 2012-05-11 DIAGNOSIS — Z90710 Acquired absence of both cervix and uterus: Secondary | ICD-10-CM | POA: Insufficient documentation

## 2012-05-11 DIAGNOSIS — IMO0001 Reserved for inherently not codable concepts without codable children: Secondary | ICD-10-CM | POA: Insufficient documentation

## 2012-05-11 DIAGNOSIS — Z8679 Personal history of other diseases of the circulatory system: Secondary | ICD-10-CM | POA: Insufficient documentation

## 2012-05-11 DIAGNOSIS — G8929 Other chronic pain: Secondary | ICD-10-CM | POA: Insufficient documentation

## 2012-05-11 DIAGNOSIS — G43909 Migraine, unspecified, not intractable, without status migrainosus: Secondary | ICD-10-CM | POA: Insufficient documentation

## 2012-05-11 DIAGNOSIS — Z79899 Other long term (current) drug therapy: Secondary | ICD-10-CM | POA: Insufficient documentation

## 2012-05-11 DIAGNOSIS — I10 Essential (primary) hypertension: Secondary | ICD-10-CM | POA: Insufficient documentation

## 2012-05-11 DIAGNOSIS — F329 Major depressive disorder, single episode, unspecified: Secondary | ICD-10-CM | POA: Insufficient documentation

## 2012-05-11 HISTORY — DX: Gastric ulcer, unspecified as acute or chronic, without hemorrhage or perforation: K25.9

## 2012-05-11 LAB — URINALYSIS, ROUTINE W REFLEX MICROSCOPIC
Hgb urine dipstick: NEGATIVE
Ketones, ur: NEGATIVE mg/dL
Protein, ur: NEGATIVE mg/dL
Urobilinogen, UA: 0.2 mg/dL (ref 0.0–1.0)

## 2012-05-11 LAB — COMPREHENSIVE METABOLIC PANEL
Albumin: 4 g/dL (ref 3.5–5.2)
BUN: 9 mg/dL (ref 6–23)
Calcium: 9.4 mg/dL (ref 8.4–10.5)
Creatinine, Ser: 0.69 mg/dL (ref 0.50–1.10)
Potassium: 3.6 mEq/L (ref 3.5–5.1)
Total Protein: 7 g/dL (ref 6.0–8.3)

## 2012-05-11 LAB — CBC WITH DIFFERENTIAL/PLATELET
Basophils Relative: 0 % (ref 0–1)
Eosinophils Absolute: 0.1 10*3/uL (ref 0.0–0.7)
Eosinophils Relative: 1 % (ref 0–5)
Hemoglobin: 13.8 g/dL (ref 12.0–15.0)
MCH: 30.1 pg (ref 26.0–34.0)
MCHC: 34.8 g/dL (ref 30.0–36.0)
Monocytes Absolute: 0.5 10*3/uL (ref 0.1–1.0)
Monocytes Relative: 6 % (ref 3–12)
Neutrophils Relative %: 70 % (ref 43–77)

## 2012-05-11 MED ORDER — RANITIDINE HCL 150 MG PO TABS: 150.0000 mg | ORAL_TABLET | Freq: Two times a day (BID) | ORAL | Status: DC

## 2012-05-11 MED ORDER — FAMOTIDINE IN NACL 20-0.9 MG/50ML-% IV SOLN
20.0000 mg | Freq: Once | INTRAVENOUS | Status: AC
  Administered 2012-05-11: 20 mg via INTRAVENOUS
  Filled 2012-05-11: qty 50

## 2012-05-11 MED ORDER — SUCRALFATE 1 GM/10ML PO SUSP: 1.0000 g | Freq: Four times a day (QID) | ORAL | Status: DC

## 2012-05-11 MED ORDER — GI COCKTAIL ~~LOC~~
30.0000 mL | Freq: Once | ORAL | Status: AC
  Administered 2012-05-11: 30 mL via ORAL
  Filled 2012-05-11: qty 30

## 2012-05-11 NOTE — ED Provider Notes (Signed)
History     CSN: [id number]  Arrival date & time 05/11/12  1249   First MD Initiated Contact with Patient 05/11/12 1258      Chief Complaint  Patient presents with  . Abdominal Pain    (Consider location/radiation/quality/duration/timing/severity/associated sxs/prior treatment) HPI Comments: Patient presents to the ER with complaints of abdominal pain. Patient has been having somewhat chronic left upper abdominal pain for some time. She previously saw Dr. Neita Carp and was placed on Protonix. She has run out of the Protonix and states it didn't really help anyway. In the last week, pain has become more intense. She is now a constant cramping pain in the abdomen. She has also developed diarrhea. No blood in her stools. No vomiting.  Patient is a 47 y.o. female presenting with abdominal pain.  Abdominal Pain Associated symptoms: diarrhea     Past Medical History  Diagnosis Date  . Fibromyalgia   . Depression   . PTSD (post-traumatic stress disorder)   . Chronic back pain   . Neuropathy     left foot  . Hypertension   . Migraine headache   . Tachycardia   . Gastric ulcer     Past Surgical History  Procedure Laterality Date  . Back surgery    . Cholecystectomy    . Abdominal hysterectomy    . Cesarean section    . Dilation and curettage of uterus    . Breast surgery      No family history on file.  History  Substance Use Topics  . Smoking status: Never Smoker   . Smokeless tobacco: Not on file  . Alcohol Use: Yes     Comment: ocassional    OB History   Grav Para Term Preterm Abortions TAB SAB Ect Mult Living                  Review of Systems  Gastrointestinal: Positive for abdominal pain and diarrhea.  All other systems reviewed and are negative.    Allergies  Sulfa antibiotics; Bee venom; Morphine and related; and Latex  Home Medications   Current Outpatient Rx  Name  Route  Sig  Dispense  Refill  . ALPRAZolam (XANAX) 0.5 MG tablet   Oral   Take  0.5 mg by mouth daily as needed. Anxiety.         . butalbital-acetaminophen-caffeine (FIORICET, ESGIC) 50-325-40 MG per tablet   Oral   Take 1 tablet by mouth 2 (two) times daily as needed. For headaches         . cyclobenzaprine (FLEXERIL) 10 MG tablet   Oral   Take 10 mg by mouth 3 (three) times daily as needed. Muscle spasms.         . DULoxetine (CYMBALTA) 60 MG capsule   Oral   Take 60 mg by mouth 2 (two) times daily.         Marland Kitchen gabapentin (NEURONTIN) 800 MG tablet   Oral   Take 800 mg by mouth 3 (three) times daily.          Marland Kitchen ibuprofen (ADVIL,MOTRIN) 600 MG tablet   Oral   Take 1 tablet (600 mg total) by mouth every 6 (six) hours as needed for pain.   30 tablet   0   . promethazine (PHENERGAN) 25 MG tablet   Oral   Take 25 mg by mouth every 6 (six) hours as needed. For nausea         . pseudoephedrine (SUDAFED) 120 MG  12 hr tablet   Oral   Take 120 mg by mouth every 12 (twelve) hours as needed. Allergies.         Marland Kitchen traMADol (ULTRAM) 50 MG tablet   Oral   Take 50 mg by mouth every 6 (six) hours as needed. pain         . traMADol (ULTRAM) 50 MG tablet   Oral   Take 1 tablet (50 mg total) by mouth every 6 (six) hours as needed for pain.   30 tablet   0   . verapamil (CALAN-SR) 180 MG CR tablet   Oral   Take 180 mg by mouth daily.           BP 165/104  Pulse 76  Temp(Src) 98.7 F (37.1 C) (Oral)  Resp 20  Ht 5\' 4"  (1.626 m)  Wt 192 lb (87.091 kg)  BMI 32.94 kg/m2  SpO2 98%  Physical Exam  Constitutional: She is oriented to person, place, and time. She appears well-developed and well-nourished. No distress.  HENT:  Head: Normocephalic and atraumatic.  Right Ear: Hearing normal.  Nose: Nose normal.  Mouth/Throat: Oropharynx is clear and moist and mucous membranes are normal.  Eyes: Conjunctivae and EOM are normal. Pupils are equal, round, and reactive to light.  Neck: Normal range of motion. Neck supple.  Cardiovascular: Normal  rate, regular rhythm, S1 normal and S2 normal.  Exam reveals no gallop and no friction rub.   No murmur heard. Pulmonary/Chest: Effort normal and breath sounds normal. No respiratory distress. She exhibits no tenderness.  Abdominal: Soft. Normal appearance and bowel sounds are normal. There is no hepatosplenomegaly. There is tenderness in the epigastric area and left upper quadrant. There is no rebound, no guarding, no tenderness at McBurney's point and negative Murphy's sign. No hernia.  Musculoskeletal: Normal range of motion.  Neurological: She is alert and oriented to person, place, and time. She has normal strength. No cranial nerve deficit or sensory deficit. Coordination normal. GCS eye subscore is 4. GCS verbal subscore is 5. GCS motor subscore is 6.  Skin: Skin is warm, dry and intact. No rash noted. No cyanosis.  Psychiatric: She has a normal mood and affect. Her speech is normal and behavior is normal. Thought content normal.    ED Course  Procedures (including critical care time)  Labs Reviewed  COMPREHENSIVE METABOLIC PANEL - Abnormal; Notable for the following:    Glucose, Bld 130 (*)    Total Bilirubin 0.2 (*)    All other components within normal limits  CBC WITH DIFFERENTIAL  LIPASE, BLOOD  URINALYSIS, ROUTINE W REFLEX MICROSCOPIC   No results found.   Diagnosis: 1. Abdominal pain 2. Gastritis    MDM  She comes to the ER complaint of left upper abdominal pain. She has had these symptoms before and been told it was secondary to reflux, possibly an ulcer by Dr. Neita Carp. She was placed on Protonix, but has run out/cannot afford it. Patient's abdominal exam is benign. She has slight tenderness in the left upper quadrant and epigastric region, but no guarding or rebound. She is status post cholecystectomy. No abdominal or pelvic tenderness. Patient has not had any hematemesis, melena or rectal bleeding. Hemoglobin is normal. All vital signs are normal. Patient has improved  with IV Pepcid and GI cocktail. Will initiate treatment for acid reflux/gastritis.       Gilda Crease, MD 05/11/12 [phone]

## 2012-05-11 NOTE — ED Notes (Addendum)
Pt left upper quad abd pain associated with nausea and diarrhea that started a week ago, pt states that she has had pain like this before intermittent, unsure of cause

## 2012-05-11 NOTE — ED Notes (Addendum)
Patient reports that she saw MD in the past, who put her on protonix. Reports that this past week pain has been getting increasingly worse and that she took peptobismal this morning. Reports that pain worsened this morning.Patient reports elevated liver enzymes yesterday, and pain in liver area when palpated. Diarrhea for the past three days. No vomiting. History of ulcer.

## 2012-05-12 ENCOUNTER — Other Ambulatory Visit (HOSPITAL_COMMUNITY): Payer: Self-pay | Admitting: Nurse Practitioner

## 2012-05-12 ENCOUNTER — Other Ambulatory Visit: Payer: Self-pay | Admitting: Obstetrics & Gynecology

## 2012-05-12 DIAGNOSIS — R1012 Left upper quadrant pain: Secondary | ICD-10-CM

## 2012-05-18 ENCOUNTER — Ambulatory Visit (HOSPITAL_COMMUNITY)
Admission: RE | Admit: 2012-05-18 | Discharge: 2012-05-18 | Disposition: A | Discharge status: Home / Self Care | Payer: Self-pay | Source: Ambulatory Visit | Attending: Family Medicine | Admitting: Family Medicine

## 2012-05-18 DIAGNOSIS — R1012 Left upper quadrant pain: Secondary | ICD-10-CM

## 2012-05-18 DIAGNOSIS — R109 Unspecified abdominal pain: Secondary | ICD-10-CM | POA: Insufficient documentation

## 2012-06-14 ENCOUNTER — Telehealth (HOSPITAL_COMMUNITY): Payer: Self-pay | Admitting: *Deleted

## 2012-06-14 NOTE — Telephone Encounter (Signed)
Telephoned patient at home # and left message to return call to [facility] 

## 2012-06-18 ENCOUNTER — Other Ambulatory Visit: Payer: Self-pay | Admitting: *Deleted

## 2012-06-18 DIAGNOSIS — N6452 Nipple discharge: Secondary | ICD-10-CM

## 2012-06-18 DIAGNOSIS — N63 Unspecified lump in unspecified breast: Secondary | ICD-10-CM

## 2012-06-22 ENCOUNTER — Encounter (HOSPITAL_COMMUNITY): Payer: Self-pay

## 2012-06-22 ENCOUNTER — Ambulatory Visit (HOSPITAL_COMMUNITY)
Admission: RE | Admit: 2012-06-22 | Discharge: 2012-06-22 | Disposition: A | Discharge status: Home / Self Care | Payer: Medicare Other | Source: Ambulatory Visit | Attending: Obstetrics and Gynecology | Admitting: Obstetrics and Gynecology

## 2012-06-22 VITALS — BP 132/86 | Temp 98.1°F | Ht 64.0 in | Wt 189.2 lb

## 2012-06-22 DIAGNOSIS — N6452 Nipple discharge: Secondary | ICD-10-CM | POA: Insufficient documentation

## 2012-06-22 DIAGNOSIS — Z1239 Encounter for other screening for malignant neoplasm of breast: Secondary | ICD-10-CM

## 2012-06-22 NOTE — Progress Notes (Signed)
Patient complained of left breast lump and bloody discharge x 1.5 weeks. Patient complained of left breast pain that she rates at a 3 out of 10 that comes and goes. Patient referred to [facility] by [facility].  Pap Smear:    Pap smear not completed today. Last Pap smear was in 2010 and normal per patient. Per patient has no history of an abnormal Pap smear. Patient has a history of a hysterectomy in 2007 for fibroids and DUB. Patient does not need any further Pap smears per ACOG and [facility] guidelines since has a history of a hysterectomy for benign reasons. No Pap smear results are in EPIC.  Physical exam: Breasts Left breast larger than right breast. Per patient left breast has increased in size. No skin abnormalities bilateral breasts. No nipple retraction bilateral breasts. No nipple discharge right breast. Clear nipple discharge from left breast when expresses. Unable to express enough nipple discharge to send a sample to cytology. No blood expressed from left nipple. No lymphadenopathy. No lumps palpated right breast. Small lump palpated left breast under areola. Patient complained of left breast tenderness on exam. Referred patient to the [facility] for left breast diagnostic mammogram and possible ultrasound. Appointment scheduled for Monday, June 28, 2012 at 0725     Pelvic/Bimanual No Pap smear completed today since patient has a history of a hysterectomy for benign reasons. Pap smear not indicated per [facility] guidelines.

## 2012-06-22 NOTE — Patient Instructions (Addendum)
Taught patient how to perform BSE. Patient did not need a Pap smear today due to a history of a hysterectomy for benign reasons. Let patient know that will not need any further Pap smears due to a history of a hysterectomy for benign reasons. Referred patient to the [facility] for left breast diagnostic mammogram and possible ultrasound. Appointment scheduled for Monday, June 28, 2012 at 0725. Patient aware of appointment and will be there. Patient verbalized understanding.

## 2012-06-28 ENCOUNTER — Ambulatory Visit
Admission: RE | Admit: 2012-06-28 | Discharge: 2012-06-28 | Disposition: A | Discharge status: Home / Self Care | Payer: No Typology Code available for payment source | Source: Ambulatory Visit | Attending: Obstetrics and Gynecology | Admitting: Obstetrics and Gynecology

## 2012-06-28 ENCOUNTER — Ambulatory Visit
Admission: RE | Admit: 2012-06-28 | Discharge: 2012-06-28 | Disposition: A | Discharge status: Home / Self Care | Payer: Medicare Other | Source: Ambulatory Visit | Attending: Obstetrics and Gynecology | Admitting: Obstetrics and Gynecology

## 2012-06-28 ENCOUNTER — Other Ambulatory Visit: Payer: Self-pay | Admitting: Obstetrics and Gynecology

## 2012-06-28 DIAGNOSIS — N63 Unspecified lump in unspecified breast: Secondary | ICD-10-CM

## 2012-06-28 DIAGNOSIS — N6452 Nipple discharge: Secondary | ICD-10-CM

## 2012-07-07 ENCOUNTER — Encounter (INDEPENDENT_AMBULATORY_CARE_PROVIDER_SITE_OTHER): Payer: Self-pay | Admitting: Surgery

## 2012-07-07 ENCOUNTER — Ambulatory Visit (INDEPENDENT_AMBULATORY_CARE_PROVIDER_SITE_OTHER): Payer: PRIVATE HEALTH INSURANCE | Admitting: Surgery

## 2012-07-07 VITALS — BP 120/78 | HR 72 | Temp 98.0°F | Resp 17 | Ht 64.0 in | Wt 187.6 lb

## 2012-07-07 DIAGNOSIS — N6452 Nipple discharge: Secondary | ICD-10-CM

## 2012-07-07 DIAGNOSIS — N6459 Other signs and symptoms in breast: Secondary | ICD-10-CM

## 2012-07-07 NOTE — Progress Notes (Signed)
Patient ID: Ashley [name], female   DOB: 1966-01-20, 47 y.o.   MRN: [id number]  Chief Complaint  Patient presents with  . New Evaluation    eval l intraductal breast mass    HPI Ashley [name] is a 47 y.o. female.  Referred by Dr. Judyann Munson for evaluation of bloody nipple discharge HPI This is a 47 year old female with chronic back pain who presents with about 10 months of clear nipple discharge. However last month this nipple discharge on the left showed some bloody drainage. She was evaluated with a diagnostic mammogram on 06/28/12.  Bloody discharge was noted coming from a single duct in the lower central aspect of the left nipple. An ultrasound showed a moderately dilated duct in the subareolar region at 12:00. This contains an 8 mm intraductal filling defect.  She is now referred for surgical duct excision. Past Medical History  Diagnosis Date  . Fibromyalgia   . Depression   . PTSD (post-traumatic stress disorder)   . Chronic back pain   . Neuropathy     left foot  . Hypertension   . Migraine headache   . Tachycardia   . Gastric ulcer     Past Surgical History  Procedure Laterality Date  . Back surgery    . Cholecystectomy    . Abdominal hysterectomy    . Dilation and curettage of uterus    . Breast surgery      both breast/breast reduction  . Cesarean section      2 previous  . Back surgery  2007, 2010    Family History  Problem Relation Age of Onset  . Hypertension Mother   . Hypertension Father   . Hypertension Maternal Grandmother   . Hypertension Maternal Grandfather   . Hypertension Paternal Grandmother   . Hypertension Paternal Grandfather     Social History History  Substance Use Topics  . Smoking status: Never Smoker   . Smokeless tobacco: Not on file  . Alcohol Use: Yes     Comment: rarely    Allergies  Allergen Reactions  . Other Anaphylaxis    Bee Stings  . Sulfa Antibiotics Swelling    Tongue swelling  . Sulfamethoxazole Anaphylaxis  . Bee  Venom   . Morphine And Related Other (See Comments)    Causes headaches  . Latex Rash  . Morphine Rash    Severe headache    Current Outpatient Prescriptions  Medication Sig Dispense Refill  . butalbital-acetaminophen-caffeine (FIORICET, ESGIC) 50-325-40 MG per tablet Take 1 tablet by mouth 2 (two) times daily as needed. For headaches      . cyclobenzaprine (FLEXERIL) 10 MG tablet Take 10 mg by mouth 3 (three) times daily as needed. Muscle spasms.      . DULoxetine (CYMBALTA) 60 MG capsule Take 60 mg by mouth 2 (two) times daily.      Marland Kitchen gabapentin (NEURONTIN) 800 MG tablet Take 800 mg by mouth 3 (three) times daily.       . pantoprazole (PROTONIX) 40 MG tablet Take 40 mg by mouth daily.      . promethazine (PHENERGAN) 25 MG tablet 25 mg. Take 25 mg by mouth every 6 (six) hours as needed.      . propranolol (INDERAL) 80 MG tablet Take 80 mg by mouth daily.      . ranitidine (ZANTAC) 150 MG tablet Take 1 tablet (150 mg total) by mouth 2 (two) times daily.  60 tablet  0   No current facility-administered  medications for this visit.    Review of Systems Review of Systems  Constitutional: Negative for fever, chills and unexpected weight change.  HENT: Negative for hearing loss, congestion, sore throat, trouble swallowing and voice change.   Eyes: Negative for visual disturbance.  Respiratory: Negative for cough and wheezing.   Cardiovascular: Negative for chest pain, palpitations and leg swelling.  Gastrointestinal: Negative for nausea, vomiting, abdominal pain, diarrhea, constipation, blood in stool, abdominal distention and anal bleeding.  Genitourinary: Negative for hematuria, vaginal bleeding and difficulty urinating.  Musculoskeletal: Positive for back pain. Negative for arthralgias.  Skin: Negative for rash and wound.  Neurological: Negative for seizures, syncope and headaches.  Hematological: Negative for adenopathy. Does not bruise/bleed easily.  Psychiatric/Behavioral: Negative  for confusion.   Menarche age 89 First pregnancy age 21 Breastfeed - no Hormones - no LMP - age 61 at time of hysterectomy   Blood pressure 120/78, pulse 72, temperature 98 F (36.7 C), temperature source Temporal, resp. rate 17, height 5\' 4"  (1.626 m), weight 187 lb 9.6 oz (85.095 kg), SpO2 99.00%.  Physical Exam Physical Exam WDWN in NAD HEENT:  EOMI, sclera anicteric Neck:  No masses, no thyromegaly Breasts - symmetric; healed scars from reduction surgery.  No palpable masses in right breast.  Left breast shows a 1 cm palpable mass at 12:00 in the retroareolar region.  I am unable to express any discharge from the nipple today. Lungs:  CTA bilaterally; normal respiratory effort CV:  Regular rate and rhythm; no murmurs Abd:  +bowel sounds, soft, non-tender, no masses Ext:  Well-perfused; no edema Skin:  Warm, dry; no sign of jaundice  Data Reviewed *RADIOLOGY REPORT*  Clinical Data: Spontaneous bloody left nipple discharge and a  palpable abnormality in the subareolar region.  DIGITAL DIAGNOSTIC BILATERAL MAMMOGRAM WITH CAD AND LEFT BREAST  ULTRASOUND:  Comparison: With priors  Findings:  ACR Breast Density Category 3: The breast tissue is heterogeneously  dense.  Changes of reduction mammoplasty are identified. There is no new  suspicious mass, malignant-type microcalcifications or distortion.  Mammographic images were processed with CAD.  On physical exam, I do not palpate a mass in the left breast.  Blood tinged nipple discharge could be expressed from a single duct  in the lower/central aspect of the left nipple.  Ultrasound is performed, showing in the left breast at 12 o'clock  in a subareolar region there is a moderately dilated duct. It  appears to have an 8 x 7 mm intraductal filling defect.  An attempt at a ductogram was performed but unsuccessful (this will  be dictated separately).  IMPRESSION:  Suspicious left nipple discharge and the sonographic findings  are  concerning for an intraductal mass.  RECOMMENDATION:  Surgical consultation is recommended. This is been scheduled with  Dr. Corliss Skains on 07/07/2012.  I have discussed the findings and recommendations with the patient.  Results were also provided in writing at the conclusion of the  visit. If applicable, a reminder letter will be sent to the  patient regarding the next appointment.  BI-RADS CATEGORY 4: Suspicious abnormality - biopsy should be  considered.  Original Report Authenticated By: Baird Lyons, M.D.    Assessment    Left bloody nipple discharge with intraductal mass    Plan    Left nipple duct excision.  The surgical procedure has been discussed with the patient.  Potential risks, benefits, alternative treatments, and expected outcomes have been explained.  All of the patient's questions at this time have  been answered.  The likelihood of reaching the patient's treatment goal is good.  The patient understand the proposed surgical procedure and wishes to proceed.         , K. 07/07/2012, 1:15 PM

## 2012-07-26 ENCOUNTER — Encounter (HOSPITAL_COMMUNITY): Payer: Self-pay

## 2012-07-26 ENCOUNTER — Ambulatory Visit (HOSPITAL_COMMUNITY)
Admission: RE | Admit: 2012-07-26 | Discharge: 2012-07-26 | Disposition: A | Discharge status: Home / Self Care | Payer: Medicaid Other | Source: Ambulatory Visit | Attending: Anesthesiology | Admitting: Anesthesiology

## 2012-07-26 ENCOUNTER — Encounter (HOSPITAL_COMMUNITY): Payer: Self-pay | Admitting: Pharmacy Technician

## 2012-07-26 ENCOUNTER — Encounter (HOSPITAL_COMMUNITY)
Admission: RE | Admit: 2012-07-26 | Discharge: 2012-07-26 | Disposition: A | Discharge status: Home / Self Care | Payer: Medicaid Other | Source: Ambulatory Visit | Attending: Surgery | Admitting: Surgery

## 2012-07-26 DIAGNOSIS — Z01818 Encounter for other preprocedural examination: Secondary | ICD-10-CM | POA: Insufficient documentation

## 2012-07-26 DIAGNOSIS — Z01812 Encounter for preprocedural laboratory examination: Secondary | ICD-10-CM | POA: Insufficient documentation

## 2012-07-26 DIAGNOSIS — Z01810 Encounter for preprocedural cardiovascular examination: Secondary | ICD-10-CM | POA: Insufficient documentation

## 2012-07-26 HISTORY — DX: Cardiac arrhythmia, unspecified: I49.9

## 2012-07-26 HISTORY — DX: Gastro-esophageal reflux disease without esophagitis: K21.9

## 2012-07-26 LAB — CBC
MCHC: 32.6 g/dL (ref 30.0–36.0)
MCV: 89 fL (ref 78.0–100.0)
Platelets: 294 10*3/uL (ref 150–400)
RDW: 12.2 % (ref 11.5–15.5)
WBC: 7.7 10*3/uL (ref 4.0–10.5)

## 2012-07-26 LAB — BASIC METABOLIC PANEL
BUN: 9 mg/dL (ref 6–23)
CO2: 23 mEq/L (ref 19–32)
Calcium: 9.1 mg/dL (ref 8.4–10.5)
Creatinine, Ser: 0.76 mg/dL (ref 0.50–1.10)

## 2012-07-26 NOTE — Pre-Procedure Instructions (Addendum)
Ashley [name]  07/26/2012   Your procedure is scheduled on: 08/03/12  Report to [facility] at 1000 AM.  Call this number if you have problems the morning of surgery: [phone]   Remember:   Do not eat food or drink liquids after midnight.   Take these medicines the morning of surgery with A SIP OF WATER: cymbalta, gabapentin, protonix, propanerolol,             STOP fioricet, 07/29/12   Do not wear jewelry, make-up or nail polish.  Do not wear lotions, powders, or perfumes. You may wear deodorant.  Do not shave 48 hours prior to surgery. Men may shave face and neck.  Do not bring valuables to the hospital.  [facility] is not responsible                   for any belongings or valuables.  Contacts, dentures or bridgework may not be worn into surgery.  Leave suitcase in the car. After surgery it may be brought to your room.  For patients admitted to the hospital, checkout time is 11:00 AM the day of  discharge.   Patients discharged the day of surgery will not be allowed to drive  home.  Name and phone number of your driver:   Special Instructions: Shower using CHG 2 nights before surgery and the night before surgery.  If you shower the day of surgery use CHG.  Use special wash - you have one bottle of CHG for all showers.  You should use approximately 1/3 of the bottle for each shower.   Please read over the following fact sheets that you were given: Pain Booklet, Coughing and Deep Breathing and Surgical Site Infection Prevention

## 2012-08-02 MED ORDER — CEFAZOLIN SODIUM-DEXTROSE 2-3 GM-% IV SOLR
2.0000 g | INTRAVENOUS | Status: AC
  Administered 2012-08-03: 2 g via INTRAVENOUS
  Filled 2012-08-02: qty 50

## 2012-08-03 ENCOUNTER — Encounter (HOSPITAL_COMMUNITY)
Admission: RE | Disposition: A | Discharge status: Home / Self Care | Payer: Self-pay | Source: Ambulatory Visit | Attending: Surgery

## 2012-08-03 ENCOUNTER — Ambulatory Visit (HOSPITAL_COMMUNITY): Payer: Medicaid Other | Admitting: Anesthesiology

## 2012-08-03 ENCOUNTER — Encounter (HOSPITAL_COMMUNITY): Payer: Self-pay | Admitting: Anesthesiology

## 2012-08-03 ENCOUNTER — Ambulatory Visit (HOSPITAL_COMMUNITY)
Admission: RE | Admit: 2012-08-03 | Discharge: 2012-08-03 | Disposition: A | Discharge status: Home / Self Care | Payer: Medicaid Other | Source: Ambulatory Visit | Attending: Surgery | Admitting: Surgery

## 2012-08-03 ENCOUNTER — Encounter (HOSPITAL_COMMUNITY): Payer: Self-pay

## 2012-08-03 DIAGNOSIS — Z885 Allergy status to narcotic agent status: Secondary | ICD-10-CM | POA: Insufficient documentation

## 2012-08-03 DIAGNOSIS — G43909 Migraine, unspecified, not intractable, without status migrainosus: Secondary | ICD-10-CM | POA: Insufficient documentation

## 2012-08-03 DIAGNOSIS — I10 Essential (primary) hypertension: Secondary | ICD-10-CM | POA: Insufficient documentation

## 2012-08-03 DIAGNOSIS — N6089 Other benign mammary dysplasias of unspecified breast: Secondary | ICD-10-CM | POA: Insufficient documentation

## 2012-08-03 DIAGNOSIS — G609 Hereditary and idiopathic neuropathy, unspecified: Secondary | ICD-10-CM | POA: Insufficient documentation

## 2012-08-03 DIAGNOSIS — Z91038 Other insect allergy status: Secondary | ICD-10-CM | POA: Insufficient documentation

## 2012-08-03 DIAGNOSIS — R000 Tachycardia, unspecified: Secondary | ICD-10-CM | POA: Insufficient documentation

## 2012-08-03 DIAGNOSIS — Z79899 Other long term (current) drug therapy: Secondary | ICD-10-CM | POA: Insufficient documentation

## 2012-08-03 DIAGNOSIS — IMO0001 Reserved for inherently not codable concepts without codable children: Secondary | ICD-10-CM | POA: Insufficient documentation

## 2012-08-03 DIAGNOSIS — Z91040 Latex allergy status: Secondary | ICD-10-CM | POA: Insufficient documentation

## 2012-08-03 DIAGNOSIS — N6452 Nipple discharge: Secondary | ICD-10-CM

## 2012-08-03 DIAGNOSIS — F4310 Post-traumatic stress disorder, unspecified: Secondary | ICD-10-CM | POA: Insufficient documentation

## 2012-08-03 DIAGNOSIS — N6459 Other signs and symptoms in breast: Secondary | ICD-10-CM

## 2012-08-03 DIAGNOSIS — Z882 Allergy status to sulfonamides status: Secondary | ICD-10-CM | POA: Insufficient documentation

## 2012-08-03 HISTORY — PX: BREAST DUCTAL SYSTEM EXCISION: SHX5242

## 2012-08-03 SURGERY — EXCISION DUCTAL SYSTEM BREAST
Anesthesia: General | Site: Breast | Laterality: Left | Wound class: Clean

## 2012-08-03 MED ORDER — LACTATED RINGERS IV SOLN
INTRAVENOUS | Status: DC | PRN
  Administered 2012-08-03 (×2): via INTRAVENOUS

## 2012-08-03 MED ORDER — OXYCODONE HCL 5 MG/5ML PO SOLN
5.0000 mg | Freq: Once | ORAL | Status: AC | PRN
Start: 2012-08-03 — End: 2012-08-03

## 2012-08-03 MED ORDER — BUPIVACAINE HCL (PF) 0.25 % IJ SOLN
INTRAMUSCULAR | Status: DC | PRN
  Administered 2012-08-03: 30 mL

## 2012-08-03 MED ORDER — HYDROMORPHONE HCL PF 1 MG/ML IJ SOLN
INTRAMUSCULAR | Status: AC
  Filled 2012-08-03: qty 1

## 2012-08-03 MED ORDER — KETOROLAC TROMETHAMINE 30 MG/ML IJ SOLN
INTRAMUSCULAR | Status: DC | PRN
  Administered 2012-08-03: 30 mg via INTRAVENOUS

## 2012-08-03 MED ORDER — GLYCOPYRROLATE 0.2 MG/ML IJ SOLN
INTRAMUSCULAR | Status: DC | PRN
  Administered 2012-08-03: 0.2 mg via INTRAVENOUS

## 2012-08-03 MED ORDER — HYDROMORPHONE HCL PF 1 MG/ML IJ SOLN: 1.0000 mg | INTRAMUSCULAR | Status: DC | PRN

## 2012-08-03 MED ORDER — PROPOFOL 10 MG/ML IV BOLUS
INTRAVENOUS | Status: DC | PRN
  Administered 2012-08-03: 180 mg via INTRAVENOUS

## 2012-08-03 MED ORDER — OXYCODONE HCL 5 MG PO TABS
5.0000 mg | ORAL_TABLET | Freq: Once | ORAL | Status: AC | PRN
  Administered 2012-08-03: 5 mg via ORAL

## 2012-08-03 MED ORDER — ONDANSETRON HCL 4 MG/2ML IJ SOLN: 4.0000 mg | INTRAMUSCULAR | Status: DC | PRN

## 2012-08-03 MED ORDER — LIDOCAINE HCL (CARDIAC) 20 MG/ML IV SOLN
INTRAVENOUS | Status: DC | PRN
  Administered 2012-08-03: 80 mg via INTRAVENOUS

## 2012-08-03 MED ORDER — EPHEDRINE SULFATE 50 MG/ML IJ SOLN
INTRAMUSCULAR | Status: DC | PRN
  Administered 2012-08-03 (×3): 10 mg via INTRAVENOUS

## 2012-08-03 MED ORDER — ATROPINE SULFATE 1 MG/ML IJ SOLN
INTRAMUSCULAR | Status: DC | PRN
  Administered 2012-08-03: 0.4 mg via INTRAVENOUS

## 2012-08-03 MED ORDER — ONDANSETRON HCL 4 MG/2ML IJ SOLN
INTRAMUSCULAR | Status: DC | PRN
  Administered 2012-08-03: 4 mg via INTRAVENOUS

## 2012-08-03 MED ORDER — HYDROMORPHONE HCL PF 1 MG/ML IJ SOLN
0.2500 mg | INTRAMUSCULAR | Status: DC | PRN
  Administered 2012-08-03 (×4): 0.5 mg via INTRAVENOUS

## 2012-08-03 MED ORDER — PROMETHAZINE HCL 25 MG/ML IJ SOLN
INTRAMUSCULAR | Status: AC
  Filled 2012-08-03: qty 1

## 2012-08-03 MED ORDER — ONDANSETRON HCL 4 MG/2ML IJ SOLN
INTRAMUSCULAR | Status: AC
  Filled 2012-08-03: qty 2

## 2012-08-03 MED ORDER — PROMETHAZINE HCL 25 MG/ML IJ SOLN
6.2500 mg | INTRAMUSCULAR | Status: DC | PRN
Start: 2012-08-03 — End: 2012-08-03
  Administered 2012-08-03: 6.25 mg via INTRAVENOUS

## 2012-08-03 MED ORDER — PHENYLEPHRINE HCL 10 MG/ML IJ SOLN
INTRAMUSCULAR | Status: DC | PRN
  Administered 2012-08-03: 40 ug via INTRAVENOUS
  Administered 2012-08-03 (×2): 80 ug via INTRAVENOUS

## 2012-08-03 MED ORDER — FENTANYL CITRATE 0.05 MG/ML IJ SOLN
INTRAMUSCULAR | Status: DC | PRN
  Administered 2012-08-03: 100 ug via INTRAVENOUS

## 2012-08-03 MED ORDER — BUPIVACAINE HCL (PF) 0.25 % IJ SOLN
INTRAMUSCULAR | Status: AC
  Filled 2012-08-03: qty 30

## 2012-08-03 MED ORDER — MIDAZOLAM HCL 5 MG/5ML IJ SOLN
INTRAMUSCULAR | Status: DC | PRN
  Administered 2012-08-03: 2 mg via INTRAVENOUS

## 2012-08-03 MED ORDER — OXYCODONE HCL 5 MG PO TABS
ORAL_TABLET | ORAL | Status: AC
  Filled 2012-08-03: qty 1

## 2012-08-03 MED ORDER — HYDROCODONE-ACETAMINOPHEN 5-325 MG PO TABS: 1.0000 | ORAL_TABLET | ORAL | Status: DC | PRN

## 2012-08-03 SURGICAL SUPPLY — 46 items
APL SKNCLS STERI-STRIP NONHPOA (GAUZE/BANDAGES/DRESSINGS) ×1
BENZOIN TINCTURE PRP APPL 2/3 (GAUZE/BANDAGES/DRESSINGS) ×2 IMPLANT
BLADE SURG 15 STRL LF DISP TIS (BLADE) ×1 IMPLANT
BLADE SURG 15 STRL SS (BLADE) ×2
CANISTER SUCTION 2500CC (MISCELLANEOUS) ×2 IMPLANT
CHLORAPREP W/TINT 26ML (MISCELLANEOUS) ×2 IMPLANT
CLOTH BEACON ORANGE TIMEOUT ST (SAFETY) ×2 IMPLANT
CONT SPEC 4OZ CLIKSEAL STRL BL (MISCELLANEOUS) ×2 IMPLANT
COVER SURGICAL LIGHT HANDLE (MISCELLANEOUS) ×2 IMPLANT
DRAPE PED LAPAROTOMY (DRAPES) ×2 IMPLANT
DRAPE UTILITY 15X26 W/TAPE STR (DRAPE) ×4 IMPLANT
DRSG TEGADERM 4X4.75 (GAUZE/BANDAGES/DRESSINGS) ×2 IMPLANT
ELECT CAUTERY BLADE 6.4 (BLADE) ×2 IMPLANT
ELECT REM PT RETURN 9FT ADLT (ELECTROSURGICAL) ×2
ELECTRODE REM PT RTRN 9FT ADLT (ELECTROSURGICAL) ×1 IMPLANT
GAUZE SPONGE 2X2 8PLY STRL LF (GAUZE/BANDAGES/DRESSINGS) ×1 IMPLANT
GLOVE BIO SURGEON STRL SZ7 (GLOVE) IMPLANT
GLOVE BIOGEL PI IND STRL 6.5 (GLOVE) ×1 IMPLANT
GLOVE BIOGEL PI IND STRL 7.0 (GLOVE) ×2 IMPLANT
GLOVE BIOGEL PI IND STRL 7.5 (GLOVE) ×1 IMPLANT
GLOVE BIOGEL PI INDICATOR 6.5 (GLOVE) ×1
GLOVE BIOGEL PI INDICATOR 7.0 (GLOVE) ×2
GLOVE BIOGEL PI INDICATOR 7.5 (GLOVE) ×1
GLOVE SURG SS PI 7.0 STRL IVOR (GLOVE) ×4 IMPLANT
GOWN STRL NON-REIN LRG LVL3 (GOWN DISPOSABLE) ×8 IMPLANT
KIT BASIN OR (CUSTOM PROCEDURE TRAY) ×2 IMPLANT
KIT MARKER MARGIN INK (KITS) ×2 IMPLANT
KIT ROOM TURNOVER OR (KITS) ×2 IMPLANT
NEEDLE HYPO 25GX1X1/2 BEV (NEEDLE) ×2 IMPLANT
NS IRRIG 1000ML POUR BTL (IV SOLUTION) ×2 IMPLANT
PACK SURGICAL SETUP 50X90 (CUSTOM PROCEDURE TRAY) ×2 IMPLANT
PAD ARMBOARD 7.5X6 YLW CONV (MISCELLANEOUS) ×4 IMPLANT
PENCIL BUTTON HOLSTER BLD 10FT (ELECTRODE) ×2 IMPLANT
SPONGE GAUZE 2X2 STER 10/PKG (GAUZE/BANDAGES/DRESSINGS) ×1
SPONGE LAP 4X18 X RAY DECT (DISPOSABLE) ×2 IMPLANT
STRIP CLOSURE SKIN 1/2X4 (GAUZE/BANDAGES/DRESSINGS) ×2 IMPLANT
SUT MON AB 4-0 PC3 18 (SUTURE) ×2 IMPLANT
SUT VIC AB 3-0 SH 27 (SUTURE) ×2
SUT VIC AB 3-0 SH 27X BRD (SUTURE) ×1 IMPLANT
SYR BULB 3OZ (MISCELLANEOUS) ×2 IMPLANT
SYR CONTROL 10ML LL (SYRINGE) ×2 IMPLANT
TOWEL OR 17X24 6PK STRL BLUE (TOWEL DISPOSABLE) ×2 IMPLANT
TOWEL OR 17X26 10 PK STRL BLUE (TOWEL DISPOSABLE) ×2 IMPLANT
TUBE CONNECTING 12X1/4 (SUCTIONS) ×2 IMPLANT
WATER STERILE IRR 1000ML POUR (IV SOLUTION) IMPLANT
YANKAUER SUCT BULB TIP NO VENT (SUCTIONS) ×2 IMPLANT

## 2012-08-03 NOTE — H&P (View-Only) (Signed)
Patient ID: Ashley [name], female   DOB: Tyrhonda 04, 1967, 47 y.o.   MRN: [id number]  Chief Complaint  Patient presents with  . New Evaluation    eval l intraductal breast mass    HPI Ashley [name] is a 47 y.o. female.  Referred by Dr. Judyann Munson for evaluation of bloody nipple discharge HPI This is a 47 year old female with chronic back pain who presents with about 10 months of clear nipple discharge. However last month this nipple discharge on the left showed some bloody drainage. She was evaluated with a diagnostic mammogram on 06/28/12.  Bloody discharge was noted coming from a single duct in the lower central aspect of the left nipple. An ultrasound showed a moderately dilated duct in the subareolar region at 12:00. This contains an 8 mm intraductal filling defect.  She is now referred for surgical duct excision. Past Medical History  Diagnosis Date  . Fibromyalgia   . Depression   . PTSD (post-traumatic stress disorder)   . Chronic back pain   . Neuropathy     left foot  . Hypertension   . Migraine headache   . Tachycardia   . Gastric ulcer     Past Surgical History  Procedure Laterality Date  . Back surgery    . Cholecystectomy    . Abdominal hysterectomy    . Dilation and curettage of uterus    . Breast surgery      both breast/breast reduction  . Cesarean section      2 previous  . Back surgery  2007, 2010    Family History  Problem Relation Age of Onset  . Hypertension Mother   . Hypertension Father   . Hypertension Maternal Grandmother   . Hypertension Maternal Grandfather   . Hypertension Paternal Grandmother   . Hypertension Paternal Grandfather     Social History History  Substance Use Topics  . Smoking status: Never Smoker   . Smokeless tobacco: Not on file  . Alcohol Use: Yes     Comment: rarely    Allergies  Allergen Reactions  . Other Anaphylaxis    Bee Stings  . Sulfa Antibiotics Swelling    Tongue swelling  . Sulfamethoxazole Anaphylaxis  . Bee  Venom   . Morphine And Related Other (See Comments)    Causes headaches  . Latex Rash  . Morphine Rash    Severe headache    Current Outpatient Prescriptions  Medication Sig Dispense Refill  . butalbital-acetaminophen-caffeine (FIORICET, ESGIC) 50-325-40 MG per tablet Take 1 tablet by mouth 2 (two) times daily as needed. For headaches      . cyclobenzaprine (FLEXERIL) 10 MG tablet Take 10 mg by mouth 3 (three) times daily as needed. Muscle spasms.      . DULoxetine (CYMBALTA) 60 MG capsule Take 60 mg by mouth 2 (two) times daily.      Marland Kitchen gabapentin (NEURONTIN) 800 MG tablet Take 800 mg by mouth 3 (three) times daily.       . pantoprazole (PROTONIX) 40 MG tablet Take 40 mg by mouth daily.      . promethazine (PHENERGAN) 25 MG tablet 25 mg. Take 25 mg by mouth every 6 (six) hours as needed.      . propranolol (INDERAL) 80 MG tablet Take 80 mg by mouth daily.      . ranitidine (ZANTAC) 150 MG tablet Take 1 tablet (150 mg total) by mouth 2 (two) times daily.  60 tablet  0   No current facility-administered  medications for this visit.    Review of Systems Review of Systems  Constitutional: Negative for fever, chills and unexpected weight change.  HENT: Negative for hearing loss, congestion, sore throat, trouble swallowing and voice change.   Eyes: Negative for visual disturbance.  Respiratory: Negative for cough and wheezing.   Cardiovascular: Negative for chest pain, palpitations and leg swelling.  Gastrointestinal: Negative for nausea, vomiting, abdominal pain, diarrhea, constipation, blood in stool, abdominal distention and anal bleeding.  Genitourinary: Negative for hematuria, vaginal bleeding and difficulty urinating.  Musculoskeletal: Positive for back pain. Negative for arthralgias.  Skin: Negative for rash and wound.  Neurological: Negative for seizures, syncope and headaches.  Hematological: Negative for adenopathy. Does not bruise/bleed easily.  Psychiatric/Behavioral: Negative  for confusion.   Menarche age 69 First pregnancy age 47 Breastfeed - no Hormones - no LMP - age 66 at time of hysterectomy   Blood pressure 120/78, pulse 72, temperature 98 F (36.7 C), temperature source Temporal, resp. rate 17, height 5\' 4"  (1.626 m), weight 187 lb 9.6 oz (85.095 kg), SpO2 99.00%.  Physical Exam Physical Exam WDWN in NAD HEENT:  EOMI, sclera anicteric Neck:  No masses, no thyromegaly Breasts - symmetric; healed scars from reduction surgery.  No palpable masses in right breast.  Left breast shows a 1 cm palpable mass at 12:00 in the retroareolar region.  I am unable to express any discharge from the nipple today. Lungs:  CTA bilaterally; normal respiratory effort CV:  Regular rate and rhythm; no murmurs Abd:  +bowel sounds, soft, non-tender, no masses Ext:  Well-perfused; no edema Skin:  Warm, dry; no sign of jaundice  Data Reviewed *RADIOLOGY REPORT*  Clinical Data: Spontaneous bloody left nipple discharge and a  palpable abnormality in the subareolar region.  DIGITAL DIAGNOSTIC BILATERAL MAMMOGRAM WITH CAD AND LEFT BREAST  ULTRASOUND:  Comparison: With priors  Findings:  ACR Breast Density Category 3: The breast tissue is heterogeneously  dense.  Changes of reduction mammoplasty are identified. There is no new  suspicious mass, malignant-type microcalcifications or distortion.  Mammographic images were processed with CAD.  On physical exam, I do not palpate a mass in the left breast.  Blood tinged nipple discharge could be expressed from a single duct  in the lower/central aspect of the left nipple.  Ultrasound is performed, showing in the left breast at 12 o'clock  in a subareolar region there is a moderately dilated duct. It  appears to have an 8 x 7 mm intraductal filling defect.  An attempt at a ductogram was performed but unsuccessful (this will  be dictated separately).  IMPRESSION:  Suspicious left nipple discharge and the sonographic findings  are  concerning for an intraductal mass.  RECOMMENDATION:  Surgical consultation is recommended. This is been scheduled with  Dr. Corliss Skains on 07/07/2012.  I have discussed the findings and recommendations with the patient.  Results were also provided in writing at the conclusion of the  visit. If applicable, a reminder letter will be sent to the  patient regarding the next appointment.  BI-RADS CATEGORY 4: Suspicious abnormality - biopsy should be  considered.  Original Report Authenticated By: Baird Lyons, M.D.    Assessment    Left bloody nipple discharge with intraductal mass    Plan    Left nipple duct excision.  The surgical procedure has been discussed with the patient.  Potential risks, benefits, alternative treatments, and expected outcomes have been explained.  All of the patient's questions at this time have  been answered.  The likelihood of reaching the patient's treatment goal is good.  The patient understand the proposed surgical procedure and wishes to proceed.         , K. 07/07/2012, 1:15 PM

## 2012-08-03 NOTE — Interval H&P Note (Signed)
History and Physical Interval Note:  08/03/2012 10:41 AM  Ashley [name]  has presented today for surgery, with the diagnosis of left bloody nipple discharge  The various methods of treatment have been discussed with the patient and family. After consideration of risks, benefits and other options for treatment, the patient has consented to  Procedure(s): Left nipple duct excision (Left) as a surgical intervention .  The patient's history has been reviewed, patient examined, no change in status, stable for surgery.  I have reviewed the patient's chart and labs.  Questions were answered to the patient's satisfaction.     , K.

## 2012-08-03 NOTE — Anesthesia Preprocedure Evaluation (Addendum)
Anesthesia Evaluation  Patient identified by MRN, date of birth, ID band  Reviewed: Allergy & Precautions, H&P , NPO status , Patient's Chart, lab work & pertinent test results, reviewed documented beta blocker date and time   History of Anesthesia Complications Negative for: history of anesthetic complications  Airway Mallampati: III TM Distance: >3 FB Neck ROM: Full    Dental no notable dental hx. (+) Dental Advisory Given   Pulmonary neg pulmonary ROS,    Pulmonary exam normal       Cardiovascular hypertension, Pt. on home beta blockers + dysrhythmias Supra Ventricular Tachycardia     Neuro/Psych  Headaches, PSYCHIATRIC DISORDERS Depression    GI/Hepatic PUD, GERD-  Medicated,(+)     substance abuse  marijuana use,   Endo/Other  negative endocrine ROS  Renal/GU negative Renal ROS     Musculoskeletal  (+) Fibromyalgia -  Abdominal Normal abdominal exam  (+)   Peds  Hematology   Anesthesia Other Findings   Reproductive/Obstetrics                         Anesthesia Physical Anesthesia Plan  ASA: II  Anesthesia Plan: General   Post-op Pain Management:    Induction: Intravenous  Airway Management Planned: LMA  Additional Equipment:   Intra-op Plan:   Post-operative Plan: Extubation in OR  Informed Consent:   Plan Discussed with: CRNA, Anesthesiologist and Surgeon  Anesthesia Plan Comments:         Anesthesia Quick Evaluation

## 2012-08-03 NOTE — Op Note (Signed)
Preop diagnosis: Left bloody nipple discharge Postop diagnosis: Same Procedure performed: Left nipple duct excision Surgeon:, K. Anesthesia: Gen. Endotracheal Indications: This is a 47 year old female who presents with several weeks of left bloody nipple discharge which has remained persistent.  Ultrasound showed a moderately dilated duct in the subareolar region at 12:00 containing a small intraductal filling defect. She presents now for duct excision. The patient has had a previous breast reduction and her left nipple has been removed. Her nipple discharge seems to come from the lower part of her nipple but the dilated duct is in the superior portion of the nipple.  Description of procedure: The patient brought to the operating room placed in supine position on the operating room table. After adequate level of general anesthesia was obtained the patient's left breast was prepped with chlor prep and draped in sterile fashion.  A timeout was taken to ensure the proper patient proper procedure.I was able to express some yellowish drainage from the inferior portion of her nipple. We attempted to cannulate this with a lacrimal duct probe. However we were unsuccessful in cannulating the duct. We made a curvilinear incision in the lateral aspect of her nipple. We dissected down into the breast tissue with cautery. We dissected on the posterior surface of the nipple until we encountered a couple of enlarged ducts. One of these was directed more inferiorly in the other was directed more superiorly. We amputated these off of the posterior surface of the nipple and grasp this with an Allis clamp. We then took a cone of tissue including both ducts. We went down for a depth of about 4 cm. The specimen was excised and oriented with a paint kit. We inspected for hemostasis. The wound was irrigated and closed with a deep layer 3-0 Vicryl. 4-0 Monocryl was used to close the skin. Steri-Strips and clean dressings  were applied. The patient was then extubated and brought to the recovery room in stable condition. All sponge, instrument, and needle counts are correct.  Wilmon Arms. Corliss Skains, MD, [facility]  General/ Trauma Surgery  08/03/2012 12:22 PM

## 2012-08-03 NOTE — Progress Notes (Addendum)
Patient requested dilaudid po for discharge pain medication - told her prescriptions were written for vicodin encouraged she could call physician's office   Patient wanted mediation for itching but had no raised areas no reddened areas and had not scratched any. No medications given

## 2012-08-03 NOTE — Preoperative (Signed)
Beta Blockers   Reason not to administer Beta Blockers:Propanolol 08/03/12

## 2012-08-03 NOTE — Progress Notes (Signed)
Transported to [facility] via w/c by evette nt

## 2012-08-03 NOTE — Transfer of Care (Signed)
Immediate Anesthesia Transfer of Care Note  Patient: Ashley [name]  Procedure(s) Performed: Procedure(s): LEFT NIPPLE DUCT EXCISION (Left)  Patient Location: PACU  Anesthesia Type:General  Level of Consciousness: awake, alert , oriented and patient cooperative  Airway & Oxygen Therapy: Patient Spontanous Breathing and Patient connected to nasal cannula oxygen  Post-op Assessment: Report given to PACU RN and Post -op Vital signs reviewed and stable  Post vital signs: Reviewed and stable  Complications: No apparent anesthesia complications

## 2012-08-03 NOTE — Anesthesia Postprocedure Evaluation (Signed)
Anesthesia Post Note  Patient: Ashley [name]  Procedure(s) Performed: Procedure(s) (LRB): LEFT NIPPLE DUCT EXCISION (Left)  Anesthesia type: general  Patient location: PACU  Post pain: Pain level controlled  Post assessment: Patient's Cardiovascular Status Stable  Last Vitals:  Filed Vitals:   08/03/12 1223  BP:   Pulse:   Temp: 36.4 C  Resp:     Post vital signs: Reviewed and stable  Level of consciousness: sedated  Complications: No apparent anesthesia complications

## 2012-08-04 ENCOUNTER — Telehealth (INDEPENDENT_AMBULATORY_CARE_PROVIDER_SITE_OTHER): Payer: Self-pay | Admitting: General Surgery

## 2012-08-04 NOTE — Telephone Encounter (Signed)
Message copied by Wilder Glade on Wed Aug 04, 2012  4:03 PM ------      Message from: Manus Rudd K      Created: Wed Aug 04, 2012  3:56 PM       Please call her and let her know that her pathology showed no sign of cancer. ------

## 2012-08-04 NOTE — Telephone Encounter (Signed)
Called patient to let her know that her path report was normal and no sign of cancer

## 2012-08-05 ENCOUNTER — Encounter (HOSPITAL_COMMUNITY): Payer: Self-pay | Admitting: Surgery

## 2012-08-05 NOTE — Care Management Note (Signed)
    Page 1 of 1   08/03/2012     3:06:26 PM   CARE MANAGEMENT NOTE 08/03/2012  Patient:  SHERALD, BALBUENA   Account Number:  [id number]  Date Initiated:  08/03/2012  Documentation initiated by:  ,  Subjective/Objective Assessment:     Action/Plan:   Anticipated DC Date:  08/03/2012   Anticipated DC Plan:        DC Planning Services  MATCH Program      Choice offered to / List presented to:             Status of service:   Medicare Important Message given?   (If response is "NO", the following Medicare IM given date fields will be blank) Date Medicare IM given:   Date Additional Medicare IM given:    Discharge Disposition:    Per UR Regulation:    If discussed at Long Length of Stay Meetings, dates discussed:    Comments:  Medication assistance provided using the [facility] program. Patient understands this is a once per calandar year assistance and she is responsible for a $3 co-pay.  Lourdes Sledge RN [phone]

## 2012-08-17 ENCOUNTER — Ambulatory Visit (INDEPENDENT_AMBULATORY_CARE_PROVIDER_SITE_OTHER): Payer: PRIVATE HEALTH INSURANCE | Admitting: Surgery

## 2012-08-17 ENCOUNTER — Encounter (INDEPENDENT_AMBULATORY_CARE_PROVIDER_SITE_OTHER): Payer: Self-pay | Admitting: Surgery

## 2012-08-17 VITALS — BP 122/81 | HR 66 | Temp 98.6°F | Resp 12 | Ht 64.0 in | Wt 193.8 lb

## 2012-08-17 DIAGNOSIS — N6459 Other signs and symptoms in breast: Secondary | ICD-10-CM

## 2012-08-17 DIAGNOSIS — N6452 Nipple discharge: Secondary | ICD-10-CM

## 2012-08-17 NOTE — Progress Notes (Signed)
Status post left nipple duct excision on 08/03/12. Pathology showed atypical lobular hyperplasia with no sign of malignancy. Her incision is healing well. She does have some  Superficial sloughing of some of the epidermis near her incision. However the sloughing does not seem to be full thickness. I encouraged her to keep this well moisturized and this should heal on its own. She should keep up with her annual mammograms. Followup for as needed  Wilmon Arms. Corliss Skains, MD, [facility] Surgery  General/ Trauma Surgery  08/17/2012 10:40 AM

## 2012-09-09 DIAGNOSIS — R079 Chest pain, unspecified: Secondary | ICD-10-CM

## 2012-09-10 DIAGNOSIS — F4310 Post-traumatic stress disorder, unspecified: Secondary | ICD-10-CM | POA: Diagnosis not present

## 2012-10-06 ENCOUNTER — Emergency Department (HOSPITAL_COMMUNITY): Payer: Medicaid Other

## 2012-10-06 ENCOUNTER — Emergency Department (HOSPITAL_COMMUNITY)
Admission: EM | Admit: 2012-10-06 | Discharge: 2012-10-07 | Disposition: A | Discharge status: Home / Self Care | Payer: Medicaid Other | Attending: Emergency Medicine | Admitting: Emergency Medicine

## 2012-10-06 ENCOUNTER — Encounter (HOSPITAL_COMMUNITY): Payer: Self-pay | Admitting: *Deleted

## 2012-10-06 DIAGNOSIS — F4310 Post-traumatic stress disorder, unspecified: Secondary | ICD-10-CM | POA: Insufficient documentation

## 2012-10-06 DIAGNOSIS — Z8711 Personal history of peptic ulcer disease: Secondary | ICD-10-CM | POA: Insufficient documentation

## 2012-10-06 DIAGNOSIS — IMO0001 Reserved for inherently not codable concepts without codable children: Secondary | ICD-10-CM | POA: Insufficient documentation

## 2012-10-06 DIAGNOSIS — F329 Major depressive disorder, single episode, unspecified: Secondary | ICD-10-CM | POA: Insufficient documentation

## 2012-10-06 DIAGNOSIS — R079 Chest pain, unspecified: Secondary | ICD-10-CM

## 2012-10-06 DIAGNOSIS — Z91040 Latex allergy status: Secondary | ICD-10-CM | POA: Insufficient documentation

## 2012-10-06 DIAGNOSIS — F3289 Other specified depressive episodes: Secondary | ICD-10-CM | POA: Insufficient documentation

## 2012-10-06 DIAGNOSIS — Z79899 Other long term (current) drug therapy: Secondary | ICD-10-CM | POA: Insufficient documentation

## 2012-10-06 DIAGNOSIS — I10 Essential (primary) hypertension: Secondary | ICD-10-CM | POA: Insufficient documentation

## 2012-10-06 DIAGNOSIS — G40909 Epilepsy, unspecified, not intractable, without status epilepticus: Secondary | ICD-10-CM | POA: Insufficient documentation

## 2012-10-06 DIAGNOSIS — K219 Gastro-esophageal reflux disease without esophagitis: Secondary | ICD-10-CM | POA: Insufficient documentation

## 2012-10-06 DIAGNOSIS — G8929 Other chronic pain: Secondary | ICD-10-CM | POA: Insufficient documentation

## 2012-10-06 DIAGNOSIS — G43909 Migraine, unspecified, not intractable, without status migrainosus: Secondary | ICD-10-CM | POA: Insufficient documentation

## 2012-10-06 DIAGNOSIS — R630 Anorexia: Secondary | ICD-10-CM | POA: Insufficient documentation

## 2012-10-06 DIAGNOSIS — R0789 Other chest pain: Secondary | ICD-10-CM | POA: Insufficient documentation

## 2012-10-06 DIAGNOSIS — G5790 Unspecified mononeuropathy of unspecified lower limb: Secondary | ICD-10-CM | POA: Insufficient documentation

## 2012-10-06 LAB — CBC WITH DIFFERENTIAL/PLATELET
Eosinophils Absolute: 0.1 10*3/uL (ref 0.0–0.7)
Eosinophils Relative: 1 % (ref 0–5)
Hemoglobin: 13.9 g/dL (ref 12.0–15.0)
Lymphocytes Relative: 29 % (ref 12–46)
Lymphs Abs: 2.3 10*3/uL (ref 0.7–4.0)
Monocytes Absolute: 0.6 10*3/uL (ref 0.1–1.0)
Monocytes Relative: 8 % (ref 3–12)

## 2012-10-06 LAB — BASIC METABOLIC PANEL
BUN: 16 mg/dL (ref 6–23)
CO2: 19 mEq/L (ref 19–32)
Calcium: 9.6 mg/dL (ref 8.4–10.5)
Creatinine, Ser: 0.76 mg/dL (ref 0.50–1.10)
Glucose, Bld: 182 mg/dL — ABNORMAL HIGH (ref 70–99)

## 2012-10-06 MED ORDER — ONDANSETRON HCL 4 MG/2ML IJ SOLN
4.0000 mg | Freq: Once | INTRAMUSCULAR | Status: AC
  Administered 2012-10-06: 4 mg via INTRAVENOUS
  Filled 2012-10-06: qty 2

## 2012-10-06 MED ORDER — LORAZEPAM 2 MG/ML IJ SOLN
1.0000 mg | Freq: Once | INTRAMUSCULAR | Status: AC
  Administered 2012-10-06: 1 mg via INTRAVENOUS
  Filled 2012-10-06: qty 1

## 2012-10-06 MED ORDER — SODIUM CHLORIDE 0.9 % IV BOLUS (SEPSIS)
500.0000 mL | Freq: Once | INTRAVENOUS | Status: AC
Start: 2012-10-06 — End: 2012-10-07
  Administered 2012-10-06: 500 mL via INTRAVENOUS

## 2012-10-06 NOTE — ED Notes (Signed)
Patient states that her leg is getting stiff, then her left leg starts shaking, patient starts shaking all over, upper body tenses and she starts staring up at the ceiling. Patient is still able to speak and answer questions appropriately. Dr. Blinda Leatherwood at bedside to re-evaluate patient. Patient settles down at this time, then starts talking to her spouse about "calling the attorney".

## 2012-10-06 NOTE — ED Notes (Signed)
Pt c/o left sided chest pain, "mild seizures today," nausea, and decreased appetite. Pt states she woke up nauseated and hot, then began having seizure like activity.

## 2012-10-06 NOTE — ED Provider Notes (Signed)
CSN: [id number]     Arrival date & time 10/06/12  2212 History  This chart was scribed for Ashley Crease, MD by Greggory Stallion, ED Scribe. This patient was seen in room APA02/APA02 and the patient's care was started at 10:21 PM.   Chief Complaint  Patient presents with  . Seizures  . Chest Pain   The history is provided by the patient. No language interpreter was used.    HPI Comments: Ashley [name] is a 47 y.o. female who presents to the Emergency Department complaining of seizures that started after she woke up this morning. She states she woke up nauseated and hot. She started having sudden onset left sided chest pain and nausea that started about one hour ago. She states when she lays down, the chest pain resolves. Pt denies any other associated symptoms.   Past Medical History  Diagnosis Date  . Fibromyalgia   . Depression   . PTSD (post-traumatic stress disorder)   . Chronic back pain   . Neuropathy     left foot  . Hypertension   . Migraine headache   . Tachycardia   . Gastric ulcer   . Dysrhythmia     palpitations  . GERD (gastroesophageal reflux disease)    Past Surgical History  Procedure Laterality Date  . Abdominal hysterectomy    . Dilation and curettage of uterus    . Cesarean section      2 previous  . Back surgery  10  . Breast surgery  01    both breast/breast reduction  . Cholecystectomy  04  . Breast ductal system excision Left 08/03/2012    Procedure: LEFT NIPPLE DUCT EXCISION;  Surgeon: Wilmon Arms. Corliss Skains, MD;  Location: [facility] OR;  Service: General;  Laterality: Left;   Family History  Problem Relation Age of Onset  . Hypertension Mother   . Hypertension Father   . Hypertension Maternal Grandmother   . Hypertension Maternal Grandfather   . Hypertension Paternal Grandmother   . Hypertension Paternal Grandfather    History  Substance Use Topics  . Smoking status: Never Smoker   . Smokeless tobacco: Not on file     Comment:  marijuna 1 week  ago  ,occ alcohol  . Alcohol Use: Yes     Comment: rarely   OB History   Grav Para Term Preterm Abortions TAB SAB Ect Mult Living   4    2  2   2      Review of Systems  Constitutional: Positive for appetite change.  Cardiovascular: Positive for chest pain.  Gastrointestinal: Positive for nausea.  Neurological: Positive for seizures.  All other systems reviewed and are negative.    Allergies  Other; Sulfa antibiotics; Sulfamethoxazole; Bee venom; Morphine and related; Latex; and Morphine  Home Medications   Current Outpatient Rx  Name  Route  Sig  Dispense  Refill  . butalbital-acetaminophen-caffeine (FIORICET, ESGIC) 50-325-40 MG per tablet   Oral   Take 1 tablet by mouth 2 (two) times daily as needed. For headaches         . cyclobenzaprine (FLEXERIL) 10 MG tablet   Oral   Take 10 mg by mouth 3 (three) times daily as needed. Muscle spasms.         . DULoxetine (CYMBALTA) 60 MG capsule   Oral   Take 60 mg by mouth 2 (two) times daily.         Marland Kitchen gabapentin (NEURONTIN) 800 MG tablet   Oral  Take 800 mg by mouth 3 (three) times daily.          Marland Kitchen HYDROcodone-acetaminophen (NORCO/VICODIN) 5-325 MG per tablet   Oral   Take 1 tablet by mouth every 4 (four) hours as needed for pain.   40 tablet   0   . pantoprazole (PROTONIX) 40 MG tablet   Oral   Take 40 mg by mouth daily.         . promethazine (PHENERGAN) 25 MG tablet   Oral   Take 25 mg by mouth every 6 (six) hours as needed for nausea. Take 25 mg by mouth every 6 (six) hours as needed.         . propranolol (INDERAL) 80 MG tablet   Oral   Take 80 mg by mouth daily.          BP 175/99  Pulse 134  Temp(Src) 98.2 F (36.8 C) (Oral)  Resp 20  Ht 5\' 2"  (1.575 m)  Wt 180 lb (81.647 kg)  BMI 32.91 kg/m2  SpO2 99%  Physical Exam  Nursing note and vitals reviewed. Constitutional: She is oriented to person, place, and time. She appears well-developed and well-nourished. No distress.  HENT:   Head: Normocephalic and atraumatic.  Right Ear: Hearing normal.  Left Ear: Hearing normal.  Nose: Nose normal.  Mouth/Throat: Oropharynx is clear and moist and mucous membranes are normal.  Eyes: Conjunctivae and EOM are normal. Pupils are equal, round, and reactive to light.  Neck: Normal range of motion. Neck supple.  Cardiovascular: Regular rhythm, S1 normal and S2 normal.  Exam reveals no gallop and no friction rub.   No murmur heard. Pulmonary/Chest: Effort normal and breath sounds normal. No respiratory distress. She exhibits no tenderness.  Abdominal: Soft. Normal appearance and bowel sounds are normal. There is no hepatosplenomegaly. There is no tenderness. There is no rebound, no guarding, no tenderness at McBurney's point and negative Murphy's sign. No hernia.  Musculoskeletal: Normal range of motion.  Neurological: She is alert and oriented to person, place, and time. She has normal strength. No cranial nerve deficit or sensory deficit. Coordination normal. GCS eye subscore is 4. GCS verbal subscore is 5. GCS motor subscore is 6.  Skin: Skin is warm, dry and intact. No rash noted. No cyanosis.  Psychiatric: She has a normal mood and affect. Her speech is normal and behavior is normal. Thought content normal.    ED Course  Procedures (including critical care time)   Date: 10/06/2012  Rate: 119  Rhythm: sinus tachycardia  QRS Axis: normal  Intervals: normal  ST/T Wave abnormalities: normal  Conduction Disutrbances:none  Narrative Interpretation:   Old EKG Reviewed: tachy, o/w no change    DIAGNOSTIC STUDIES: Oxygen Saturation is 99% on RA, normal by my interpretation.    COORDINATION OF CARE: 10:25 PM-Discussed treatment plan which includes labs with pt at bedside and pt agreed to plan.   Labs Review Labs Reviewed  BASIC METABOLIC PANEL - Abnormal; Notable for the following:    Glucose, Bld 182 (*)    All other components within normal limits  CBC WITH  DIFFERENTIAL  TROPONIN I  URINALYSIS, ROUTINE W REFLEX MICROSCOPIC   Imaging Review Dg Chest Port 1 View  10/06/2012   *RADIOLOGY REPORT*  Clinical Data: Chest pain and shortness of breath.  History of hypertension, tachycardia, gastroesophageal reflux disease.  PORTABLE CHEST - 1 VIEW  Comparison: 07/26/2012  Findings: Shallow inspiration. The heart size and pulmonary vascularity are normal. The lungs  appear clear and expanded without focal air space disease or consolidation. No blunting of the costophrenic angles.  No pneumothorax.  Mediastinal contours appear intact.  No significant change since previous study.  IMPRESSION: No evidence of active pulmonary disease.   Original Report Authenticated By: Burman Nieves, M.D.    MDM  Diagnosis: 1. Hypertension 2. Chest pain 3. Elevated blood sugar 4. Presyncope  Patient presents to the ER for evaluation of chest pain and feeling like she is going to pass out. Patient reports that she awakened in a warm room and was very diaphoretic, nauseated. She went to a friend's house that has air conditioning, but symptoms did not improve. She then started having chest pain. Significant other reports that she has been having tremors and shaking all over. He is concerned that she might be having seizures. The symptoms have been present here in the ER, she does shake all over, but is awake and alert, talkative during the episodes, clearly not a seizure.  The patient is, however, is hypertensive on arrival. She was also significantly tachycardic. Heart rate 140-150 range, sinus tachycardia. Blood pressure and tachycardia did improve with Ativan and IV fluids. She was still slightly tachycardic, however, and therefore CT angiography was performed to rule out PE.  CT scan was not returned at time of shift change. Case was signed out to [facility] to follow up on CT and disposition the patient.    I personally performed the services described in this  documentation, which was scribed in my presence. The recorded information has been reviewed and is accurate.    Ashley Crease, MD 10/07/12 [phone]

## 2012-10-07 ENCOUNTER — Emergency Department (HOSPITAL_COMMUNITY): Payer: Medicaid Other

## 2012-10-07 LAB — TROPONIN I: Troponin I: <0.3 ng/mL (ref <0.30)

## 2012-10-07 MED ORDER — HYDROCODONE-ACETAMINOPHEN 5-325 MG PO TABS: 1.0000 | ORAL_TABLET | Freq: Four times a day (QID) | ORAL | Status: DC | PRN

## 2012-10-07 MED ORDER — IOHEXOL 350 MG/ML SOLN
100.0000 mL | Freq: Once | INTRAVENOUS | Status: AC | PRN
  Administered 2012-10-07: 100 mL via INTRAVENOUS

## 2012-10-20 DIAGNOSIS — F332 Major depressive disorder, recurrent severe without psychotic features: Secondary | ICD-10-CM | POA: Diagnosis not present

## 2012-11-05 ENCOUNTER — Emergency Department (HOSPITAL_COMMUNITY): Payer: No Typology Code available for payment source

## 2012-11-05 ENCOUNTER — Emergency Department (HOSPITAL_COMMUNITY)
Admission: EM | Admit: 2012-11-05 | Discharge: 2012-11-05 | Disposition: A | Discharge status: Home / Self Care | Payer: No Typology Code available for payment source | Attending: Emergency Medicine | Admitting: Emergency Medicine

## 2012-11-05 ENCOUNTER — Encounter (HOSPITAL_COMMUNITY): Payer: Self-pay | Admitting: Emergency Medicine

## 2012-11-05 DIAGNOSIS — S3981XA Other specified injuries of abdomen, initial encounter: Secondary | ICD-10-CM | POA: Insufficient documentation

## 2012-11-05 DIAGNOSIS — S298XXA Other specified injuries of thorax, initial encounter: Secondary | ICD-10-CM | POA: Insufficient documentation

## 2012-11-05 DIAGNOSIS — K219 Gastro-esophageal reflux disease without esophagitis: Secondary | ICD-10-CM | POA: Diagnosis not present

## 2012-11-05 DIAGNOSIS — Z91040 Latex allergy status: Secondary | ICD-10-CM | POA: Diagnosis not present

## 2012-11-05 DIAGNOSIS — S161XXA Strain of muscle, fascia and tendon at neck level, initial encounter: Secondary | ICD-10-CM

## 2012-11-05 DIAGNOSIS — S0990XA Unspecified injury of head, initial encounter: Secondary | ICD-10-CM | POA: Insufficient documentation

## 2012-11-05 DIAGNOSIS — Z8669 Personal history of other diseases of the nervous system and sense organs: Secondary | ICD-10-CM | POA: Insufficient documentation

## 2012-11-05 DIAGNOSIS — S39012A Strain of muscle, fascia and tendon of lower back, initial encounter: Secondary | ICD-10-CM

## 2012-11-05 DIAGNOSIS — Y9389 Activity, other specified: Secondary | ICD-10-CM | POA: Diagnosis not present

## 2012-11-05 DIAGNOSIS — F329 Major depressive disorder, single episode, unspecified: Secondary | ICD-10-CM | POA: Insufficient documentation

## 2012-11-05 DIAGNOSIS — Z79899 Other long term (current) drug therapy: Secondary | ICD-10-CM | POA: Insufficient documentation

## 2012-11-05 DIAGNOSIS — Y92410 Unspecified street and highway as the place of occurrence of the external cause: Secondary | ICD-10-CM | POA: Diagnosis not present

## 2012-11-05 DIAGNOSIS — S335XXA Sprain of ligaments of lumbar spine, initial encounter: Secondary | ICD-10-CM | POA: Insufficient documentation

## 2012-11-05 DIAGNOSIS — S139XXA Sprain of joints and ligaments of unspecified parts of neck, initial encounter: Secondary | ICD-10-CM | POA: Insufficient documentation

## 2012-11-05 DIAGNOSIS — S80211A Abrasion, right knee, initial encounter: Secondary | ICD-10-CM

## 2012-11-05 DIAGNOSIS — G43909 Migraine, unspecified, not intractable, without status migrainosus: Secondary | ICD-10-CM | POA: Diagnosis not present

## 2012-11-05 DIAGNOSIS — M25561 Pain in right knee: Secondary | ICD-10-CM

## 2012-11-05 DIAGNOSIS — I10 Essential (primary) hypertension: Secondary | ICD-10-CM | POA: Insufficient documentation

## 2012-11-05 DIAGNOSIS — IMO0002 Reserved for concepts with insufficient information to code with codable children: Secondary | ICD-10-CM | POA: Insufficient documentation

## 2012-11-05 DIAGNOSIS — M25562 Pain in left knee: Secondary | ICD-10-CM

## 2012-11-05 DIAGNOSIS — F3289 Other specified depressive episodes: Secondary | ICD-10-CM | POA: Insufficient documentation

## 2012-11-05 LAB — CBC WITH DIFFERENTIAL/PLATELET
Basophils Relative: 0 % (ref 0–1)
Eosinophils Relative: 1 % (ref 0–5)
HCT: 38.3 % (ref 36.0–46.0)
Hemoglobin: 13.1 g/dL (ref 12.0–15.0)
MCH: 28.8 pg (ref 26.0–34.0)
MCHC: 34.2 g/dL (ref 30.0–36.0)
MCV: 84.2 fL (ref 78.0–100.0)
Monocytes Absolute: 0.6 10*3/uL (ref 0.1–1.0)
Monocytes Relative: 7 % (ref 3–12)
Neutro Abs: 6.8 10*3/uL (ref 1.7–7.7)

## 2012-11-05 LAB — BASIC METABOLIC PANEL
BUN: 15 mg/dL (ref 6–23)
Chloride: 102 mEq/L (ref 96–112)
Creatinine, Ser: 0.67 mg/dL (ref 0.50–1.10)
GFR calc Af Amer: >90 mL/min (ref >90)

## 2012-11-05 MED ORDER — IOHEXOL 300 MG/ML  SOLN
100.0000 mL | Freq: Once | INTRAMUSCULAR | Status: AC | PRN
  Administered 2012-11-05: 100 mL via INTRAVENOUS

## 2012-11-05 MED ORDER — HYDROMORPHONE HCL PF 1 MG/ML IJ SOLN
1.0000 mg | Freq: Once | INTRAMUSCULAR | Status: AC
  Administered 2012-11-05: 1 mg via INTRAVENOUS
  Filled 2012-11-05: qty 1

## 2012-11-05 MED ORDER — SODIUM CHLORIDE 0.9 % IV SOLN: INTRAVENOUS | Status: DC

## 2012-11-05 MED ORDER — ONDANSETRON HCL 4 MG/2ML IJ SOLN
4.0000 mg | Freq: Once | INTRAMUSCULAR | Status: AC
  Administered 2012-11-05: 4 mg via INTRAVENOUS
  Filled 2012-11-05: qty 2

## 2012-11-05 MED ORDER — SODIUM CHLORIDE 0.9 % IV BOLUS (SEPSIS)
500.0000 mL | Freq: Once | INTRAVENOUS | Status: AC
  Administered 2012-11-05: 500 mL via INTRAVENOUS

## 2012-11-05 MED ORDER — HYDROCODONE-ACETAMINOPHEN 5-325 MG PO TABS: 1.0000 | ORAL_TABLET | Freq: Four times a day (QID) | ORAL | Status: DC | PRN
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
## 2012-11-05 NOTE — ED Notes (Signed)
Patient transported to XR. 

## 2012-11-05 NOTE — ED Notes (Signed)
Discharge instructions reviewed. Pt verbalized understanding.  
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
## 2012-12-10 ENCOUNTER — Encounter: Payer: Self-pay | Admitting: Neurology

## 2012-12-13 ENCOUNTER — Encounter: Payer: Self-pay | Admitting: Neurology

## 2012-12-13 ENCOUNTER — Ambulatory Visit (INDEPENDENT_AMBULATORY_CARE_PROVIDER_SITE_OTHER): Payer: Medicare Other | Admitting: Neurology

## 2012-12-13 ENCOUNTER — Encounter (INDEPENDENT_AMBULATORY_CARE_PROVIDER_SITE_OTHER): Payer: Self-pay

## 2012-12-13 VITALS — BP 169/107 | HR 85 | Ht 64.0 in | Wt 190.0 lb

## 2012-12-13 DIAGNOSIS — IMO0001 Reserved for inherently not codable concepts without codable children: Secondary | ICD-10-CM

## 2012-12-13 DIAGNOSIS — M797 Fibromyalgia: Secondary | ICD-10-CM

## 2012-12-13 DIAGNOSIS — M79609 Pain in unspecified limb: Secondary | ICD-10-CM

## 2012-12-13 NOTE — Patient Instructions (Signed)
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
## 2012-12-20 ENCOUNTER — Telehealth: Payer: Self-pay | Admitting: Neurology

## 2012-12-20 DIAGNOSIS — M4807 Spinal stenosis, lumbosacral region: Secondary | ICD-10-CM

## 2012-12-20 DIAGNOSIS — M5090 Cervical disc disorder, unspecified, unspecified cervical region: Secondary | ICD-10-CM

## 2012-12-20 NOTE — Telephone Encounter (Signed)
Please advise 

## 2012-12-20 NOTE — Telephone Encounter (Signed)
Called patient. MRI evaluation of the lumbosacral spine shows a large L4-5 disc herniation off to the left creating severe spinal stenosis, impinging the left L5 nerve root. There is a disc herniation at the L5-S1 level, impinging the left S1 nerve root. The patient reports urinary incontinence, and surgery may be indicated for the low back. The patient has numbness and some discomfort down the left leg. In the cervical spine, patient has a disc herniation at the C6-7 level to the left, and some right-sided foraminal stenosis at the C5-6 level. This potentially could be the source of the left shoulder and arm pain, but the patient will be undergoing MRI evaluation of the left shoulder joint. I will send the patient for an epidural steroid injection of the cervical spine. The patient will be referred for a neurosurgical evaluation of the low back.

## 2012-12-21 ENCOUNTER — Other Ambulatory Visit: Payer: Self-pay | Admitting: Neurology

## 2012-12-21 DIAGNOSIS — M5090 Cervical disc disorder, unspecified, unspecified cervical region: Secondary | ICD-10-CM

## 2012-12-24 ENCOUNTER — Ambulatory Visit
Admission: RE | Admit: 2012-12-24 | Discharge: 2012-12-24 | Disposition: A | Discharge status: Home / Self Care | Payer: Self-pay | Source: Ambulatory Visit | Attending: Neurology | Admitting: Neurology

## 2012-12-24 VITALS — BP 122/71 | HR 45

## 2012-12-24 DIAGNOSIS — M79609 Pain in unspecified limb: Secondary | ICD-10-CM

## 2012-12-24 DIAGNOSIS — M5090 Cervical disc disorder, unspecified, unspecified cervical region: Secondary | ICD-10-CM

## 2012-12-24 MED ORDER — IOHEXOL 300 MG/ML  SOLN
1.0000 mL | Freq: Once | INTRAMUSCULAR | Status: AC | PRN
  Administered 2012-12-24: 1 mL via INTRAVENOUS

## 2012-12-24 MED ORDER — TRIAMCINOLONE ACETONIDE 40 MG/ML IJ SUSP (RADIOLOGY)
60.0000 mg | Freq: Once | INTRAMUSCULAR | Status: AC
  Administered 2012-12-24: 60 mg via EPIDURAL

## 2012-12-27 ENCOUNTER — Ambulatory Visit: Payer: No Typology Code available for payment source

## 2012-12-30 ENCOUNTER — Ambulatory Visit: Payer: No Typology Code available for payment source

## 2013-01-03 ENCOUNTER — Telehealth: Payer: Self-pay | Admitting: Neurology

## 2013-01-03 DIAGNOSIS — G544 Lumbosacral root disorders, not elsewhere classified: Secondary | ICD-10-CM

## 2013-01-03 MED ORDER — HYDROCODONE-ACETAMINOPHEN 5-325 MG PO TABS: 1.0000 | ORAL_TABLET | Freq: Four times a day (QID) | ORAL | Status: DC | PRN

## 2013-01-03 NOTE — Telephone Encounter (Signed)
Please advise 

## 2013-01-03 NOTE — Telephone Encounter (Signed)
I called patient. The patient has a large herniated disc in the low back, will need surgery. I will make a referral to Dr. Channing Mutters as she wishes. The patient is to call [facility] and cancel her other appointment.

## 2013-01-04 ENCOUNTER — Other Ambulatory Visit: Payer: Self-pay | Admitting: Neurology

## 2013-01-04 NOTE — Telephone Encounter (Signed)
Called patient to inform her that her prescription was ready to be picked up, patient verbalized understanding. °

## 2013-01-11 ENCOUNTER — Other Ambulatory Visit: Payer: Self-pay | Admitting: Neurosurgery

## 2013-01-11 ENCOUNTER — Encounter (HOSPITAL_COMMUNITY): Payer: Self-pay | Admitting: Pharmacy Technician

## 2013-01-11 DIAGNOSIS — M47812 Spondylosis without myelopathy or radiculopathy, cervical region: Secondary | ICD-10-CM | POA: Diagnosis not present

## 2013-01-11 DIAGNOSIS — M5020 Other cervical disc displacement, unspecified cervical region: Secondary | ICD-10-CM | POA: Diagnosis not present

## 2013-01-11 DIAGNOSIS — E669 Obesity, unspecified: Secondary | ICD-10-CM | POA: Diagnosis not present

## 2013-01-11 DIAGNOSIS — M5126 Other intervertebral disc displacement, lumbar region: Secondary | ICD-10-CM | POA: Diagnosis not present

## 2013-01-12 ENCOUNTER — Other Ambulatory Visit: Payer: Self-pay | Admitting: Neurology

## 2013-01-12 DIAGNOSIS — M542 Cervicalgia: Secondary | ICD-10-CM

## 2013-01-12 DIAGNOSIS — M25512 Pain in left shoulder: Secondary | ICD-10-CM

## 2013-01-12 NOTE — Progress Notes (Signed)
Nurse called Darl Pikes at Dr. Sueanne Margarita office and requested that Dr. Franky Macho sign orders at his earliest convenience as patient is having PAT appointment tomorrow.

## 2013-01-12 NOTE — Pre-Procedure Instructions (Signed)
Daesha R Setzer  01/12/2013   Your procedure is scheduled on:  Friday January 21, 2013 @ 9:57 am.  Report to [facility] Short Stay Entrance "A" Admitting at 7:00 AM.  Call this number if you have problems the morning of surgery: [phone]   Remember:   Do not eat food or drink liquids after midnight.   Take these medicines the morning of surgery with A SIP OF WATER: Duloxetine (Cymbalta), Gabapentin (Neurontin), Hydrocodone if needed for pain, Pantoprazole (Protonix), Promethazine (Phenergan) if needed for nausea, and Propranolol (Inderal)   Do not wear jewelry, make-up or nail polish.  Do not wear lotions, powders, or perfumes. You may wear deodorant.  Do not shave 48 hours prior to surgery.   Do not bring valuables to the hospital.  [facility] is not responsible for any belongings or valuables.               Contacts, dentures or bridgework may not be worn into surgery.  Leave suitcase in the car. After surgery it may be brought to your room.  For patients admitted to the hospital, discharge time is determined by your treatment team.               Patients discharged the day of surgery will not be allowed to drive home.  Name and phone number of your driver: Family/Friend  Special Instructions: Shower using CHG 2 nights before surgery and the night before surgery.  If you shower the day of surgery use CHG.  Use special wash - you have one bottle of CHG for all showers.  You should use approximately 1/3 of the bottle for each shower.   Please read over the following fact sheets that you were given: Pain Booklet, Coughing and Deep Breathing, MRSA Information and Surgical Site Infection Prevention

## 2013-01-13 ENCOUNTER — Encounter (HOSPITAL_COMMUNITY): Payer: Self-pay

## 2013-01-13 ENCOUNTER — Encounter (HOSPITAL_COMMUNITY)
Admission: RE | Admit: 2013-01-13 | Discharge: 2013-01-13 | Disposition: A | Discharge status: Home / Self Care | Payer: Medicaid Other | Source: Ambulatory Visit | Attending: Neurosurgery | Admitting: Neurosurgery

## 2013-01-13 DIAGNOSIS — Z01812 Encounter for preprocedural laboratory examination: Secondary | ICD-10-CM | POA: Insufficient documentation

## 2013-01-13 HISTORY — DX: Anxiety disorder, unspecified: F41.9

## 2013-01-13 HISTORY — DX: Unspecified urinary incontinence: R32

## 2013-01-13 LAB — COMPREHENSIVE METABOLIC PANEL
BUN: 15 mg/dL (ref 6–23)
CO2: 26 mEq/L (ref 19–32)
Calcium: 9.2 mg/dL (ref 8.4–10.5)
Creatinine, Ser: 0.65 mg/dL (ref 0.50–1.10)
GFR calc Af Amer: >90 mL/min (ref >90)
GFR calc non Af Amer: >90 mL/min (ref >90)
Glucose, Bld: 107 mg/dL — ABNORMAL HIGH (ref 70–99)
Total Protein: 6.9 g/dL (ref 6.0–8.3)

## 2013-01-13 LAB — CBC
Hemoglobin: 13.6 g/dL (ref 12.0–15.0)
MCH: 29.2 pg (ref 26.0–34.0)
MCHC: 32.6 g/dL (ref 30.0–36.0)
MCV: 89.5 fL (ref 78.0–100.0)
Platelets: 318 10*3/uL (ref 150–400)
RBC: 4.66 MIL/uL (ref 3.87–5.11)

## 2013-01-13 LAB — SURGICAL PCR SCREEN: Staphylococcus aureus: NEGATIVE

## 2013-01-13 NOTE — Progress Notes (Signed)
Patient informed Nurse that she had a stress test at [facility] in [place] and a cardiac cath at [facility]. Will request records. PCP is Dr. Fara Chute at [facility] in [place], [place]. Neurologist is Dr. Lesia Sago at [facility] in [place]. Patient denied having any heart palpitations or acute cardiac issues.

## 2013-01-13 NOTE — Progress Notes (Signed)
01/13/13 1037  OBSTRUCTIVE SLEEP APNEA  Have you ever been diagnosed with sleep apnea through a sleep study? No  Do you snore loudly (loud enough to be heard through closed doors)?  1  Do you often feel tired, fatigued, or sleepy during the daytime? 1  Has anyone observed you stop breathing during your sleep? 1  Do you have, or are you being treated for high blood pressure? 1  BMI more than 35 kg/m2? 0  Age over 47 years old? 0  Neck circumference greater than 40 cm/18 inches? 0  Gender: 0  Obstructive Sleep Apnea Score 4  Score 4 or greater  Results sent to PCP   This patient has screened at risk for sleep apnea using the STOP Bang tool during a pre-surgical visit. A score of 4 or greater is at risk for sleep apnea.

## 2013-01-20 MED ORDER — CEFAZOLIN SODIUM-DEXTROSE 2-3 GM-% IV SOLR
2.0000 g | INTRAVENOUS | Status: AC
  Administered 2013-01-21 (×2): 2 g via INTRAVENOUS
  Filled 2013-01-20: qty 50

## 2013-01-21 ENCOUNTER — Ambulatory Visit (HOSPITAL_COMMUNITY): Payer: Medicaid Other

## 2013-01-21 ENCOUNTER — Encounter (HOSPITAL_COMMUNITY): Payer: Self-pay | Admitting: Anesthesiology

## 2013-01-21 ENCOUNTER — Encounter (HOSPITAL_COMMUNITY): Payer: Medicaid Other | Admitting: Anesthesiology

## 2013-01-21 ENCOUNTER — Ambulatory Visit (HOSPITAL_COMMUNITY)
Admission: RE | Admit: 2013-01-21 | Discharge: 2013-01-22 | Disposition: A | Discharge status: Home / Self Care | Payer: Medicaid Other | Source: Ambulatory Visit | Attending: Neurosurgery | Admitting: Neurosurgery

## 2013-01-21 ENCOUNTER — Encounter (HOSPITAL_COMMUNITY)
Admission: RE | Disposition: A | Discharge status: Home / Self Care | Payer: Self-pay | Source: Ambulatory Visit | Attending: Neurosurgery

## 2013-01-21 ENCOUNTER — Ambulatory Visit (HOSPITAL_COMMUNITY): Payer: Medicaid Other | Admitting: Anesthesiology

## 2013-01-21 DIAGNOSIS — M5020 Other cervical disc displacement, unspecified cervical region: Secondary | ICD-10-CM | POA: Diagnosis not present

## 2013-01-21 DIAGNOSIS — M47812 Spondylosis without myelopathy or radiculopathy, cervical region: Secondary | ICD-10-CM | POA: Insufficient documentation

## 2013-01-21 DIAGNOSIS — Z6832 Body mass index (BMI) 32.0-32.9, adult: Secondary | ICD-10-CM | POA: Insufficient documentation

## 2013-01-21 DIAGNOSIS — M5126 Other intervertebral disc displacement, lumbar region: Secondary | ICD-10-CM | POA: Insufficient documentation

## 2013-01-21 DIAGNOSIS — IMO0001 Reserved for inherently not codable concepts without codable children: Secondary | ICD-10-CM | POA: Insufficient documentation

## 2013-01-21 DIAGNOSIS — I10 Essential (primary) hypertension: Secondary | ICD-10-CM | POA: Insufficient documentation

## 2013-01-21 HISTORY — PX: ANTERIOR CERVICAL DECOMP/DISCECTOMY FUSION: SHX1161

## 2013-01-21 HISTORY — PX: LUMBAR LAMINECTOMY/DECOMPRESSION MICRODISCECTOMY: SHX5026

## 2013-01-21 SURGERY — ANTERIOR CERVICAL DECOMPRESSION/DISCECTOMY FUSION 2 LEVELS
Anesthesia: General

## 2013-01-21 MED ORDER — SODIUM CHLORIDE 0.9 % IJ SOLN: 3.0000 mL | INTRAMUSCULAR | Status: DC | PRN

## 2013-01-21 MED ORDER — ZOLPIDEM TARTRATE 5 MG PO TABS: 5.0000 mg | ORAL_TABLET | Freq: Every evening | ORAL | Status: DC | PRN

## 2013-01-21 MED ORDER — THROMBIN 5000 UNITS EX SOLR
CUTANEOUS | Status: DC | PRN
  Administered 2013-01-21 (×4): 5000 [IU] via TOPICAL

## 2013-01-21 MED ORDER — PANTOPRAZOLE SODIUM 40 MG PO TBEC
40.0000 mg | DELAYED_RELEASE_TABLET | Freq: Every day | ORAL | Status: DC
  Administered 2013-01-22: 40 mg via ORAL
  Filled 2013-01-21: qty 1

## 2013-01-21 MED ORDER — CEFAZOLIN SODIUM 1-5 GM-% IV SOLN
1.0000 g | Freq: Three times a day (TID) | INTRAVENOUS | Status: AC
Start: 2013-01-21 — End: 2013-01-22
  Administered 2013-01-21 – 2013-01-22 (×2): 1 g via INTRAVENOUS
  Filled 2013-01-21 (×2): qty 50

## 2013-01-21 MED ORDER — KETOROLAC TROMETHAMINE 30 MG/ML IJ SOLN
30.0000 mg | Freq: Four times a day (QID) | INTRAMUSCULAR | Status: DC
  Administered 2013-01-21 – 2013-01-22 (×3): 30 mg via INTRAVENOUS
  Filled 2013-01-21 (×6): qty 1

## 2013-01-21 MED ORDER — MIDAZOLAM HCL 5 MG/5ML IJ SOLN
INTRAMUSCULAR | Status: DC | PRN
  Administered 2013-01-21: 2 mg via INTRAVENOUS

## 2013-01-21 MED ORDER — HYDROCODONE-ACETAMINOPHEN 5-325 MG PO TABS
1.0000 | ORAL_TABLET | ORAL | Status: DC | PRN
  Administered 2013-01-22: 2 via ORAL
  Filled 2013-01-21: qty 2

## 2013-01-21 MED ORDER — HEMOSTATIC AGENTS (NO CHARGE) OPTIME
TOPICAL | Status: DC | PRN
  Administered 2013-01-21 (×2): 1 via TOPICAL

## 2013-01-21 MED ORDER — ACETAMINOPHEN 650 MG RE SUPP: 650.0000 mg | RECTAL | Status: DC | PRN

## 2013-01-21 MED ORDER — LACTATED RINGERS IV SOLN
INTRAVENOUS | Status: DC | PRN
  Administered 2013-01-21 (×2): via INTRAVENOUS

## 2013-01-21 MED ORDER — LIDOCAINE HCL (CARDIAC) 20 MG/ML IV SOLN
INTRAVENOUS | Status: DC | PRN
  Administered 2013-01-21: 50 mg via INTRAVENOUS

## 2013-01-21 MED ORDER — PROPOFOL 10 MG/ML IV BOLUS
INTRAVENOUS | Status: DC | PRN
  Administered 2013-01-21: 180 mg via INTRAVENOUS

## 2013-01-21 MED ORDER — OXYCODONE HCL 5 MG/5ML PO SOLN: 5.0000 mg | Freq: Once | ORAL | Status: AC | PRN

## 2013-01-21 MED ORDER — ACETAMINOPHEN 325 MG PO TABS: 650.0000 mg | ORAL_TABLET | ORAL | Status: DC | PRN

## 2013-01-21 MED ORDER — HYDROCODONE-ACETAMINOPHEN 5-325 MG PO TABS
1.0000 | ORAL_TABLET | Freq: Once | ORAL | Status: DC
  Administered 2013-01-21: 1 via ORAL

## 2013-01-21 MED ORDER — OXYCODONE HCL 5 MG PO TABS
5.0000 mg | ORAL_TABLET | Freq: Once | ORAL | Status: AC | PRN
  Administered 2013-01-21: 5 mg via ORAL

## 2013-01-21 MED ORDER — DIAZEPAM 5 MG PO TABS
ORAL_TABLET | ORAL | Status: AC
  Filled 2013-01-21: qty 1

## 2013-01-21 MED ORDER — LIDOCAINE-EPINEPHRINE 0.5 %-1:200000 IJ SOLN
INTRAMUSCULAR | Status: DC | PRN
  Administered 2013-01-21: 10 mL

## 2013-01-21 MED ORDER — FENTANYL CITRATE 0.05 MG/ML IJ SOLN
INTRAMUSCULAR | Status: AC
  Filled 2013-01-21: qty 2

## 2013-01-21 MED ORDER — NEOSTIGMINE METHYLSULFATE 1 MG/ML IJ SOLN
INTRAMUSCULAR | Status: DC | PRN
  Administered 2013-01-21: 3 mg via INTRAVENOUS

## 2013-01-21 MED ORDER — POTASSIUM CHLORIDE IN NACL 20-0.9 MEQ/L-% IV SOLN
INTRAVENOUS | Status: DC
  Filled 2013-01-21 (×3): qty 1000

## 2013-01-21 MED ORDER — 0.9 % SODIUM CHLORIDE (POUR BTL) OPTIME
TOPICAL | Status: DC | PRN
  Administered 2013-01-21 (×2): 1000 mL

## 2013-01-21 MED ORDER — ARTIFICIAL TEARS OP OINT
TOPICAL_OINTMENT | OPHTHALMIC | Status: DC | PRN
  Administered 2013-01-21: 1 via OPHTHALMIC

## 2013-01-21 MED ORDER — ONDANSETRON HCL 4 MG/2ML IJ SOLN
4.0000 mg | Freq: Once | INTRAMUSCULAR | Status: AC | PRN
  Administered 2013-01-21: 4 mg via INTRAVENOUS

## 2013-01-21 MED ORDER — GABAPENTIN 800 MG PO TABS
800.0000 mg | ORAL_TABLET | Freq: Three times a day (TID) | ORAL | Status: DC
  Filled 2013-01-21 (×2): qty 1

## 2013-01-21 MED ORDER — HYDROCODONE-ACETAMINOPHEN 5-325 MG PO TABS
ORAL_TABLET | ORAL | Status: AC
  Administered 2013-01-21: 1 via ORAL
  Filled 2013-01-21: qty 1

## 2013-01-21 MED ORDER — VECURONIUM BROMIDE 10 MG IV SOLR
INTRAVENOUS | Status: DC | PRN
  Administered 2013-01-21: 2 mg via INTRAVENOUS
  Administered 2013-01-21: 1 mg via INTRAVENOUS
  Administered 2013-01-21: 2 mg via INTRAVENOUS
  Administered 2013-01-21: 1 mg via INTRAVENOUS
  Administered 2013-01-21: 2 mg via INTRAVENOUS

## 2013-01-21 MED ORDER — DEXAMETHASONE SODIUM PHOSPHATE 4 MG/ML IJ SOLN
INTRAMUSCULAR | Status: DC | PRN
  Administered 2013-01-21: 8 mg via INTRAVENOUS

## 2013-01-21 MED ORDER — POLYETHYLENE GLYCOL 3350 17 G PO PACK
17.0000 g | PACK | Freq: Every day | ORAL | Status: DC | PRN
  Filled 2013-01-21: qty 1

## 2013-01-21 MED ORDER — OXYCODONE-ACETAMINOPHEN 5-325 MG PO TABS
1.0000 | ORAL_TABLET | ORAL | Status: DC | PRN
  Administered 2013-01-21 – 2013-01-22 (×3): 2 via ORAL
  Filled 2013-01-21 (×3): qty 2

## 2013-01-21 MED ORDER — GABAPENTIN 400 MG PO CAPS
800.0000 mg | ORAL_CAPSULE | Freq: Three times a day (TID) | ORAL | Status: DC
  Administered 2013-01-21 – 2013-01-22 (×3): 800 mg via ORAL
  Filled 2013-01-21 (×5): qty 2

## 2013-01-21 MED ORDER — ROCURONIUM BROMIDE 100 MG/10ML IV SOLN
INTRAVENOUS | Status: DC | PRN
  Administered 2013-01-21: 50 mg via INTRAVENOUS

## 2013-01-21 MED ORDER — PHENOL 1.4 % MT LIQD: 1.0000 | OROMUCOSAL | Status: DC | PRN

## 2013-01-21 MED ORDER — DULOXETINE HCL 60 MG PO CPEP
60.0000 mg | ORAL_CAPSULE | Freq: Two times a day (BID) | ORAL | Status: DC
  Administered 2013-01-21 – 2013-01-22 (×2): 60 mg via ORAL
  Filled 2013-01-21 (×3): qty 1

## 2013-01-21 MED ORDER — DIAZEPAM 5 MG PO TABS
5.0000 mg | ORAL_TABLET | Freq: Four times a day (QID) | ORAL | Status: DC | PRN
  Administered 2013-01-21 – 2013-01-22 (×3): 5 mg via ORAL
  Filled 2013-01-21 (×2): qty 1

## 2013-01-21 MED ORDER — HYDROMORPHONE HCL PF 1 MG/ML IJ SOLN
0.5000 mg | INTRAMUSCULAR | Status: DC | PRN
  Administered 2013-01-21 – 2013-01-22 (×4): 1 mg via INTRAVENOUS
  Filled 2013-01-21 (×4): qty 1

## 2013-01-21 MED ORDER — PROPRANOLOL HCL ER 80 MG PO CP24
80.0000 mg | ORAL_CAPSULE | Freq: Every day | ORAL | Status: DC
  Administered 2013-01-22: 80 mg via ORAL
  Filled 2013-01-21 (×2): qty 1

## 2013-01-21 MED ORDER — ONDANSETRON HCL 4 MG/2ML IJ SOLN
INTRAMUSCULAR | Status: AC
  Filled 2013-01-21: qty 2

## 2013-01-21 MED ORDER — ONDANSETRON HCL 4 MG/2ML IJ SOLN
4.0000 mg | Freq: Once | INTRAMUSCULAR | Status: AC
  Administered 2013-01-21: 4 mg via INTRAVENOUS

## 2013-01-21 MED ORDER — DEXAMETHASONE 4 MG PO TABS
4.0000 mg | ORAL_TABLET | Freq: Four times a day (QID) | ORAL | Status: AC
  Administered 2013-01-21: 4 mg via ORAL
  Filled 2013-01-21: qty 1

## 2013-01-21 MED ORDER — FENTANYL CITRATE 0.05 MG/ML IJ SOLN
INTRAMUSCULAR | Status: DC | PRN
  Administered 2013-01-21 (×10): 50 ug via INTRAVENOUS

## 2013-01-21 MED ORDER — EPHEDRINE SULFATE 50 MG/ML IJ SOLN
INTRAMUSCULAR | Status: DC | PRN
  Administered 2013-01-21 (×5): 5 mg via INTRAVENOUS

## 2013-01-21 MED ORDER — GLYCOPYRROLATE 0.2 MG/ML IJ SOLN
INTRAMUSCULAR | Status: DC | PRN
  Administered 2013-01-21: 0.4 mg via INTRAVENOUS

## 2013-01-21 MED ORDER — OXYCODONE HCL 5 MG PO TABS
ORAL_TABLET | ORAL | Status: AC
  Filled 2013-01-21: qty 1

## 2013-01-21 MED ORDER — SODIUM CHLORIDE 0.9 % IJ SOLN
3.0000 mL | Freq: Two times a day (BID) | INTRAMUSCULAR | Status: DC
  Administered 2013-01-21: 3 mL via INTRAVENOUS

## 2013-01-21 MED ORDER — DEXAMETHASONE SODIUM PHOSPHATE 4 MG/ML IJ SOLN: 4.0000 mg | Freq: Four times a day (QID) | INTRAMUSCULAR | Status: AC

## 2013-01-21 MED ORDER — KETOROLAC TROMETHAMINE 30 MG/ML IJ SOLN
INTRAMUSCULAR | Status: AC
  Filled 2013-01-21: qty 1

## 2013-01-21 MED ORDER — MENTHOL 3 MG MT LOZG
1.0000 | LOZENGE | OROMUCOSAL | Status: DC | PRN
  Filled 2013-01-21: qty 9

## 2013-01-21 MED ORDER — FENTANYL CITRATE 0.05 MG/ML IJ SOLN
25.0000 ug | INTRAMUSCULAR | Status: DC | PRN
  Administered 2013-01-21 (×3): 50 ug via INTRAVENOUS

## 2013-01-21 MED ORDER — SENNA 8.6 MG PO TABS
1.0000 | ORAL_TABLET | Freq: Two times a day (BID) | ORAL | Status: DC
  Administered 2013-01-21 – 2013-01-22 (×2): 8.6 mg via ORAL
  Filled 2013-01-21 (×3): qty 1

## 2013-01-21 MED ORDER — ONDANSETRON HCL 4 MG/2ML IJ SOLN
INTRAMUSCULAR | Status: DC | PRN
  Administered 2013-01-21 (×2): 4 mg via INTRAVENOUS

## 2013-01-21 MED ORDER — ONDANSETRON HCL 4 MG/2ML IJ SOLN: 4.0000 mg | INTRAMUSCULAR | Status: DC | PRN

## 2013-01-21 MED ORDER — LACTATED RINGERS IV SOLN
INTRAVENOUS | Status: DC
  Administered 2013-01-21: 07:00:00 via INTRAVENOUS

## 2013-01-21 SURGICAL SUPPLY — 93 items
ADH SKN CLS APL DERMABOND .7 (GAUZE/BANDAGES/DRESSINGS) ×2
ADH SKN CLS LQ APL DERMABOND (GAUZE/BANDAGES/DRESSINGS) ×2
APL SKNCLS STERI-STRIP NONHPOA (GAUZE/BANDAGES/DRESSINGS)
BAG DECANTER FOR FLEXI CONT (MISCELLANEOUS) IMPLANT
BANDAGE GAUZE ELAST BULKY 4 IN (GAUZE/BANDAGES/DRESSINGS) ×6 IMPLANT
BENZOIN TINCTURE PRP APPL 2/3 (GAUZE/BANDAGES/DRESSINGS) IMPLANT
BIT DRILL NEURO 2X3.1 SFT TUCH (MISCELLANEOUS) ×2 IMPLANT
BLADE SURG ROTATE 9660 (MISCELLANEOUS) IMPLANT
BUR DRUM 4.0 (BURR) ×3 IMPLANT
BUR MATCHSTICK NEURO 3.0 LAGG (BURR) ×3 IMPLANT
CANISTER SUCT 3000ML (MISCELLANEOUS) ×3 IMPLANT
CONT SPEC 4OZ CLIKSEAL STRL BL (MISCELLANEOUS) ×6 IMPLANT
DECANTER SPIKE VIAL GLASS SM (MISCELLANEOUS) ×3 IMPLANT
DERMABOND ADHESIVE PROPEN (GAUZE/BANDAGES/DRESSINGS) ×1
DERMABOND ADVANCED (GAUZE/BANDAGES/DRESSINGS) ×1
DERMABOND ADVANCED .7 DNX12 (GAUZE/BANDAGES/DRESSINGS) ×2 IMPLANT
DERMABOND ADVANCED .7 DNX6 (GAUZE/BANDAGES/DRESSINGS) ×2 IMPLANT
DRAPE LAPAROTOMY 100X72 PEDS (DRAPES) ×3 IMPLANT
DRAPE LAPAROTOMY 100X72X124 (DRAPES) ×3 IMPLANT
DRAPE MICROSCOPE LEICA (MISCELLANEOUS) ×6 IMPLANT
DRAPE POUCH INSTRU U-SHP 10X18 (DRAPES) ×6 IMPLANT
DRAPE PROXIMA HALF (DRAPES) ×3 IMPLANT
DRAPE SURG 17X23 STRL (DRAPES) ×3 IMPLANT
DRILL NEURO 2X3.1 SOFT TOUCH (MISCELLANEOUS) ×3
DURAPREP 26ML APPLICATOR (WOUND CARE) ×3 IMPLANT
DURAPREP 6ML APPLICATOR 50/CS (WOUND CARE) ×3 IMPLANT
ELECT COATED BLADE 2.86 ST (ELECTRODE) ×3 IMPLANT
ELECT REM PT RETURN 9FT ADLT (ELECTROSURGICAL) ×3
ELECTRODE REM PT RTRN 9FT ADLT (ELECTROSURGICAL) ×2 IMPLANT
GAUZE SPONGE 4X4 16PLY XRAY LF (GAUZE/BANDAGES/DRESSINGS) IMPLANT
GLOVE BIO SURGEON STRL SZ 6.5 (GLOVE) IMPLANT
GLOVE BIO SURGEON STRL SZ7 (GLOVE) IMPLANT
GLOVE BIO SURGEON STRL SZ7.5 (GLOVE) IMPLANT
GLOVE BIO SURGEON STRL SZ8 (GLOVE) IMPLANT
GLOVE BIO SURGEON STRL SZ8.5 (GLOVE) IMPLANT
GLOVE BIOGEL M 8.0 STRL (GLOVE) IMPLANT
GLOVE BIOGEL PI IND STRL 6.5 (GLOVE) ×2 IMPLANT
GLOVE BIOGEL PI IND STRL 7.5 (GLOVE) ×2 IMPLANT
GLOVE BIOGEL PI IND STRL 8.5 (GLOVE) ×2 IMPLANT
GLOVE BIOGEL PI INDICATOR 6.5 (GLOVE) ×1
GLOVE BIOGEL PI INDICATOR 7.5 (GLOVE) ×1
GLOVE BIOGEL PI INDICATOR 8.5 (GLOVE) ×1
GLOVE ECLIPSE 6.5 STRL STRAW (GLOVE) IMPLANT
GLOVE ECLIPSE 7.0 STRL STRAW (GLOVE) IMPLANT
GLOVE ECLIPSE 7.5 STRL STRAW (GLOVE) IMPLANT
GLOVE ECLIPSE 8.0 STRL XLNG CF (GLOVE) IMPLANT
GLOVE ECLIPSE 8.5 STRL (GLOVE) IMPLANT
GLOVE EXAM NITRILE LRG STRL (GLOVE) IMPLANT
GLOVE EXAM NITRILE MD LF STRL (GLOVE) ×3 IMPLANT
GLOVE EXAM NITRILE XL STR (GLOVE) IMPLANT
GLOVE EXAM NITRILE XS STR PU (GLOVE) IMPLANT
GLOVE INDICATOR 6.5 STRL GRN (GLOVE) IMPLANT
GLOVE INDICATOR 7.0 STRL GRN (GLOVE) IMPLANT
GLOVE INDICATOR 7.5 STRL GRN (GLOVE) IMPLANT
GLOVE INDICATOR 8.0 STRL GRN (GLOVE) IMPLANT
GLOVE INDICATOR 8.5 STRL (GLOVE) IMPLANT
GLOVE OPTIFIT SS 8.0 STRL (GLOVE) IMPLANT
GLOVE SURG SS PI 6.5 STRL IVOR (GLOVE) ×9 IMPLANT
GLOVE SURG SS PI 7.0 STRL IVOR (GLOVE) ×6 IMPLANT
GLOVE SURG SS PI 8.5 STRL IVOR (GLOVE) ×1
GLOVE SURG SS PI 8.5 STRL STRW (GLOVE) ×2 IMPLANT
GOWN BRE IMP SLV AUR LG STRL (GOWN DISPOSABLE) ×9 IMPLANT
GOWN BRE IMP SLV AUR XL STRL (GOWN DISPOSABLE) ×3 IMPLANT
GOWN STRL REIN 2XL LVL4 (GOWN DISPOSABLE) ×3 IMPLANT
KIT BASIN OR (CUSTOM PROCEDURE TRAY) ×3 IMPLANT
KIT ROOM TURNOVER OR (KITS) ×3 IMPLANT
NEEDLE HYPO 25X1 1.5 SAFETY (NEEDLE) ×6 IMPLANT
NEEDLE SPNL 18GX3.5 QUINCKE PK (NEEDLE) ×3 IMPLANT
NEEDLE SPNL 22GX3.5 QUINCKE BK (NEEDLE) ×3 IMPLANT
NS IRRIG 1000ML POUR BTL (IV SOLUTION) ×6 IMPLANT
PACK LAMINECTOMY NEURO (CUSTOM PROCEDURE TRAY) ×6 IMPLANT
PAD ARMBOARD 7.5X6 YLW CONV (MISCELLANEOUS) ×12 IMPLANT
PIN DISTRACTION 14MM (PIN) ×6 IMPLANT
PLATE HELIX R 42MM 2LVL (Plate) ×3 IMPLANT
RUBBERBAND STERILE (MISCELLANEOUS) ×6 IMPLANT
SCREW 4.0X13 (Screw) ×12 IMPLANT
SCREW 4.0X13MM (Screw) ×6 IMPLANT
SPACER PARALLEL 6MM CC ACF (Bone Implant) ×6 IMPLANT
SPONGE GAUZE 4X4 12PLY (GAUZE/BANDAGES/DRESSINGS) IMPLANT
SPONGE INTESTINAL PEANUT (DISPOSABLE) ×3 IMPLANT
SPONGE LAP 4X18 X RAY DECT (DISPOSABLE) IMPLANT
SPONGE SURGIFOAM ABS GEL SZ50 (HEMOSTASIS) ×3 IMPLANT
STRIP CLOSURE SKIN 1/2X4 (GAUZE/BANDAGES/DRESSINGS) IMPLANT
SUT VIC AB 0 CT1 18XCR BRD8 (SUTURE) ×2 IMPLANT
SUT VIC AB 0 CT1 27 (SUTURE)
SUT VIC AB 0 CT1 27XBRD ANTBC (SUTURE) IMPLANT
SUT VIC AB 0 CT1 8-18 (SUTURE) ×3
SUT VIC AB 2-0 CT1 18 (SUTURE) ×3 IMPLANT
SUT VIC AB 3-0 SH 8-18 (SUTURE) ×3 IMPLANT
SYR 20ML ECCENTRIC (SYRINGE) ×6 IMPLANT
TOWEL OR 17X24 6PK STRL BLUE (TOWEL DISPOSABLE) ×6 IMPLANT
TOWEL OR 17X26 10 PK STRL BLUE (TOWEL DISPOSABLE) ×6 IMPLANT
WATER STERILE IRR 1000ML POUR (IV SOLUTION) ×6 IMPLANT

## 2013-01-21 NOTE — Anesthesia Procedure Notes (Signed)
Procedure Name: Intubation Date/Time: 01/21/2013 10:46 AM Performed by: Lovie Chol Pre-anesthesia Checklist: Patient identified, Emergency Drugs available, Suction available, Patient being monitored and Timeout performed Patient Re-evaluated:Patient Re-evaluated prior to inductionOxygen Delivery Method: Circle system utilized Preoxygenation: Pre-oxygenation with 100% oxygen Intubation Type: IV induction Ventilation: Mask ventilation without difficulty and Oral airway inserted - appropriate to patient size Laryngoscope Size: Miller and 2 Grade View: Grade II Tube type: Oral Tube size: 7.0 mm Number of attempts: 1 Airway Equipment and Method: Stylet Placement Confirmation: ETT inserted through vocal cords under direct vision,  positive ETCO2,  CO2 detector and breath sounds checked- equal and bilateral Secured at: 21 cm Tube secured with: Tape Dental Injury: Teeth and Oropharynx as per pre-operative assessment

## 2013-01-21 NOTE — Progress Notes (Signed)
During rounding patient reports HA of 4/10 with nausea. Dr. Noreene Larsson notified orders received and followed as noted

## 2013-01-21 NOTE — Op Note (Signed)
01/21/2013  3:41 PM  PATIENT:  Ashley [name]  47 y.o. female  PRE-OPERATIVE DIAGNOSIS:  cervical herniated disc  lumbar herniated disc  POST-OPERATIVE DIAGNOSIS:  cervical herniated disc  lumbar herniated disc  PROCEDURE:  Procedure(s): Cervical Five-Six Cervical Six-Seven Anterior Cervical Decompression and Fusion with Plating and Bonegraft-Second Procedure Left Lumbar Four-Five Microdiscectomy- First Procedure  SURGEON:  Surgeon(s): Carmela Hurt, MD  ASSISTANTS:elsner  ANESTHESIA:   general  EBL:  Total I/O In: 1750 [I.V.:1750] Out: 650 [Urine:350; Blood:300]  BLOOD ADMINISTERED:none  CELL SAVER GIVEN:none  COUNT:per nursing  DRAINS: none   SPECIMEN:  No Specimen  DICTATION: Mrs. Nero was taken to the operating room, intubated and placed under a general anesthetic without difficulty. She was positioned prone on a Wilson frame with all pressure points padded. Her back was prepped and draped in a sterile manner. I opened the skin with a 10 blade and carried the dissection down to the thoracolumbar fascia. I used both sharp dissection and the monopolar cautery to expose the lamina of L5, and S1. I confirmed my location with an intraoperative xray.  I used the drill, Kerrison punches, and curettes to perform a semihemilaminectomy of L5. I used the punches to remove the ligamentum flavum to expose the thecal sac. I brought the microscope into the operative field. I started our decompression of the spinal canal, thecal sac and Left L5 and S1 root(s). I cauterized epidural veins overlying the disc space then divided them sharply. I opened the disc space with a 15 blade and proceeded with the discectomy. I used pituitary rongeurs, curettes, and other instruments to remove disc material. After the discectomy was completed I inspected the L5 and S1 nerve roots and felt they were decompressed. I explored rostrally, laterally, medially, and caudally and was satisfied with the  decompression. I irrigated the wound, then closed in layers. I approximated the thoracolumbar fascia, subcutaneous, and subcuticular planes with vicryl sutures. I used dermabond for a sterile dressing.   After the dermabond was dry the patient was then repositioned .  She was positioned supine with her head in slight extension on a horseshoe headrest. The neck was prepped and draped in a sterile manner. I infiltrated 5 cc's 1/2%lidocaine/1:200,000 strength epinephrine into the planned incision starting from the midline to the medial border of the left sternocleidomastoid muscle. I opened the incision with a 10 blade and dissected sharply through soft tissue to the platysma. I dissected in the plane superior to the platysma both rostrally and caudally. I then opened the platysma in a horizontal fashion with Metzenbaum scissors, and dissected in the inferior plane rostrally and caudally. With both blunt and sharp technique I created an avascular corridor to the cervical spine. I placed a spinal needle(s) in the disc space at C4/5,and 5/6 . With localization complete I then reflected the longus colli from C5 to C7 and placed self retaining retractors. I opened the disc space(s) at 5/6,6/7 with a 15 blade. I removed disc with curettes, Kerrison punches, and the drill. Using the drill I removed osteophytes and prepared for the decompression.  I decompressed the spinal canal and the C6, and 7 root(s) with the drill, Kerrison punches, and the curettes. I used the microscope to aid in microdissection. I removed the posterior longitudinal ligament to fully expose and decompress the thecal sac at both levels. I exposed the roots laterally taking down the 5/6, and 6/7 uncovertebral joints. With the decompression complete at both levels I moved on to  the arthrodesis. I used the drill to level the surfaces of C5,6,and 7. I removed soft tissue to prepare the disc space and the bony surfaces. I measured the space and placed a  5mm structural allograft into the disc spaces.  We then placed the anterior instrumentation. I placed 2 screws in each vertebral body through the plate. I locked the screws into place. Intraoperative xray showed the graft, plate, and screws to be in good position. I irrigated the wound, achieved hemostasis, and closed the wound in layers. I approximated the platysma, and the subcuticular plane with vicryl sutures. I used Dermabond for a sterile dressing.      PLAN OF CARE: Admit for overnight observation  PATIENT DISPOSITION:  PACU - hemodynamically stable.   Delay start of Pharmacological VTE agent (>24hrs) due to surgical blood loss or risk of bleeding:  yes
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
## 2013-01-21 NOTE — Progress Notes (Signed)
Orthopedic Tech Progress Note Patient Details:  Ashley [name] 01-05-1966 [id number]  Ortho Devices Type of Ortho Device: Soft collar Ortho Device/Splint Location: neck Ortho Device/Splint Interventions: Application   ,  01/21/2013, 9:25 PM

## 2013-01-21 NOTE — Addendum Note (Signed)
Addendum created 01/21/13 1625 by Lovie Chol, CRNA   Modules edited: Anesthesia Flowsheet

## 2013-01-21 NOTE — Anesthesia Preprocedure Evaluation (Addendum)
Anesthesia Evaluation  Patient identified by MRN, date of birth, ID band Patient awake    Reviewed: Allergy & Precautions, H&P , NPO status , Patient's Chart, lab work & pertinent test results, reviewed documented beta blocker date and time   History of Anesthesia Complications (+) PONV and history of anesthetic complications  Airway Mallampati: III TM Distance: >3 FB Neck ROM: Full    Dental  (+) Teeth Intact and Dental Advisory Given   Pulmonary  breath sounds clear to auscultation        Cardiovascular hypertension, Pt. on medications and Pt. on home beta blockers + dysrhythmias Rhythm:Regular Rate:Normal     Neuro/Psych  Headaches,  Neuromuscular disease    GI/Hepatic Neg liver ROS,   Endo/Other  Morbid obesity  Renal/GU negative Renal ROS     Musculoskeletal  (+) Fibromyalgia -  Abdominal (+) + obese,   Peds  Hematology   Anesthesia Other Findings   Reproductive/Obstetrics negative OB ROS                          Anesthesia Physical Anesthesia Plan  ASA: II  Anesthesia Plan: General   Post-op Pain Management:    Induction: Intravenous  Airway Management Planned:   Additional Equipment:   Intra-op Plan:   Post-operative Plan: Extubation in OR  Informed Consent: I have reviewed the patients History and Physical, chart, labs and discussed the procedure including the risks, benefits and alternatives for the proposed anesthesia with the patient or authorized representative who has indicated his/her understanding and acceptance.   Dental advisory given  Plan Discussed with: CRNA and Anesthesiologist  Anesthesia Plan Comments: (HNP L4-5 L5-S1 HNP C5-6 C6-7 HNT Migraines Anxiety  Plan GA with oral ETT  Kipp Brood, MD)        Anesthesia Quick Evaluation

## 2013-01-21 NOTE — Preoperative (Signed)
Beta Blockers   Reason not to administer Beta Blockers:Not Applicable 

## 2013-01-21 NOTE — Plan of Care (Signed)
Problem: Consults Goal: Diagnosis - Spinal Surgery Outcome: Completed/Met Date Met:  01/21/13 Microdiscectomy and Anterior Cervical Fusion

## 2013-01-21 NOTE — Transfer of Care (Signed)
Immediate Anesthesia Transfer of Care Note  Patient: Ashley [name]  Procedure(s) Performed: Procedure(s) with comments: Cervical Five-Six Cervical Six-Seven Anterior Cervical Decompression and Fusion with Plating and Bonegraft-Second Procedure (N/A) - Cervical Five-Six Cervical Six-Seven Anterior Cervical Decompression and Fusion with Plating and Bonegraft-Second Procedure Left Lumbar Four-Five Microdiscectomy- First Procedure (Left) - Left Lumbar Four-Five Microdiscectomy- First Procedure  Patient Location: PACU  Anesthesia Type:General  Level of Consciousness: awake, alert , oriented and patient cooperative  Airway & Oxygen Therapy: Patient Spontanous Breathing and Patient connected to nasal cannula oxygen  Post-op Assessment: Report given to PACU RN and Post -op Vital signs reviewed and stable  Post vital signs: Reviewed and stable  Complications: No apparent anesthesia complications

## 2013-01-21 NOTE — Anesthesia Postprocedure Evaluation (Signed)
  Anesthesia Post-op Note  Patient: Ashley [name]  Procedure(s) Performed: Procedure(s) with comments: Cervical Five-Six Cervical Six-Seven Anterior Cervical Decompression and Fusion with Plating and Bonegraft-Second Procedure (N/A) - Cervical Five-Six Cervical Six-Seven Anterior Cervical Decompression and Fusion with Plating and Bonegraft-Second Procedure Left Lumbar Four-Five Microdiscectomy- First Procedure (Left) - Left Lumbar Four-Five Microdiscectomy- First Procedure  Patient Location: PACU  Anesthesia Type:General  Level of Consciousness: awake, alert  and oriented  Airway and Oxygen Therapy: Patient Spontanous Breathing and Patient connected to nasal cannula oxygen  Post-op Pain: mild  Post-op Assessment: Post-op Vital signs reviewed, Patient's Cardiovascular Status Stable, Respiratory Function Stable, Patent Airway and Pain level controlled  Post-op Vital Signs: stable  Complications: No apparent anesthesia complications

## 2013-01-21 NOTE — OR Nursing (Signed)
Sacral Dressing placed before the supine positioning for the ACDF procedure. Procedure 1 ended at 1214.

## 2013-01-21 NOTE — Progress Notes (Signed)
Pt. c/o headache, light down low, given ice pack to forehead

## 2013-01-22 MED ORDER — OXYCODONE-ACETAMINOPHEN 5-325 MG PO TABS: 1.0000 | ORAL_TABLET | Freq: Four times a day (QID) | ORAL | Status: DC | PRN

## 2013-01-22 MED ORDER — CYCLOBENZAPRINE HCL 10 MG PO TABS: 10.0000 mg | ORAL_TABLET | Freq: Three times a day (TID) | ORAL | Status: DC | PRN

## 2013-01-22 NOTE — Progress Notes (Signed)
Pt. discharged home accompanied by husband. Prescriptions and discharge instructions given with verbalization of understanding. Incision site on neck with no s/s of infection - no swelling, redness, bleeding, and/or drainage noted. Soft collar intact. Pain med given just before leaving. Opportunity given to ask questions but no question asked. Pt. transported out of this unit in wheelchair by the nurse tech  

## 2013-01-22 NOTE — Discharge Summary (Signed)
Physician Discharge Summary  Patient ID: Ashley [name] MRN: [id number] DOB/AGE: [birth year] 47 y.o.  Admit date: 01/21/2013 Discharge date: 01/22/2013  Admission Diagnosescervical herniated disc5/6,6/7   lumbar herniated disc L 4/5 :  Discharge Diagnoses:  Active Problems:   HNP (herniated nucleus pulposus), cervical   Discharged Condition: good  Hospital Course: [name] was admitted to the hospital for an uncomplicated lumbar discetomy, and anterior cervical decompression. Post op she reports numbness in the right thumb but normal strength. The left lower extremity feels better. Both wounds are clean, dry, and without signs of infection. She is voiding, ambulating, and tolerating a regular diet.   Consults: None  Significant Diagnostic Studies: none  Treatments: surgery: Cervical Five-Six Cervical Six-Seven Anterior Cervical Decompression and Fusion with Plating and Bonegraft-Second Procedure Left Lumbar Four-Five Microdiscectomy- First Procedure   Discharge Exam: Blood pressure 111/74, pulse 110, temperature 97.6 F (36.4 C), temperature source Oral, resp. rate 16, SpO2 93.00%. General appearance: alert, cooperative and appears stated age Neurologic: Alert and oriented X 3, normal strength and tone. Normal symmetric reflexes. Normal coordination and gait  Disposition: 01-Home or Self Care     Medication List         DULoxetine 60 MG capsule  Commonly known as:  CYMBALTA  Take 60 mg by mouth 2 (two) times daily.     cyclobenzaprine 10 MG tablet  Commonly known as:  FLEXERIL  Take 1 tablet (10 mg total) by mouth 3 (three) times daily as needed for muscle spasms.     FLEXERIL 10 MG tablet  Generic drug:  cyclobenzaprine  Take 10 mg by mouth 3 (three) times daily as needed.     gabapentin 800 MG tablet  Commonly known as:  NEURONTIN  Take 800 mg by mouth 3 (three) times daily.     HYDROcodone-acetaminophen 5-325 MG per tablet  Commonly known as:   NORCO/VICODIN  Take 1 tablet by mouth every 6 (six) hours as needed. For pain     oxyCODONE-acetaminophen 5-325 MG per tablet  Commonly known as:  PERCOCET/ROXICET  Take 1-2 tablets by mouth every 6 (six) hours as needed for moderate pain.     pantoprazole 40 MG tablet  Commonly known as:  PROTONIX  Take 40 mg by mouth daily.     promethazine 25 MG tablet  Commonly known as:  PHENERGAN  Take 25 mg by mouth every 6 (six) hours as needed for nausea.     propranolol ER 80 MG 24 hr capsule  Commonly known as:  INDERAL LA  Take 80 mg by mouth daily.           Follow-up Information   Follow up with , L, MD In 3 weeks. (call office to make an appointment)    Specialty:  Neurosurgery   Contact information:   [address]                         [address] [place] [address] [phone]       Signed: , L 01/22/2013, 8:51 AM

## 2013-01-24 ENCOUNTER — Encounter (HOSPITAL_COMMUNITY): Payer: Self-pay | Admitting: Neurosurgery

## 2013-01-27 DIAGNOSIS — Z9889 Other specified postprocedural states: Secondary | ICD-10-CM

## 2013-01-27 HISTORY — DX: Other specified postprocedural states: Z98.890

## 2013-01-27 HISTORY — PX: SHOULDER SURGERY: SHX246

## 2013-02-02 ENCOUNTER — Encounter: Payer: Self-pay | Admitting: Neurology

## 2013-02-02 ENCOUNTER — Telehealth: Payer: Self-pay | Admitting: Neurology

## 2013-02-02 ENCOUNTER — Encounter (INDEPENDENT_AMBULATORY_CARE_PROVIDER_SITE_OTHER): Payer: Self-pay

## 2013-02-02 ENCOUNTER — Ambulatory Visit (INDEPENDENT_AMBULATORY_CARE_PROVIDER_SITE_OTHER): Payer: Medicare Other | Admitting: Neurology

## 2013-02-02 VITALS — BP 118/80 | HR 68 | Wt 181.5 lb

## 2013-02-02 DIAGNOSIS — M545 Low back pain, unspecified: Secondary | ICD-10-CM | POA: Diagnosis not present

## 2013-02-02 DIAGNOSIS — IMO0001 Reserved for inherently not codable concepts without codable children: Secondary | ICD-10-CM | POA: Diagnosis not present

## 2013-02-02 DIAGNOSIS — IMO0002 Reserved for concepts with insufficient information to code with codable children: Secondary | ICD-10-CM | POA: Diagnosis not present

## 2013-02-02 DIAGNOSIS — G253 Myoclonus: Secondary | ICD-10-CM | POA: Diagnosis not present

## 2013-02-02 DIAGNOSIS — F411 Generalized anxiety disorder: Secondary | ICD-10-CM | POA: Diagnosis not present

## 2013-02-02 DIAGNOSIS — G544 Lumbosacral root disorders, not elsewhere classified: Secondary | ICD-10-CM

## 2013-02-02 DIAGNOSIS — M79609 Pain in unspecified limb: Secondary | ICD-10-CM

## 2013-02-02 DIAGNOSIS — R32 Unspecified urinary incontinence: Secondary | ICD-10-CM | POA: Diagnosis not present

## 2013-02-02 DIAGNOSIS — S139XXA Sprain of joints and ligaments of unspecified parts of neck, initial encounter: Secondary | ICD-10-CM | POA: Diagnosis not present

## 2013-02-02 DIAGNOSIS — G619 Inflammatory polyneuropathy, unspecified: Secondary | ICD-10-CM | POA: Diagnosis not present

## 2013-02-02 DIAGNOSIS — I10 Essential (primary) hypertension: Secondary | ICD-10-CM | POA: Diagnosis not present

## 2013-02-02 HISTORY — DX: Lumbosacral root disorders, not elsewhere classified: G54.4
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
## 2013-02-03 DIAGNOSIS — N83209 Unspecified ovarian cyst, unspecified side: Secondary | ICD-10-CM | POA: Diagnosis not present

## 2013-02-10 ENCOUNTER — Ambulatory Visit (INDEPENDENT_AMBULATORY_CARE_PROVIDER_SITE_OTHER): Payer: Medicare Other | Admitting: Radiology

## 2013-02-10 DIAGNOSIS — IMO0002 Reserved for concepts with insufficient information to code with codable children: Secondary | ICD-10-CM | POA: Diagnosis not present

## 2013-02-10 DIAGNOSIS — M47812 Spondylosis without myelopathy or radiculopathy, cervical region: Secondary | ICD-10-CM | POA: Diagnosis not present

## 2013-02-10 DIAGNOSIS — G253 Myoclonus: Secondary | ICD-10-CM | POA: Diagnosis not present

## 2013-02-10 DIAGNOSIS — M79609 Pain in unspecified limb: Secondary | ICD-10-CM

## 2013-02-10 DIAGNOSIS — G544 Lumbosacral root disorders, not elsewhere classified: Secondary | ICD-10-CM

## 2013-02-10 DIAGNOSIS — M5126 Other intervertebral disc displacement, lumbar region: Secondary | ICD-10-CM | POA: Diagnosis not present

## 2013-02-10 DIAGNOSIS — M542 Cervicalgia: Secondary | ICD-10-CM | POA: Diagnosis not present

## 2013-02-10 NOTE — Procedures (Signed)
    History:  Ashley [name] is a 48 year old patient with a history of episodes of left greater than right-sided jerking and stiffening, unassociated with loss of consciousness. The last episode occurred on 11/05/2012. The patient is being evaluated for possible seizure-type events.  This is a routine EEG. No skull defects are noted. Medications include alprazolam, Flexeril, Cymbalta, gabapentin, hydrocodone, oxycodone, Protonix, promethazine, and propranolol.   EEG classification: Normal awake  Description of the recording: The background rhythms of this recording consists of a fairly well modulated medium amplitude alpha rhythm of 10 Hz that is reactive to eye opening and closure. As the record progresses, the patient appears to remain in the waking state throughout the recording. Photic stimulation was performed, resulting in a bilateral and symmetric photic driving response. Hyperventilation was also performed, resulting in a minimal buildup of the background rhythm activities without significant slowing seen. During the recording, overlying beta frequency activity was seen throughout. At no time during the recording does there appear to be evidence of spike or spike wave discharges or evidence of focal slowing. EKG monitor shows no evidence of cardiac rhythm abnormalities with a heart rate of 72.  Impression: This is a normal EEG recording in the waking state. No evidence of ictal or interictal discharges are seen. The overlying beta activity is likely secondary to medication effect such as from alprazolam.

## 2013-03-01 ENCOUNTER — Inpatient Hospital Stay: Admission: RE | Admit: 2013-03-01 | Payer: Medicaid Other | Source: Ambulatory Visit

## 2013-03-09 DIAGNOSIS — S139XXA Sprain of joints and ligaments of unspecified parts of neck, initial encounter: Secondary | ICD-10-CM | POA: Diagnosis not present

## 2013-03-09 DIAGNOSIS — IMO0001 Reserved for inherently not codable concepts without codable children: Secondary | ICD-10-CM | POA: Diagnosis not present

## 2013-03-09 DIAGNOSIS — M75110 Incomplete rotator cuff tear or rupture of unspecified shoulder, not specified as traumatic: Secondary | ICD-10-CM | POA: Diagnosis not present

## 2013-03-09 DIAGNOSIS — S239XXA Sprain of unspecified parts of thorax, initial encounter: Secondary | ICD-10-CM | POA: Diagnosis not present

## 2013-03-09 DIAGNOSIS — G619 Inflammatory polyneuropathy, unspecified: Secondary | ICD-10-CM | POA: Diagnosis not present

## 2013-03-09 DIAGNOSIS — S335XXA Sprain of ligaments of lumbar spine, initial encounter: Secondary | ICD-10-CM | POA: Diagnosis not present

## 2013-03-15 DIAGNOSIS — M25519 Pain in unspecified shoulder: Secondary | ICD-10-CM | POA: Diagnosis not present

## 2013-03-15 DIAGNOSIS — S43429A Sprain of unspecified rotator cuff capsule, initial encounter: Secondary | ICD-10-CM | POA: Diagnosis not present

## 2013-03-31 DIAGNOSIS — K2960 Other gastritis without bleeding: Secondary | ICD-10-CM | POA: Diagnosis not present

## 2013-04-06 DIAGNOSIS — M79609 Pain in unspecified limb: Secondary | ICD-10-CM | POA: Diagnosis not present

## 2013-04-06 DIAGNOSIS — M5126 Other intervertebral disc displacement, lumbar region: Secondary | ICD-10-CM | POA: Diagnosis not present

## 2013-04-06 DIAGNOSIS — M6281 Muscle weakness (generalized): Secondary | ICD-10-CM | POA: Diagnosis not present

## 2013-04-07 ENCOUNTER — Telehealth: Payer: Self-pay | Admitting: *Deleted

## 2013-04-07 ENCOUNTER — Telehealth: Payer: Self-pay | Admitting: Neurology

## 2013-04-07 DIAGNOSIS — R32 Unspecified urinary incontinence: Secondary | ICD-10-CM

## 2013-04-07 DIAGNOSIS — M545 Low back pain, unspecified: Secondary | ICD-10-CM

## 2013-04-07 NOTE — Telephone Encounter (Signed)
Called patient and left VM message for more information concerning problems of incontinience

## 2013-04-07 NOTE — Telephone Encounter (Signed)
Patient returning Aimee's call.

## 2013-04-11 DIAGNOSIS — M5126 Other intervertebral disc displacement, lumbar region: Secondary | ICD-10-CM | POA: Diagnosis not present

## 2013-04-11 DIAGNOSIS — M79609 Pain in unspecified limb: Secondary | ICD-10-CM | POA: Diagnosis not present

## 2013-04-11 DIAGNOSIS — M6281 Muscle weakness (generalized): Secondary | ICD-10-CM | POA: Diagnosis not present

## 2013-04-11 NOTE — Telephone Encounter (Signed)
I called patient. The patient is having a one-month history of increased back pain, and within the last 1 week, the patient has had some urinary incontinence and pain in the left and right lower extremities. I'll check another MRI of the low back, and a urinalysis.

## 2013-04-11 NOTE — Telephone Encounter (Signed)
Spoke with patient  having incontinience for five times( usually while in bed, once while standing up brushing teeth). Is having left leg and foot pain. She had  PT this morning which helped a great deal with the pain.

## 2013-04-11 NOTE — Telephone Encounter (Signed)
Please refer to prior telephone note from today. MRI of the lumbosacral spine will be ordered, and a urinalysis.

## 2013-04-11 NOTE — Telephone Encounter (Signed)
Have tried to call patient concerning problems with incontinience/back pains, have not been able to reach patient, left VM message.

## 2013-04-13 ENCOUNTER — Encounter: Payer: Self-pay | Admitting: Neurology

## 2013-04-13 ENCOUNTER — Ambulatory Visit (INDEPENDENT_AMBULATORY_CARE_PROVIDER_SITE_OTHER): Payer: Medicare Other | Admitting: Neurology

## 2013-04-13 ENCOUNTER — Encounter (INDEPENDENT_AMBULATORY_CARE_PROVIDER_SITE_OTHER): Payer: Self-pay

## 2013-04-13 VITALS — BP 149/94 | HR 97 | Wt 182.0 lb

## 2013-04-13 DIAGNOSIS — M79609 Pain in unspecified limb: Secondary | ICD-10-CM

## 2013-04-13 DIAGNOSIS — M545 Low back pain, unspecified: Secondary | ICD-10-CM | POA: Diagnosis not present

## 2013-04-13 DIAGNOSIS — M6281 Muscle weakness (generalized): Secondary | ICD-10-CM | POA: Diagnosis not present

## 2013-04-13 DIAGNOSIS — M5126 Other intervertebral disc displacement, lumbar region: Secondary | ICD-10-CM | POA: Diagnosis not present

## 2013-04-13 MED ORDER — PREDNISONE 5 MG PO TABS: ORAL_TABLET | ORAL | Status: DC

## 2013-04-13 NOTE — Patient Instructions (Signed)
Back Pain, Adult Low back pain is very common. About 1 in 5 people have back pain.The cause of low back pain is rarely dangerous. The pain often gets better over time.About half of people with a sudden onset of back pain feel better in just 2 weeks. About 8 in 10 people feel better by 6 weeks.  CAUSES Some common causes of back pain include:  Strain of the muscles or ligaments supporting the spine.  Wear and tear (degeneration) of the spinal discs.  Arthritis.  Direct injury to the back. DIAGNOSIS Most of the time, the direct cause of low back pain is not known.However, back pain can be treated effectively even when the exact cause of the pain is unknown.Answering your caregiver's questions about your overall health and symptoms is one of the most accurate ways to make sure the cause of your pain is not dangerous. If your caregiver needs more information, he or she may order lab work or imaging tests (X-rays or MRIs).However, even if imaging tests show changes in your back, this usually does not require surgery. HOME CARE INSTRUCTIONS For many people, back pain returns.Since low back pain is rarely dangerous, it is often a condition that people can learn to manageon their own.   Remain active. It is stressful on the back to sit or stand in one place. Do not sit, drive, or stand in one place for more than 30 minutes at a time. Take short walks on level surfaces as soon as pain allows.Try to increase the length of time you walk each day.  Do not stay in bed.Resting more than 1 or 2 days can delay your recovery.  Do not avoid exercise or work.Your body is made to move.It is not dangerous to be active, even though your back may hurt.Your back will likely heal faster if you return to being active before your pain is gone.  Pay attention to your body when you bend and lift. Many people have less discomfortwhen lifting if they bend their knees, keep the load close to their bodies,and  avoid twisting. Often, the most comfortable positions are those that put less stress on your recovering back.  Find a comfortable position to sleep. Use a firm mattress and lie on your side with your knees slightly bent. If you lie on your back, put a pillow under your knees.  Only take over-the-counter or prescription medicines as directed by your caregiver. Over-the-counter medicines to reduce pain and inflammation are often the most helpful.Your caregiver may prescribe muscle relaxant drugs.These medicines help dull your pain so you can more quickly return to your normal activities and healthy exercise.  Put ice on the injured area.  Put ice in a plastic bag.  Place a towel between your skin and the bag.  Leave the ice on for 15-20 minutes, 03-04 times a day for the first 2 to 3 days. After that, ice and heat may be alternated to reduce pain and spasms.  Ask your caregiver about trying back exercises and gentle massage. This may be of some benefit.  Avoid feeling anxious or stressed.Stress increases muscle tension and can worsen back pain.It is important to recognize when you are anxious or stressed and learn ways to manage it.Exercise is a great option. SEEK MEDICAL CARE IF:  You have pain that is not relieved with rest or medicine.  You have pain that does not improve in 1 week.  You have new symptoms.  You are generally not feeling well. SEEK   IMMEDIATE MEDICAL CARE IF:   You have pain that radiates from your back into your legs.  You develop new bowel or bladder control problems.  You have unusual weakness or numbness in your arms or legs.  You develop nausea or vomiting.  You develop abdominal pain.  You feel faint. Document Released: 01/13/2005 Document Revised: 07/15/2011 Document Reviewed: 06/03/2010 Exit[employer] Patient Information 2014 [employer].  
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
## 2013-04-19 DIAGNOSIS — M79609 Pain in unspecified limb: Secondary | ICD-10-CM | POA: Diagnosis not present

## 2013-04-19 DIAGNOSIS — M6281 Muscle weakness (generalized): Secondary | ICD-10-CM | POA: Diagnosis not present

## 2013-04-19 DIAGNOSIS — M5126 Other intervertebral disc displacement, lumbar region: Secondary | ICD-10-CM | POA: Diagnosis not present

## 2013-04-19 DIAGNOSIS — M25519 Pain in unspecified shoulder: Secondary | ICD-10-CM | POA: Diagnosis not present

## 2013-04-20 ENCOUNTER — Ambulatory Visit
Admission: RE | Admit: 2013-04-20 | Discharge: 2013-04-20 | Disposition: A | Discharge status: Home / Self Care | Payer: Medicare Other | Source: Ambulatory Visit | Attending: Neurology | Admitting: Neurology

## 2013-04-20 DIAGNOSIS — M545 Low back pain, unspecified: Secondary | ICD-10-CM

## 2013-04-20 DIAGNOSIS — R32 Unspecified urinary incontinence: Secondary | ICD-10-CM

## 2013-04-20 MED ORDER — GADOBENATE DIMEGLUMINE 529 MG/ML IV SOLN
17.0000 mL | Freq: Once | INTRAVENOUS | Status: AC | PRN
  Administered 2013-04-20: 17 mL via INTRAVENOUS

## 2013-04-21 DIAGNOSIS — M5126 Other intervertebral disc displacement, lumbar region: Secondary | ICD-10-CM | POA: Diagnosis not present

## 2013-04-21 DIAGNOSIS — M6281 Muscle weakness (generalized): Secondary | ICD-10-CM | POA: Diagnosis not present

## 2013-04-21 DIAGNOSIS — M79609 Pain in unspecified limb: Secondary | ICD-10-CM | POA: Diagnosis not present

## 2013-04-22 ENCOUNTER — Telehealth: Payer: Self-pay | Admitting: Neurology

## 2013-04-22 NOTE — Telephone Encounter (Signed)
I called patient. Lumbosacral spine does show postsurgical changes, no obvious areas of severe spinal stenosis or anything that would create urinary incontinence. The patient is to let me know it she is having ongoing issues.   MRI lumbosacral spine 04/21/2013:  Impression   Abnormal MRI lumbar spine (without) demonstrating: 1. At L5-S1: small left posterior lateral disc bulging with posterior  laminectomy changes, facet hypertrophy with no spinal stenosis or  foraminal narrowing; left S1 root is displaced posteriorly against the  left lateral recess. 2. L4-5: disc bulging, short pedicles, facet hypertrophy with mild  biforaminal foraminal stenosis. 3. Compared to MRI on 05/04/08, there has been surgical discectomy at L5-S1.  Otherwise no new findings.

## 2013-04-27 DIAGNOSIS — M24119 Other articular cartilage disorders, unspecified shoulder: Secondary | ICD-10-CM | POA: Diagnosis not present

## 2013-04-27 DIAGNOSIS — M25819 Other specified joint disorders, unspecified shoulder: Secondary | ICD-10-CM | POA: Diagnosis not present

## 2013-04-27 DIAGNOSIS — M19019 Primary osteoarthritis, unspecified shoulder: Secondary | ICD-10-CM | POA: Diagnosis not present

## 2013-04-27 DIAGNOSIS — M75100 Unspecified rotator cuff tear or rupture of unspecified shoulder, not specified as traumatic: Secondary | ICD-10-CM | POA: Diagnosis not present

## 2013-04-27 DIAGNOSIS — M75110 Incomplete rotator cuff tear or rupture of unspecified shoulder, not specified as traumatic: Secondary | ICD-10-CM | POA: Diagnosis not present

## 2013-04-27 DIAGNOSIS — IMO0002 Reserved for concepts with insufficient information to code with codable children: Secondary | ICD-10-CM | POA: Diagnosis not present

## 2013-04-27 DIAGNOSIS — G8918 Other acute postprocedural pain: Secondary | ICD-10-CM | POA: Diagnosis not present

## 2013-05-02 DIAGNOSIS — M75110 Incomplete rotator cuff tear or rupture of unspecified shoulder, not specified as traumatic: Secondary | ICD-10-CM | POA: Diagnosis not present

## 2013-05-02 DIAGNOSIS — M79609 Pain in unspecified limb: Secondary | ICD-10-CM | POA: Diagnosis not present

## 2013-05-02 DIAGNOSIS — M6281 Muscle weakness (generalized): Secondary | ICD-10-CM | POA: Diagnosis not present

## 2013-05-04 DIAGNOSIS — M79609 Pain in unspecified limb: Secondary | ICD-10-CM | POA: Diagnosis not present

## 2013-05-04 DIAGNOSIS — M75110 Incomplete rotator cuff tear or rupture of unspecified shoulder, not specified as traumatic: Secondary | ICD-10-CM | POA: Diagnosis not present

## 2013-05-04 DIAGNOSIS — M6281 Muscle weakness (generalized): Secondary | ICD-10-CM | POA: Diagnosis not present

## 2013-05-09 DIAGNOSIS — E669 Obesity, unspecified: Secondary | ICD-10-CM | POA: Diagnosis not present

## 2013-05-10 DIAGNOSIS — M6281 Muscle weakness (generalized): Secondary | ICD-10-CM | POA: Diagnosis not present

## 2013-05-10 DIAGNOSIS — M79609 Pain in unspecified limb: Secondary | ICD-10-CM | POA: Diagnosis not present

## 2013-05-10 DIAGNOSIS — M75110 Incomplete rotator cuff tear or rupture of unspecified shoulder, not specified as traumatic: Secondary | ICD-10-CM | POA: Diagnosis not present

## 2013-05-12 DIAGNOSIS — M79609 Pain in unspecified limb: Secondary | ICD-10-CM | POA: Diagnosis not present

## 2013-05-12 DIAGNOSIS — M75110 Incomplete rotator cuff tear or rupture of unspecified shoulder, not specified as traumatic: Secondary | ICD-10-CM | POA: Diagnosis not present

## 2013-05-12 DIAGNOSIS — M6281 Muscle weakness (generalized): Secondary | ICD-10-CM | POA: Diagnosis not present

## 2013-05-19 DIAGNOSIS — M6281 Muscle weakness (generalized): Secondary | ICD-10-CM | POA: Diagnosis not present

## 2013-05-19 DIAGNOSIS — M79609 Pain in unspecified limb: Secondary | ICD-10-CM | POA: Diagnosis not present

## 2013-05-19 DIAGNOSIS — M75110 Incomplete rotator cuff tear or rupture of unspecified shoulder, not specified as traumatic: Secondary | ICD-10-CM | POA: Diagnosis not present

## 2013-05-23 ENCOUNTER — Telehealth: Payer: Self-pay | Admitting: *Deleted

## 2013-05-23 DIAGNOSIS — M545 Low back pain, unspecified: Secondary | ICD-10-CM

## 2013-05-23 NOTE — Telephone Encounter (Signed)
Pt is calling stating that she is having back pain rotating to her hip and leg. Pt would like to know that next step. Please advise

## 2013-05-23 NOTE — Telephone Encounter (Signed)
I called the patient. The patient has ongoing pain in the low back, going into the hip and upper thigh. MRI of the low back did not show severe spinal stenosis, some displacement of the left S1 nerve root was noted, not the right. We will try an epidural steroid injection. In the past, a TENS unit has helped her, we will write a prescription for this.

## 2013-05-24 ENCOUNTER — Encounter: Payer: Self-pay | Admitting: *Deleted

## 2013-05-24 DIAGNOSIS — M76899 Other specified enthesopathies of unspecified lower limb, excluding foot: Secondary | ICD-10-CM | POA: Diagnosis not present

## 2013-05-24 DIAGNOSIS — M75110 Incomplete rotator cuff tear or rupture of unspecified shoulder, not specified as traumatic: Secondary | ICD-10-CM | POA: Diagnosis not present

## 2013-05-24 DIAGNOSIS — M6281 Muscle weakness (generalized): Secondary | ICD-10-CM | POA: Diagnosis not present

## 2013-05-24 DIAGNOSIS — M79609 Pain in unspecified limb: Secondary | ICD-10-CM | POA: Diagnosis not present

## 2013-05-24 NOTE — Progress Notes (Signed)
Weott faxed to [place] stating needing ofv note related to TENS unit.   DONE [phone].

## 2013-05-24 NOTE — Progress Notes (Deleted)
Subjective:    Patient ID: Ashley [name] is a 48 y.o. female.  HPI {Common ambulatory SmartLinks:19316}  Review of Systems  Objective:  Neurologic Exam  Physical Exam  Assessment:   ***  Plan:   ***

## 2013-05-31 ENCOUNTER — Telehealth: Payer: Self-pay | Admitting: Neurology

## 2013-05-31 NOTE — Telephone Encounter (Signed)
This is for a TENS unit.  It appears Lovey Newcomer took care of this on 04/28.

## 2013-05-31 NOTE — Telephone Encounter (Signed)
Pharmacy is calling requesting for the Medical Necessity form faxed over to them at [phone] as soon as possible so that the pt can get this due to she is in a lot of pain per pharmacy. This request was sent to [place] on 05/24/13 please advise. Thanks

## 2013-06-01 NOTE — Telephone Encounter (Signed)
Order for TENS unit was faxed today to [facility] at [phone] and confirmation received.

## 2013-06-02 ENCOUNTER — Other Ambulatory Visit: Payer: Self-pay | Admitting: Orthopedic Surgery

## 2013-06-02 DIAGNOSIS — M25559 Pain in unspecified hip: Secondary | ICD-10-CM

## 2013-06-02 DIAGNOSIS — I10 Essential (primary) hypertension: Secondary | ICD-10-CM | POA: Diagnosis not present

## 2013-06-02 DIAGNOSIS — M47812 Spondylosis without myelopathy or radiculopathy, cervical region: Secondary | ICD-10-CM | POA: Diagnosis not present

## 2013-06-02 DIAGNOSIS — Z6831 Body mass index (BMI) 31.0-31.9, adult: Secondary | ICD-10-CM | POA: Diagnosis not present

## 2013-06-02 DIAGNOSIS — M5126 Other intervertebral disc displacement, lumbar region: Secondary | ICD-10-CM | POA: Diagnosis not present

## 2013-06-02 DIAGNOSIS — Z4789 Encounter for other orthopedic aftercare: Secondary | ICD-10-CM | POA: Diagnosis not present

## 2013-06-05 ENCOUNTER — Ambulatory Visit
Admission: RE | Admit: 2013-06-05 | Discharge: 2013-06-05 | Disposition: A | Discharge status: Home / Self Care | Payer: Medicare Other | Source: Ambulatory Visit | Attending: Orthopedic Surgery | Admitting: Orthopedic Surgery

## 2013-06-05 DIAGNOSIS — M79609 Pain in unspecified limb: Secondary | ICD-10-CM | POA: Diagnosis not present

## 2013-06-05 DIAGNOSIS — M25559 Pain in unspecified hip: Secondary | ICD-10-CM

## 2013-07-07 DIAGNOSIS — M545 Low back pain, unspecified: Secondary | ICD-10-CM | POA: Diagnosis not present

## 2013-07-07 DIAGNOSIS — IMO0002 Reserved for concepts with insufficient information to code with codable children: Secondary | ICD-10-CM | POA: Diagnosis not present

## 2013-07-07 DIAGNOSIS — M25559 Pain in unspecified hip: Secondary | ICD-10-CM | POA: Diagnosis not present

## 2013-07-07 DIAGNOSIS — M961 Postlaminectomy syndrome, not elsewhere classified: Secondary | ICD-10-CM | POA: Diagnosis not present

## 2013-07-08 DIAGNOSIS — M545 Low back pain, unspecified: Secondary | ICD-10-CM | POA: Diagnosis not present

## 2013-07-08 DIAGNOSIS — M961 Postlaminectomy syndrome, not elsewhere classified: Secondary | ICD-10-CM | POA: Diagnosis not present

## 2013-07-08 DIAGNOSIS — M25559 Pain in unspecified hip: Secondary | ICD-10-CM | POA: Diagnosis not present

## 2013-07-08 DIAGNOSIS — IMO0002 Reserved for concepts with insufficient information to code with codable children: Secondary | ICD-10-CM | POA: Diagnosis not present

## 2013-07-20 DIAGNOSIS — S239XXA Sprain of unspecified parts of thorax, initial encounter: Secondary | ICD-10-CM | POA: Diagnosis not present

## 2013-07-20 DIAGNOSIS — S335XXA Sprain of ligaments of lumbar spine, initial encounter: Secondary | ICD-10-CM | POA: Diagnosis not present

## 2013-07-20 DIAGNOSIS — R238 Other skin changes: Secondary | ICD-10-CM | POA: Diagnosis not present

## 2013-07-20 DIAGNOSIS — G619 Inflammatory polyneuropathy, unspecified: Secondary | ICD-10-CM | POA: Diagnosis not present

## 2013-07-20 DIAGNOSIS — K5289 Other specified noninfective gastroenteritis and colitis: Secondary | ICD-10-CM | POA: Diagnosis not present

## 2013-07-20 DIAGNOSIS — IMO0001 Reserved for inherently not codable concepts without codable children: Secondary | ICD-10-CM | POA: Diagnosis not present

## 2013-07-20 DIAGNOSIS — S139XXA Sprain of joints and ligaments of unspecified parts of neck, initial encounter: Secondary | ICD-10-CM | POA: Diagnosis not present

## 2013-07-21 ENCOUNTER — Telehealth: Payer: Self-pay | Admitting: Neurology

## 2013-07-21 DIAGNOSIS — M545 Low back pain, unspecified: Secondary | ICD-10-CM

## 2013-07-21 NOTE — Telephone Encounter (Signed)
Patient stated still experiencing pain after using TENS unit.  Had cortisone shots that lasted for only one week.  Please call and advise.

## 2013-07-22 NOTE — Telephone Encounter (Signed)
I called patient. The patient is still having some low back pain. She has tried a TENS unit with some benefit, but the epidural steroid injections have not helped. She is having back pain, and some right leg pain down to the knee. MRI study shows possible impingement of the left S1 nerve root. I will check EMG and nerve conduction studies. The patient will have EMG on the right leg. She had an injection by her orthopedic surgeon into the right hip which helped for about 1 week.

## 2013-07-22 NOTE — Telephone Encounter (Signed)
Please advise previous note. Thanks  °

## 2013-07-28 DIAGNOSIS — M25559 Pain in unspecified hip: Secondary | ICD-10-CM | POA: Diagnosis not present

## 2013-07-28 DIAGNOSIS — M545 Low back pain, unspecified: Secondary | ICD-10-CM | POA: Diagnosis not present

## 2013-07-28 DIAGNOSIS — IMO0002 Reserved for concepts with insufficient information to code with codable children: Secondary | ICD-10-CM | POA: Diagnosis not present

## 2013-07-28 DIAGNOSIS — M961 Postlaminectomy syndrome, not elsewhere classified: Secondary | ICD-10-CM | POA: Diagnosis not present

## 2013-08-03 ENCOUNTER — Ambulatory Visit (INDEPENDENT_AMBULATORY_CARE_PROVIDER_SITE_OTHER): Payer: Medicare Other | Admitting: Neurology

## 2013-08-03 ENCOUNTER — Encounter (INDEPENDENT_AMBULATORY_CARE_PROVIDER_SITE_OTHER): Payer: Self-pay

## 2013-08-03 DIAGNOSIS — M79609 Pain in unspecified limb: Secondary | ICD-10-CM | POA: Diagnosis not present

## 2013-08-03 DIAGNOSIS — M545 Low back pain, unspecified: Secondary | ICD-10-CM

## 2013-08-03 DIAGNOSIS — M79604 Pain in right leg: Secondary | ICD-10-CM

## 2013-08-03 DIAGNOSIS — Z0289 Encounter for other administrative examinations: Secondary | ICD-10-CM
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
## 2013-08-11 DIAGNOSIS — IMO0002 Reserved for concepts with insufficient information to code with codable children: Secondary | ICD-10-CM | POA: Diagnosis not present

## 2013-08-16 ENCOUNTER — Ambulatory Visit: Payer: Self-pay | Admitting: Adult Health

## 2013-08-29 ENCOUNTER — Ambulatory Visit: Payer: Medicare Other | Admitting: Adult Health

## 2013-08-29 ENCOUNTER — Telehealth: Payer: Self-pay | Admitting: Adult Health

## 2013-08-29 NOTE — Telephone Encounter (Signed)
Patient called 30 minutes before her appointment to reschedule.

## 2013-09-05 ENCOUNTER — Encounter: Payer: Self-pay | Admitting: Adult Health

## 2013-09-12 DIAGNOSIS — M961 Postlaminectomy syndrome, not elsewhere classified: Secondary | ICD-10-CM | POA: Diagnosis not present

## 2013-09-12 DIAGNOSIS — IMO0002 Reserved for concepts with insufficient information to code with codable children: Secondary | ICD-10-CM | POA: Diagnosis not present

## 2013-09-12 DIAGNOSIS — M545 Low back pain, unspecified: Secondary | ICD-10-CM | POA: Diagnosis not present

## 2013-09-14 ENCOUNTER — Encounter: Payer: Self-pay | Admitting: Adult Health

## 2013-09-14 ENCOUNTER — Ambulatory Visit (INDEPENDENT_AMBULATORY_CARE_PROVIDER_SITE_OTHER): Payer: Medicare Other | Admitting: Adult Health

## 2013-09-14 VITALS — BP 147/95 | HR 76 | Ht 64.5 in | Wt 188.0 lb

## 2013-09-14 DIAGNOSIS — R0989 Other specified symptoms and signs involving the circulatory and respiratory systems: Secondary | ICD-10-CM

## 2013-09-14 DIAGNOSIS — M545 Low back pain, unspecified: Secondary | ICD-10-CM | POA: Diagnosis not present

## 2013-09-14 DIAGNOSIS — M79609 Pain in unspecified limb: Secondary | ICD-10-CM

## 2013-09-14 DIAGNOSIS — M79604 Pain in right leg: Secondary | ICD-10-CM

## 2013-09-14 DIAGNOSIS — R0609 Other forms of dyspnea: Secondary | ICD-10-CM | POA: Diagnosis not present

## 2013-09-14 DIAGNOSIS — G4710 Hypersomnia, unspecified: Secondary | ICD-10-CM

## 2013-09-14 DIAGNOSIS — G4719 Other hypersomnia: Secondary | ICD-10-CM

## 2013-09-14 DIAGNOSIS — R0683 Snoring: Secondary | ICD-10-CM

## 2013-09-14 MED ORDER — GABAPENTIN 100 MG PO CAPS: 100.0000 mg | ORAL_CAPSULE | Freq: Three times a day (TID) | ORAL | Status: DC
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
## 2013-09-14 NOTE — Patient Instructions (Signed)
Back Pain, Adult °Back pain is very common. The pain often gets better over time. The cause of back pain is usually not dangerous. Most people can learn to manage their back pain on their own.  °HOME CARE  °· Stay active. Start with short walks on flat ground if you can. Try to walk farther each day. °· Do not sit, drive, or stand in one place for more than 30 minutes. Do not stay in bed. °· Do not avoid exercise or work. Activity can help your back heal faster. °· Be careful when you bend or lift an object. Bend at your knees, keep the object close to you, and do not twist. °· Sleep on a firm mattress. Lie on your side, and bend your knees. If you lie on your back, put a pillow under your knees. °· Only take medicines as told by your doctor. °· Put ice on the injured area. °¨ Put ice in a plastic bag. °¨ Place a towel between your skin and the bag. °¨ Leave the ice on for 15-20 minutes, 03-04 times a day for the first 2 to 3 days. After that, you can switch between ice and heat packs. °· Ask your doctor about back exercises or massage. °· Avoid feeling anxious or stressed. Find good ways to deal with stress, such as exercise. °GET HELP RIGHT AWAY IF:  °· Your pain does not go away with rest or medicine. °· Your pain does not go away in 1 week. °· You have new problems. °· You do not feel well. °· The pain spreads into your legs. °· You cannot control when you poop (bowel movement) or pee (urinate). °· Your arms or legs feel weak or lose feeling (numbness). °· You feel sick to your stomach (nauseous) or throw up (vomit). °· You have belly (abdominal) pain. °· You feel like you may pass out (faint). °MAKE SURE YOU:  °· Understand these instructions. °· Will watch your condition. °· Will get help right away if you are not doing well or get worse. °Document Released: 07/02/2007 Document Revised: 04/07/2011 Document Reviewed: 05/17/2013 °Exit[employer]® Patient Information ©2015 [employer]. This information is not intended  to replace advice given to you by your health care provider. Make sure you discuss any questions you have with your health care provider. ° °

## 2013-09-14 NOTE — Progress Notes (Signed)
I have read the note, and I agree with the clinical assessment and plan.  Ashley [name],Ashley [name]   

## 2013-09-16 DIAGNOSIS — M961 Postlaminectomy syndrome, not elsewhere classified: Secondary | ICD-10-CM | POA: Diagnosis not present

## 2013-09-16 DIAGNOSIS — IMO0002 Reserved for concepts with insufficient information to code with codable children: Secondary | ICD-10-CM | POA: Diagnosis not present

## 2013-09-29 ENCOUNTER — Telehealth: Payer: Self-pay | Admitting: Neurology
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
## 2013-10-04 DIAGNOSIS — I10 Essential (primary) hypertension: Secondary | ICD-10-CM | POA: Diagnosis not present

## 2013-10-04 DIAGNOSIS — M5126 Other intervertebral disc displacement, lumbar region: Secondary | ICD-10-CM | POA: Diagnosis not present

## 2013-10-04 DIAGNOSIS — Z6832 Body mass index (BMI) 32.0-32.9, adult: Secondary | ICD-10-CM | POA: Diagnosis not present

## 2013-10-05 ENCOUNTER — Other Ambulatory Visit (HOSPITAL_COMMUNITY): Payer: Self-pay | Admitting: Neurosurgery

## 2013-10-05 ENCOUNTER — Encounter (HOSPITAL_COMMUNITY): Payer: Self-pay | Admitting: Pharmacy Technician

## 2013-10-05 DIAGNOSIS — M5126 Other intervertebral disc displacement, lumbar region: Secondary | ICD-10-CM

## 2013-10-11 ENCOUNTER — Telehealth: Payer: Self-pay | Admitting: Neurology

## 2013-10-11 MED ORDER — PREDNISONE 10 MG PO TABS: ORAL_TABLET | ORAL | Status: DC

## 2013-10-11 NOTE — Telephone Encounter (Signed)
I called patient. The patient is having 3 or 4 days of severe back pain, pain going down the legs. She has oxycodone to take, but this is not helping much. I will try prednisone Dosepak. She will be having a lumbar myelogram next week.

## 2013-10-11 NOTE — Telephone Encounter (Signed)
Patient stated she followed up with Dr. Christella Noa on 09/8 and continue to have excruciating back pain.  Patient stated she can't function and oxyCODONE-acetaminophen (PERCOCET/ROXICET) 5-325 MG per tablet not helping, scheduling for Milogram on 9/23 with Dr. Christella Noa.    Please call and advise.

## 2013-10-13 DIAGNOSIS — Z209 Contact with and (suspected) exposure to unspecified communicable disease: Secondary | ICD-10-CM | POA: Diagnosis not present

## 2013-10-13 DIAGNOSIS — M545 Low back pain, unspecified: Secondary | ICD-10-CM | POA: Diagnosis not present

## 2013-10-13 DIAGNOSIS — Z23 Encounter for immunization: Secondary | ICD-10-CM | POA: Diagnosis not present

## 2013-10-13 DIAGNOSIS — R300 Dysuria: Secondary | ICD-10-CM | POA: Diagnosis not present

## 2013-10-13 DIAGNOSIS — R32 Unspecified urinary incontinence: Secondary | ICD-10-CM | POA: Diagnosis not present

## 2013-10-19 ENCOUNTER — Encounter (HOSPITAL_COMMUNITY): Payer: Self-pay

## 2013-10-19 ENCOUNTER — Ambulatory Visit (HOSPITAL_COMMUNITY)
Admission: RE | Admit: 2013-10-19 | Discharge: 2013-10-19 | Disposition: A | Discharge status: Home / Self Care | Payer: Medicare Other | Source: Ambulatory Visit | Attending: Neurosurgery | Admitting: Neurosurgery

## 2013-10-19 DIAGNOSIS — M5126 Other intervertebral disc displacement, lumbar region: Secondary | ICD-10-CM | POA: Insufficient documentation

## 2013-10-19 DIAGNOSIS — M545 Low back pain, unspecified: Secondary | ICD-10-CM | POA: Diagnosis not present

## 2013-10-19 MED ORDER — DIAZEPAM 5 MG PO TABS
10.0000 mg | ORAL_TABLET | Freq: Once | ORAL | Status: AC
  Administered 2013-10-19: 10 mg via ORAL
  Filled 2013-10-19: qty 2

## 2013-10-19 MED ORDER — IOHEXOL 180 MG/ML  SOLN
20.0000 mL | Freq: Once | INTRAMUSCULAR | Status: AC | PRN
  Administered 2013-10-19: 16 mL via INTRATHECAL

## 2013-10-19 MED ORDER — DIAZEPAM 5 MG PO TABS
ORAL_TABLET | ORAL | Status: AC
  Administered 2013-10-19: 10 mg via ORAL
  Filled 2013-10-19: qty 2

## 2013-10-19 MED ORDER — OXYCODONE HCL 5 MG PO TABS
ORAL_TABLET | ORAL | Status: AC
  Filled 2013-10-19: qty 2

## 2013-10-19 MED ORDER — ONDANSETRON HCL 4 MG/2ML IJ SOLN: 4.0000 mg | Freq: Four times a day (QID) | INTRAMUSCULAR | Status: DC | PRN

## 2013-10-19 MED ORDER — OXYCODONE HCL 5 MG PO TABS
5.0000 mg | ORAL_TABLET | ORAL | Status: DC | PRN
  Administered 2013-10-19: 10 mg via ORAL

## 2013-10-19 NOTE — Op Note (Signed)
10/19/2013 lumbar Myelogram  PATIENT:  Shalita R Mies is a 48 y.o. female with low back and lower extremity pain  PRE-OPERATIVE DIAGNOSIS:  lumbago  POST-OPERATIVE DIAGNOSIS:  lumbago  PROCEDURE:  Lumbar Myelogram  SURGEON:    ANESTHESIA:   local LOCAL MEDICATIONS USED:  LIDOCAINE  and Amount: 6 ml Procedure Note: Tyla R Carreno is a 48 y.o. female Was taken to the fluoroscopy suite and  positioned prone on the fluoroscopy table. her back was prepared and draped in a sterile manner. I infiltrated 6 cc into the lumbar region. I then introduced a spinal needle into the thecal sac at the L3/4 interlaminar space. I infiltrated 20cc of Omnipaque 180 into the thecal sac. Fluoroscopy showed the needle and contrast in the thecal sac. Makia R Kadrmas tolerated the procedure well. she Will be taken to CT for evaluation.     PATIENT DISPOSITION:  PACU - hemodynamically stable.

## 2013-10-19 NOTE — Progress Notes (Signed)
Pt transferred to radiology nurses station d/t increased pain and crying following dye injection w/ myelogram procedure.  Pt given pain meds ordered for pain level 8/10.  Lights dimmed for comfort until CT comes for pt.

## 2013-10-19 NOTE — Discharge Instructions (Signed)
Myelogram and Lumbar Puncture Discharge Instructions ° °1. Go home and rest quietly for the next 24 hours.  It is important to lie flat for the next 24 hours.  Get up only to go to the restroom.  You may lie in the bed or on a couch on your back, your stomach, your left side or your right side.  You may have one pillow under your head.  You may have pillows between your knees while you are on your side or under your knees while you are on your back. ° °2. DO NOT drive today.  Recline the seat as far back as it will go, while still wearing your seat belt, on the way home. ° °3. You may get up to go to the bathroom as needed.  You may sit up for 10 minutes to eat.  You may resume your normal diet and medications unless otherwise indicated. ° °4. The incidence of headache, nausea, or vomiting is about 5% (one in 20 patients).  If you develop a headache, lie flat and drink plenty of fluids until the headache goes away.  Caffeinated beverages may be helpful.  If you develop severe nausea and vomiting or a headache that does not go away with flat bed rest, call Dr Cabbell. ° °5. You may resume normal activities after your 24 hours of bed rest is over; however, do not exert yourself strongly or do any heavy lifting tomorrow. ° °6. Call your physician for a follow-up appointment.  The results of your myelogram will be sent directly to your physician by the following day. ° °7. If you have any questions or if complications develop after you arrive home, please call Dr Cabbell. ° °Discharge instructions have been explained to the patient.  The patient, or the person responsible for the patient, fully understands these instructions. ° ° °

## 2013-10-27 DIAGNOSIS — M5126 Other intervertebral disc displacement, lumbar region: Secondary | ICD-10-CM | POA: Diagnosis not present

## 2013-10-27 DIAGNOSIS — Z6832 Body mass index (BMI) 32.0-32.9, adult: Secondary | ICD-10-CM | POA: Diagnosis not present

## 2013-10-27 DIAGNOSIS — I10 Essential (primary) hypertension: Secondary | ICD-10-CM | POA: Diagnosis not present

## 2013-10-31 ENCOUNTER — Telehealth: Payer: Self-pay | Admitting: Neurology

## 2013-10-31 ENCOUNTER — Telehealth: Payer: Self-pay | Admitting: *Deleted

## 2013-10-31 ENCOUNTER — Other Ambulatory Visit (HOSPITAL_COMMUNITY): Payer: Self-pay | Admitting: Neurosurgery

## 2013-10-31 NOTE — Telephone Encounter (Signed)
Noted  

## 2013-10-31 NOTE — Telephone Encounter (Signed)
Patient calling to state that Dr Christella Noa has ordered for her to have a LP, patient just wanted Dr Jannifer Franklin input on Dr Lacy Duverney order.

## 2013-10-31 NOTE — Telephone Encounter (Signed)
  I called the patient. She is considering surgery. There is a new disc herniation at L4-5 on the LEFT, but her pain is mainly on the right leg. The patient was told that the spine is unstable with movement, however. A surgical fusion may be of benefit if this is the case. Dr. Christella Noa is managing this case.   10/19/13:  CT LUMBAR MYELOGRAM IMPRESSION:  1. Postoperative changes at L4-L5 with new/recurrent left lateral  recess disc extrusion since March. Severe left lateral recess  stenosis and at least mild left L4 foraminal stenosis. Mild overall  spinal stenosis.  2. Multilevel facet arthropathy, moderate or severe at the T11-T12,  L3-L4, L4-L5, and L5-S1 levels.

## 2013-10-31 NOTE — Telephone Encounter (Signed)
Pt calls because she is having back surgery on 10/14 and her sleep study is scheduled for 10/20.  Because the patient will have an extended hospital stay for recovery and no sooner sleep study appointments are available, her sleep study has been canceled.  She has been advised to call our office after her surgery to reschedule at a time after her recovery.

## 2013-11-03 ENCOUNTER — Encounter (HOSPITAL_COMMUNITY): Payer: Self-pay | Admitting: Pharmacy Technician

## 2013-11-04 ENCOUNTER — Encounter (HOSPITAL_COMMUNITY)
Admission: RE | Admit: 2013-11-04 | Discharge: 2013-11-04 | Disposition: A | Discharge status: Home / Self Care | Payer: Medicare Other | Source: Ambulatory Visit | Attending: Neurosurgery | Admitting: Neurosurgery

## 2013-11-04 ENCOUNTER — Encounter (HOSPITAL_COMMUNITY): Payer: Self-pay

## 2013-11-04 ENCOUNTER — Ambulatory Visit (HOSPITAL_COMMUNITY)
Admission: RE | Admit: 2013-11-04 | Discharge: 2013-11-04 | Disposition: A | Discharge status: Home / Self Care | Payer: Medicare Other | Source: Ambulatory Visit | Attending: Neurosurgery | Admitting: Neurosurgery

## 2013-11-04 DIAGNOSIS — M545 Low back pain: Secondary | ICD-10-CM | POA: Insufficient documentation

## 2013-11-04 DIAGNOSIS — G544 Lumbosacral root disorders, not elsewhere classified: Secondary | ICD-10-CM | POA: Diagnosis not present

## 2013-11-04 DIAGNOSIS — Z01818 Encounter for other preprocedural examination: Secondary | ICD-10-CM | POA: Insufficient documentation

## 2013-11-04 HISTORY — DX: Unspecified osteoarthritis, unspecified site: M19.90

## 2013-11-04 HISTORY — DX: Cough, unspecified: R05.9

## 2013-11-04 HISTORY — DX: Cough: R05

## 2013-11-04 LAB — CBC
HCT: 40.3 % (ref 36.0–46.0)
Hemoglobin: 13.7 g/dL (ref 12.0–15.0)
MCH: 29.8 pg (ref 26.0–34.0)
MCHC: 34 g/dL (ref 30.0–36.0)
MCV: 87.6 fL (ref 78.0–100.0)
Platelets: 301 10*3/uL (ref 150–400)
RBC: 4.6 MIL/uL (ref 3.87–5.11)
RDW: 12.1 % (ref 11.5–15.5)
WBC: 7.1 10*3/uL (ref 4.0–10.5)

## 2013-11-04 LAB — COMPREHENSIVE METABOLIC PANEL
ALT: 11 U/L (ref 0–35)
AST: 15 U/L (ref 0–37)
Albumin: 3.9 g/dL (ref 3.5–5.2)
Alkaline Phosphatase: 68 U/L (ref 39–117)
Anion gap: 13 (ref 5–15)
BUN: 13 mg/dL (ref 6–23)
CHLORIDE: 102 meq/L (ref 96–112)
CO2: 24 meq/L (ref 19–32)
CREATININE: 0.81 mg/dL (ref 0.50–1.10)
Calcium: 9.1 mg/dL (ref 8.4–10.5)
GFR, EST NON AFRICAN AMERICAN: 85 mL/min — AB (ref >90)
GLUCOSE: 137 mg/dL — AB (ref 70–99)
Potassium: 3.7 mEq/L (ref 3.7–5.3)
Sodium: 139 mEq/L (ref 137–147)
Total Bilirubin: 0.2 mg/dL — ABNORMAL LOW (ref 0.3–1.2)
Total Protein: 6.9 g/dL (ref 6.0–8.3)

## 2013-11-04 LAB — SURGICAL PCR SCREEN
MRSA, PCR: NEGATIVE
Staphylococcus aureus: NEGATIVE

## 2013-11-04 LAB — TYPE AND SCREEN
ABO/RH(D): O POS
Antibody Screen: NEGATIVE

## 2013-11-04 LAB — ABO/RH: ABO/RH(D): O POS

## 2013-11-04 NOTE — Progress Notes (Signed)
MESSAGE LEFT FOR SUSAN  THAT ORDERS NEED SIGNED BY DR. CABBELL.

## 2013-11-04 NOTE — Pre-Procedure Instructions (Addendum)
Ashley [name]  11/04/2013   Your procedure is scheduled on:   Wednesday  11/09/13  Report to [facility] Admitting at 1030 AM.  Call this number if you have problems the morning of surgery: [phone]   Remember:   Do not eat food or drink liquids after midnight.   Take these medicines the morning of surgery with A SIP OF WATER:  ALPRAZOLAM (XANAX), GABAPENTIN, PANTOPRAZOLE (PROTONIX), PROPRANOLOL (INDERAL) , OXYCODONE IF NEEDED STOP ASPIRIN, COUMADIN, PLAVIX ,EFFIENT, HERBAL MEDICINES  Do not wear jewelry, make-up or nail polish.  Do not wear lotions, powders, or perfumes. You may wear deodorant.  Do not shave 48 hours prior to surgery. Men may shave face and neck.  Do not bring valuables to the hospital.  [facility] is not responsible                  for any belongings or valuables.               Contacts, dentures or bridgework may not be worn into surgery.  Leave suitcase in the car. After surgery it may be brought to your room.  For patients admitted to the hospital, discharge time is determined by your                treatment team.               Patients discharged the day of surgery will not be allowed to drive  home.  Name and phone number of your driver:   Special Instructions:  Special Instructions: Ashley [name] - Preparing for Surgery  Before surgery, you can play an important role.  Because skin is not sterile, your skin needs to be as free of germs as possible.  You can reduce the number of germs on you skin by washing with CHG (chlorahexidine gluconate) soap before surgery.  CHG is an antiseptic cleaner which kills germs and bonds with the skin to continue killing germs even after washing.  Please DO NOT use if you have an allergy to CHG or antibacterial soaps.  If your skin becomes reddened/irritated stop using the CHG and inform your nurse when you arrive at Short Stay.  Do not shave (including legs and underarms) for at least 48 hours prior to the first  CHG shower.  You may shave your face.  Please follow these instructions carefully:   1.  Shower with CHG Soap the night before surgery and the morning of Surgery.  2.  If you choose to wash your hair, wash your hair first as usual with your normal shampoo.  3.  After you shampoo, rinse your hair and body thoroughly to remove the Shampoo.  4.  Use CHG as you would any other liquid soap. You can apply chg directly to the skin and wash gently with scrungie or a clean washcloth.  5.  Apply the CHG Soap to your body ONLY FROM THE NECK DOWN.  Do not use on open wounds or open sores.  Avoid contact with your eyes, ears, mouth and genitals (private parts).  Wash genitals (private parts with your normal soap.  6.  Wash thoroughly, paying special attention to the area where your surgery will be performed.  7.  Thoroughly rinse your body with warm water from the neck down.  8.  DO NOT shower/wash with your normal soap after using and rinsing off the CHG Soap.  9.  Pat yourself dry with a clean towel.  10.  Wear clean pajamas.            11.  Place clean sheets on your bed the night of your first shower and do not sleep with pets.  Day of Surgery  Do not apply any lotions/deodorants the morning of surgery.  Please wear clean clothes to the hospital/surgery center.   Please read over the following fact sheets that you were given: Pain Booklet, Coughing and Deep Breathing, Blood Transfusion Information, MRSA Information and Surgical Site Infection Prevention

## 2013-11-04 NOTE — Progress Notes (Signed)
11/04/13 1553  OBSTRUCTIVE SLEEP APNEA  Have you ever been diagnosed with sleep apnea through a sleep study? No (SUPPOSED TO HAVE SLLEP STUDY)  Do you snore loudly (loud enough to be heard through closed doors)?  1  Do you often feel tired, fatigued, or sleepy during the daytime? 1  Has anyone observed you stop breathing during your sleep? 1  Do you have, or are you being treated for high blood pressure? 1  BMI more than 35 kg/m2? 0  Age over 48 years old? 0  Neck circumference greater than 40 cm/16 inches? 0  Gender: 0  Obstructive Sleep Apnea Score 4

## 2013-11-08 MED ORDER — CEFAZOLIN SODIUM-DEXTROSE 2-3 GM-% IV SOLR
2.0000 g | INTRAVENOUS | Status: AC
  Administered 2013-11-09 (×2): 2 g via INTRAVENOUS
  Filled 2013-11-08: qty 50

## 2013-11-08 NOTE — Progress Notes (Signed)
Patient aware of time change.  Will arrive @ 0630.  She would like her husband to be updated.  Last surgery, that was here, he was not informed and would not like a repeat performance.  DA

## 2013-11-09 ENCOUNTER — Inpatient Hospital Stay (HOSPITAL_COMMUNITY)
Admission: RE | Admit: 2013-11-09 | Discharge: 2013-11-12 | DRG: 460 | Disposition: A | Discharge status: Home / Self Care | Payer: Medicare Other | Source: Ambulatory Visit | Attending: Neurosurgery | Admitting: Neurosurgery

## 2013-11-09 ENCOUNTER — Inpatient Hospital Stay (HOSPITAL_COMMUNITY): Payer: Medicare Other

## 2013-11-09 ENCOUNTER — Encounter (HOSPITAL_COMMUNITY): Payer: Self-pay | Admitting: Anesthesiology

## 2013-11-09 ENCOUNTER — Encounter (HOSPITAL_COMMUNITY): Payer: Medicare Other | Admitting: Anesthesiology

## 2013-11-09 ENCOUNTER — Inpatient Hospital Stay (HOSPITAL_COMMUNITY): Payer: Medicare Other | Admitting: Anesthesiology

## 2013-11-09 ENCOUNTER — Encounter (HOSPITAL_COMMUNITY)
Admission: RE | Disposition: A | Discharge status: Home / Self Care | Payer: Self-pay | Source: Ambulatory Visit | Attending: Neurosurgery

## 2013-11-09 DIAGNOSIS — K219 Gastro-esophageal reflux disease without esophagitis: Secondary | ICD-10-CM | POA: Diagnosis present

## 2013-11-09 DIAGNOSIS — Z4889 Encounter for other specified surgical aftercare: Secondary | ICD-10-CM | POA: Diagnosis not present

## 2013-11-09 DIAGNOSIS — M797 Fibromyalgia: Secondary | ICD-10-CM | POA: Diagnosis present

## 2013-11-09 DIAGNOSIS — M5126 Other intervertebral disc displacement, lumbar region: Principal | ICD-10-CM | POA: Diagnosis present

## 2013-11-09 DIAGNOSIS — I10 Essential (primary) hypertension: Secondary | ICD-10-CM | POA: Diagnosis present

## 2013-11-09 DIAGNOSIS — F419 Anxiety disorder, unspecified: Secondary | ICD-10-CM | POA: Diagnosis present

## 2013-11-09 DIAGNOSIS — M545 Low back pain: Secondary | ICD-10-CM | POA: Diagnosis not present

## 2013-11-09 DIAGNOSIS — M532X6 Spinal instabilities, lumbar region: Secondary | ICD-10-CM | POA: Diagnosis not present

## 2013-11-09 DIAGNOSIS — M549 Dorsalgia, unspecified: Secondary | ICD-10-CM | POA: Diagnosis not present

## 2013-11-09 HISTORY — PX: LUMBAR FUSION: SHX111

## 2013-11-09 SURGERY — POSTERIOR LUMBAR FUSION 1 LEVEL
Anesthesia: General

## 2013-11-09 MED ORDER — PROMETHAZINE HCL 25 MG/ML IJ SOLN: 6.2500 mg | INTRAMUSCULAR | Status: DC | PRN

## 2013-11-09 MED ORDER — BUTALBITAL-APAP-CAFFEINE 50-325-40 MG PO TABS
1.0000 | ORAL_TABLET | ORAL | Status: DC | PRN
  Administered 2013-11-09 – 2013-11-11 (×4): 1 via ORAL
  Filled 2013-11-09 (×4): qty 1

## 2013-11-09 MED ORDER — ALPRAZOLAM ER 0.5 MG PO TB24: 0.5000 mg | ORAL_TABLET | Freq: Two times a day (BID) | ORAL | Status: DC | PRN

## 2013-11-09 MED ORDER — MIDAZOLAM HCL 2 MG/2ML IJ SOLN
INTRAMUSCULAR | Status: AC
  Filled 2013-11-09: qty 2

## 2013-11-09 MED ORDER — PROMETHAZINE HCL 25 MG PO TABS
25.0000 mg | ORAL_TABLET | Freq: Four times a day (QID) | ORAL | Status: DC | PRN
  Administered 2013-11-11 – 2013-11-12 (×4): 25 mg via ORAL
  Filled 2013-11-09 (×5): qty 1

## 2013-11-09 MED ORDER — STERILE WATER FOR INJECTION IJ SOLN
INTRAMUSCULAR | Status: AC
  Filled 2013-11-09: qty 10

## 2013-11-09 MED ORDER — VECURONIUM BROMIDE 10 MG IV SOLR
INTRAVENOUS | Status: DC | PRN
  Administered 2013-11-09: 2 mg via INTRAVENOUS
  Administered 2013-11-09: 3 mg via INTRAVENOUS

## 2013-11-09 MED ORDER — KETOROLAC TROMETHAMINE 30 MG/ML IJ SOLN
INTRAMUSCULAR | Status: AC
  Administered 2013-11-09: 30 mg via INTRAVENOUS
  Filled 2013-11-09: qty 1

## 2013-11-09 MED ORDER — PHENOL 1.4 % MT LIQD: 1.0000 | OROMUCOSAL | Status: DC | PRN

## 2013-11-09 MED ORDER — ARTIFICIAL TEARS OP OINT
TOPICAL_OINTMENT | OPHTHALMIC | Status: DC | PRN
  Administered 2013-11-09: 1 via OPHTHALMIC

## 2013-11-09 MED ORDER — FENTANYL CITRATE 0.05 MG/ML IJ SOLN
INTRAMUSCULAR | Status: AC
  Administered 2013-11-09: 50 ug via INTRAVENOUS
  Filled 2013-11-09: qty 2

## 2013-11-09 MED ORDER — HYDROMORPHONE HCL 1 MG/ML IJ SOLN
INTRAMUSCULAR | Status: AC
  Administered 2013-11-09: 0.5 mg via INTRAVENOUS
  Filled 2013-11-09: qty 1

## 2013-11-09 MED ORDER — MIDAZOLAM HCL 2 MG/2ML IJ SOLN
0.5000 mg | Freq: Once | INTRAMUSCULAR | Status: AC | PRN
  Administered 2013-11-09: 2 mg via INTRAVENOUS

## 2013-11-09 MED ORDER — OXYCODONE-ACETAMINOPHEN 5-325 MG PO TABS
1.0000 | ORAL_TABLET | ORAL | Status: DC | PRN
  Administered 2013-11-10 – 2013-11-12 (×5): 2 via ORAL
  Filled 2013-11-09 (×6): qty 2

## 2013-11-09 MED ORDER — POTASSIUM CHLORIDE IN NACL 20-0.9 MEQ/L-% IV SOLN
INTRAVENOUS | Status: DC
  Administered 2013-11-09: 19:00:00 via INTRAVENOUS
  Filled 2013-11-09: qty 1000

## 2013-11-09 MED ORDER — PANTOPRAZOLE SODIUM 40 MG PO TBEC
40.0000 mg | DELAYED_RELEASE_TABLET | Freq: Every day | ORAL | Status: DC
  Administered 2013-11-10 – 2013-11-12 (×3): 40 mg via ORAL
  Filled 2013-11-09 (×3): qty 1

## 2013-11-09 MED ORDER — POLYETHYLENE GLYCOL 3350 17 G PO PACK
17.0000 g | PACK | Freq: Every day | ORAL | Status: DC | PRN
  Administered 2013-11-10 – 2013-11-11 (×2): 17 g via ORAL
  Filled 2013-11-09 (×2): qty 1

## 2013-11-09 MED ORDER — EPHEDRINE SULFATE 50 MG/ML IJ SOLN
INTRAMUSCULAR | Status: DC | PRN
  Administered 2013-11-09 (×2): 5 mg via INTRAVENOUS

## 2013-11-09 MED ORDER — GABAPENTIN 800 MG PO TABS: 800.0000 mg | ORAL_TABLET | Freq: Three times a day (TID) | ORAL | Status: DC

## 2013-11-09 MED ORDER — GLYCOPYRROLATE 0.2 MG/ML IJ SOLN
INTRAMUSCULAR | Status: DC | PRN
  Administered 2013-11-09: 0.4 mg via INTRAVENOUS

## 2013-11-09 MED ORDER — THROMBIN 20000 UNITS EX SOLR
CUTANEOUS | Status: DC | PRN
  Administered 2013-11-09: 10:00:00 via TOPICAL

## 2013-11-09 MED ORDER — NEOSTIGMINE METHYLSULFATE 10 MG/10ML IV SOLN
INTRAVENOUS | Status: AC
  Filled 2013-11-09: qty 1

## 2013-11-09 MED ORDER — ACETAMINOPHEN 10 MG/ML IV SOLN
1000.0000 mg | Freq: Once | INTRAVENOUS | Status: AC
  Administered 2013-11-09: 1000 mg via INTRAVENOUS

## 2013-11-09 MED ORDER — ROCURONIUM BROMIDE 100 MG/10ML IV SOLN
INTRAVENOUS | Status: DC | PRN
  Administered 2013-11-09: 50 mg via INTRAVENOUS

## 2013-11-09 MED ORDER — MENTHOL 3 MG MT LOZG: 1.0000 | LOZENGE | OROMUCOSAL | Status: DC | PRN

## 2013-11-09 MED ORDER — SUFENTANIL CITRATE 50 MCG/ML IV SOLN
INTRAVENOUS | Status: AC
  Filled 2013-11-09: qty 1

## 2013-11-09 MED ORDER — ACETAMINOPHEN 325 MG PO TABS
650.0000 mg | ORAL_TABLET | ORAL | Status: DC | PRN
  Administered 2013-11-10: 650 mg via ORAL
  Filled 2013-11-09: qty 2

## 2013-11-09 MED ORDER — MIDAZOLAM HCL 2 MG/2ML IJ SOLN
INTRAMUSCULAR | Status: AC
  Administered 2013-11-09: 2 mg via INTRAVENOUS
  Filled 2013-11-09: qty 2

## 2013-11-09 MED ORDER — DIAZEPAM 5 MG PO TABS
5.0000 mg | ORAL_TABLET | Freq: Four times a day (QID) | ORAL | Status: DC | PRN
  Administered 2013-11-10 – 2013-11-11 (×4): 5 mg via ORAL
  Filled 2013-11-09 (×5): qty 1

## 2013-11-09 MED ORDER — 0.9 % SODIUM CHLORIDE (POUR BTL) OPTIME
TOPICAL | Status: DC | PRN
  Administered 2013-11-09: 1000 mL

## 2013-11-09 MED ORDER — GLYCOPYRROLATE 0.2 MG/ML IJ SOLN
INTRAMUSCULAR | Status: AC
  Filled 2013-11-09: qty 2

## 2013-11-09 MED ORDER — PROPOFOL 10 MG/ML IV BOLUS
INTRAVENOUS | Status: AC
  Filled 2013-11-09: qty 20

## 2013-11-09 MED ORDER — VECURONIUM BROMIDE 10 MG IV SOLR
INTRAVENOUS | Status: AC
  Filled 2013-11-09: qty 10

## 2013-11-09 MED ORDER — SODIUM CHLORIDE 0.9 % IJ SOLN
INTRAMUSCULAR | Status: AC
  Filled 2013-11-09: qty 10

## 2013-11-09 MED ORDER — HYDROCODONE-ACETAMINOPHEN 5-325 MG PO TABS
1.0000 | ORAL_TABLET | ORAL | Status: DC | PRN
  Administered 2013-11-11 (×2): 2 via ORAL
  Filled 2013-11-09 (×2): qty 2

## 2013-11-09 MED ORDER — LIDOCAINE-EPINEPHRINE 0.5 %-1:200000 IJ SOLN
INTRAMUSCULAR | Status: DC | PRN
Start: 2013-11-09 — End: 2013-11-09
  Administered 2013-11-09: 10 mL

## 2013-11-09 MED ORDER — SCOPOLAMINE 1 MG/3DAYS TD PT72
MEDICATED_PATCH | TRANSDERMAL | Status: DC | PRN
  Administered 2013-11-09: 1 via TRANSDERMAL

## 2013-11-09 MED ORDER — GLYCOPYRROLATE 0.2 MG/ML IJ SOLN
INTRAMUSCULAR | Status: AC
  Filled 2013-11-09: qty 1

## 2013-11-09 MED ORDER — NEOSTIGMINE METHYLSULFATE 10 MG/10ML IV SOLN
INTRAVENOUS | Status: DC | PRN
  Administered 2013-11-09: 3 mg via INTRAVENOUS

## 2013-11-09 MED ORDER — GABAPENTIN 400 MG PO CAPS: 800.0000 mg | ORAL_CAPSULE | Freq: Three times a day (TID) | ORAL | Status: DC

## 2013-11-09 MED ORDER — ROCURONIUM BROMIDE 50 MG/5ML IV SOLN
INTRAVENOUS | Status: AC
  Filled 2013-11-09: qty 1

## 2013-11-09 MED ORDER — CEFAZOLIN SODIUM-DEXTROSE 2-3 GM-% IV SOLR
INTRAVENOUS | Status: AC
  Filled 2013-11-09: qty 50

## 2013-11-09 MED ORDER — OXYCODONE HCL 5 MG PO TABS
10.0000 mg | ORAL_TABLET | ORAL | Status: AC | PRN
  Administered 2013-11-09: 10 mg via ORAL

## 2013-11-09 MED ORDER — ACETAMINOPHEN 10 MG/ML IV SOLN
INTRAVENOUS | Status: AC
  Filled 2013-11-09: qty 100

## 2013-11-09 MED ORDER — HYDROMORPHONE HCL 1 MG/ML IJ SOLN
INTRAMUSCULAR | Status: AC
  Administered 2013-11-09: 1 mg via INTRAVENOUS
  Filled 2013-11-09: qty 1

## 2013-11-09 MED ORDER — EPHEDRINE SULFATE 50 MG/ML IJ SOLN
INTRAMUSCULAR | Status: AC
  Filled 2013-11-09: qty 1

## 2013-11-09 MED ORDER — DEXAMETHASONE SODIUM PHOSPHATE 4 MG/ML IJ SOLN
INTRAMUSCULAR | Status: DC | PRN
  Administered 2013-11-09: 8 mg via INTRAVENOUS

## 2013-11-09 MED ORDER — PROPRANOLOL HCL ER 80 MG PO CP24
80.0000 mg | ORAL_CAPSULE | Freq: Every day | ORAL | Status: DC
  Administered 2013-11-10 – 2013-11-12 (×3): 80 mg via ORAL
  Filled 2013-11-09 (×3): qty 1

## 2013-11-09 MED ORDER — HYDROMORPHONE HCL 1 MG/ML IJ SOLN
0.5000 mg | INTRAMUSCULAR | Status: DC | PRN
  Administered 2013-11-09: 1 mg via INTRAVENOUS

## 2013-11-09 MED ORDER — GABAPENTIN 100 MG PO CAPS: 100.0000 mg | ORAL_CAPSULE | Freq: Three times a day (TID) | ORAL | Status: DC

## 2013-11-09 MED ORDER — ONDANSETRON HCL 4 MG/2ML IJ SOLN
4.0000 mg | INTRAMUSCULAR | Status: DC | PRN
Start: 2013-11-09 — End: 2013-11-12
  Administered 2013-11-09: 4 mg via INTRAVENOUS
  Filled 2013-11-09: qty 2

## 2013-11-09 MED ORDER — HYDROMORPHONE HCL 1 MG/ML IJ SOLN
0.5000 mg | INTRAMUSCULAR | Status: AC | PRN
  Administered 2013-11-09 (×4): 0.5 mg via INTRAVENOUS

## 2013-11-09 MED ORDER — FENTANYL CITRATE 0.05 MG/ML IJ SOLN
25.0000 ug | INTRAMUSCULAR | Status: DC | PRN
  Administered 2013-11-09 (×2): 50 ug via INTRAVENOUS

## 2013-11-09 MED ORDER — ARTIFICIAL TEARS OP OINT
TOPICAL_OINTMENT | OPHTHALMIC | Status: AC
  Filled 2013-11-09: qty 3.5

## 2013-11-09 MED ORDER — MIDAZOLAM HCL 5 MG/5ML IJ SOLN
INTRAMUSCULAR | Status: DC | PRN
  Administered 2013-11-09: 2 mg via INTRAVENOUS

## 2013-11-09 MED ORDER — ONDANSETRON HCL 4 MG/2ML IJ SOLN
INTRAMUSCULAR | Status: AC
  Filled 2013-11-09: qty 2

## 2013-11-09 MED ORDER — LIDOCAINE HCL (CARDIAC) 20 MG/ML IV SOLN
INTRAVENOUS | Status: DC | PRN
  Administered 2013-11-09: 80 mg via INTRAVENOUS

## 2013-11-09 MED ORDER — LACTATED RINGERS IV SOLN
INTRAVENOUS | Status: DC | PRN
  Administered 2013-11-09 (×3): via INTRAVENOUS

## 2013-11-09 MED ORDER — OXYCODONE HCL 5 MG PO TABS
ORAL_TABLET | ORAL | Status: AC
  Administered 2013-11-09: 10 mg via ORAL
  Filled 2013-11-09: qty 2

## 2013-11-09 MED ORDER — ALPRAZOLAM 0.5 MG PO TABS
0.5000 mg | ORAL_TABLET | Freq: Two times a day (BID) | ORAL | Status: DC | PRN
  Administered 2013-11-09 – 2013-11-10 (×2): 0.5 mg via ORAL
  Filled 2013-11-09 (×3): qty 1

## 2013-11-09 MED ORDER — GABAPENTIN 300 MG PO CAPS
900.0000 mg | ORAL_CAPSULE | Freq: Three times a day (TID) | ORAL | Status: DC
  Administered 2013-11-09 – 2013-11-12 (×9): 900 mg via ORAL
  Filled 2013-11-09: qty 3
  Filled 2013-11-09: qty 9
  Filled 2013-11-09 (×8): qty 3

## 2013-11-09 MED ORDER — SODIUM CHLORIDE 0.9 % IV SOLN: 250.0000 mL | INTRAVENOUS | Status: DC

## 2013-11-09 MED ORDER — KETOROLAC TROMETHAMINE 30 MG/ML IJ SOLN
15.0000 mg | Freq: Once | INTRAMUSCULAR | Status: AC | PRN
  Administered 2013-11-09: 30 mg via INTRAVENOUS

## 2013-11-09 MED ORDER — PHENYLEPHRINE 40 MCG/ML (10ML) SYRINGE FOR IV PUSH (FOR BLOOD PRESSURE SUPPORT)
PREFILLED_SYRINGE | INTRAVENOUS | Status: AC
  Filled 2013-11-09: qty 10

## 2013-11-09 MED ORDER — ACETAMINOPHEN 650 MG RE SUPP: 650.0000 mg | RECTAL | Status: DC | PRN

## 2013-11-09 MED ORDER — LABETALOL HCL 5 MG/ML IV SOLN
INTRAVENOUS | Status: DC | PRN
  Administered 2013-11-09: 5 mg via INTRAVENOUS

## 2013-11-09 MED ORDER — LIDOCAINE HCL (CARDIAC) 20 MG/ML IV SOLN
INTRAVENOUS | Status: AC
  Filled 2013-11-09: qty 5

## 2013-11-09 MED ORDER — SUFENTANIL CITRATE 50 MCG/ML IV SOLN
INTRAVENOUS | Status: DC | PRN
  Administered 2013-11-09: 5 ug via INTRAVENOUS
  Administered 2013-11-09: 10 ug via INTRAVENOUS
  Administered 2013-11-09 (×4): 5 ug via INTRAVENOUS
  Administered 2013-11-09: 10 ug via INTRAVENOUS
  Administered 2013-11-09 (×4): 5 ug via INTRAVENOUS

## 2013-11-09 MED ORDER — MEPERIDINE HCL 25 MG/ML IJ SOLN: 6.2500 mg | INTRAMUSCULAR | Status: DC | PRN

## 2013-11-09 MED ORDER — ONDANSETRON HCL 4 MG/2ML IJ SOLN
INTRAMUSCULAR | Status: DC | PRN
  Administered 2013-11-09 (×2): 4 mg via INTRAVENOUS

## 2013-11-09 MED ORDER — CEFAZOLIN SODIUM 1-5 GM-% IV SOLN
1.0000 g | Freq: Three times a day (TID) | INTRAVENOUS | Status: AC
  Administered 2013-11-09 – 2013-11-10 (×2): 1 g via INTRAVENOUS
  Filled 2013-11-09 (×2): qty 50

## 2013-11-09 MED ORDER — HYDROMORPHONE HCL 1 MG/ML IJ SOLN
0.5000 mg | INTRAMUSCULAR | Status: DC | PRN
  Administered 2013-11-09 – 2013-11-10 (×10): 1 mg via INTRAVENOUS
  Filled 2013-11-09 (×12): qty 1

## 2013-11-09 MED ORDER — SENNA 8.6 MG PO TABS
1.0000 | ORAL_TABLET | Freq: Two times a day (BID) | ORAL | Status: DC
  Administered 2013-11-09 – 2013-11-12 (×6): 8.6 mg via ORAL
  Filled 2013-11-09 (×6): qty 1

## 2013-11-09 MED ORDER — ALBUMIN HUMAN 5 % IV SOLN
INTRAVENOUS | Status: DC | PRN
Start: 2013-11-09 — End: 2013-11-09
  Administered 2013-11-09: 12:00:00 via INTRAVENOUS

## 2013-11-09 MED ORDER — SODIUM CHLORIDE 0.9 % IJ SOLN
3.0000 mL | INTRAMUSCULAR | Status: DC | PRN
  Administered 2013-11-10: 3 mL via INTRAVENOUS

## 2013-11-09 MED ORDER — SUCCINYLCHOLINE CHLORIDE 20 MG/ML IJ SOLN
INTRAMUSCULAR | Status: AC
  Filled 2013-11-09: qty 1

## 2013-11-09 MED ORDER — SCOPOLAMINE 1 MG/3DAYS TD PT72
MEDICATED_PATCH | TRANSDERMAL | Status: AC
  Filled 2013-11-09: qty 1

## 2013-11-09 MED ORDER — PROPOFOL 10 MG/ML IV BOLUS
INTRAVENOUS | Status: DC | PRN
  Administered 2013-11-09: 190 mg via INTRAVENOUS
  Administered 2013-11-09: 50 mg via INTRAVENOUS

## 2013-11-09 MED ORDER — SODIUM CHLORIDE 0.9 % IJ SOLN
3.0000 mL | Freq: Two times a day (BID) | INTRAMUSCULAR | Status: DC
  Administered 2013-11-09 – 2013-11-12 (×5): 3 mL via INTRAVENOUS

## 2013-11-09 SURGICAL SUPPLY — 75 items
ADH SKN CLS APL DERMABOND .7 (GAUZE/BANDAGES/DRESSINGS) ×1
ADH SKN CLS LQ APL DERMABOND (GAUZE/BANDAGES/DRESSINGS) ×1
APL SKNCLS STERI-STRIP NONHPOA (GAUZE/BANDAGES/DRESSINGS)
AQUACEL ×3 IMPLANT
BENZOIN TINCTURE PRP APPL 2/3 (GAUZE/BANDAGES/DRESSINGS) IMPLANT
BLADE CLIPPER SURG (BLADE) IMPLANT
BUR MATCHSTICK NEURO 3.0 LAGG (BURR) ×3 IMPLANT
CANISTER SUCT 3000ML (MISCELLANEOUS) ×3 IMPLANT
CLOSURE WOUND 1/2 X4 (GAUZE/BANDAGES/DRESSINGS)
CONT SPEC 4OZ CLIKSEAL STRL BL (MISCELLANEOUS) ×3 IMPLANT
COVER TABLE BACK 60X90 (DRAPES) ×3 IMPLANT
DECANTER SPIKE VIAL GLASS SM (MISCELLANEOUS) ×3 IMPLANT
DERMABOND ADHESIVE PROPEN (GAUZE/BANDAGES/DRESSINGS) ×2
DERMABOND ADVANCED (GAUZE/BANDAGES/DRESSINGS) ×2
DERMABOND ADVANCED .7 DNX12 (GAUZE/BANDAGES/DRESSINGS) ×1 IMPLANT
DERMABOND ADVANCED .7 DNX6 (GAUZE/BANDAGES/DRESSINGS) ×1 IMPLANT
DRAPE C-ARM 42X72 X-RAY (DRAPES) ×6 IMPLANT
DRAPE LAPAROTOMY 100X72X124 (DRAPES) ×3 IMPLANT
DRAPE POUCH INSTRU U-SHP 10X18 (DRAPES) ×3 IMPLANT
DRAPE SURG 17X23 STRL (DRAPES) ×3 IMPLANT
DRSG AQUACEL AG ADV 3.5X10 (GAUZE/BANDAGES/DRESSINGS) ×2 IMPLANT
DRSG TELFA 3X8 NADH (GAUZE/BANDAGES/DRESSINGS) IMPLANT
DURAPREP 26ML APPLICATOR (WOUND CARE) ×3 IMPLANT
ELECT REM PT RETURN 9FT ADLT (ELECTROSURGICAL) ×3
ELECTRODE REM PT RTRN 9FT ADLT (ELECTROSURGICAL) ×1 IMPLANT
GAUZE SPONGE 4X4 12PLY STRL (GAUZE/BANDAGES/DRESSINGS) IMPLANT
GAUZE SPONGE 4X4 16PLY XRAY LF (GAUZE/BANDAGES/DRESSINGS) IMPLANT
GLOVE BIOGEL PI IND STRL 7.0 (GLOVE) ×3 IMPLANT
GLOVE BIOGEL PI IND STRL 7.5 (GLOVE) ×1 IMPLANT
GLOVE BIOGEL PI INDICATOR 7.0 (GLOVE) ×6
GLOVE BIOGEL PI INDICATOR 7.5 (GLOVE) ×2
GLOVE ECLIPSE 6.5 STRL STRAW (GLOVE) ×6 IMPLANT
GLOVE EXAM NITRILE LRG STRL (GLOVE) IMPLANT
GLOVE EXAM NITRILE MD LF STRL (GLOVE) IMPLANT
GLOVE EXAM NITRILE XL STR (GLOVE) IMPLANT
GLOVE EXAM NITRILE XS STR PU (GLOVE) IMPLANT
GLOVE SS BIOGEL STRL SZ 6.5 (GLOVE) ×4 IMPLANT
GLOVE SUPERSENSE BIOGEL SZ 6.5 (GLOVE) ×8
GLOVE SURG SS PI 7.0 STRL IVOR (GLOVE) ×9 IMPLANT
GOWN STRL REUS W/ TWL LRG LVL3 (GOWN DISPOSABLE) ×3 IMPLANT
GOWN STRL REUS W/ TWL XL LVL3 (GOWN DISPOSABLE) ×3 IMPLANT
GOWN STRL REUS W/TWL 2XL LVL3 (GOWN DISPOSABLE) IMPLANT
GOWN STRL REUS W/TWL LRG LVL3 (GOWN DISPOSABLE) ×9
GOWN STRL REUS W/TWL XL LVL3 (GOWN DISPOSABLE) ×9
KIT BASIN OR (CUSTOM PROCEDURE TRAY) ×3 IMPLANT
KIT POSITION SURG JACKSON T1 (MISCELLANEOUS) ×3 IMPLANT
KIT ROOM TURNOVER OR (KITS) ×3 IMPLANT
NEEDLE HYPO 25X1 1.5 SAFETY (NEEDLE) ×3 IMPLANT
NEEDLE SPNL 18GX3.5 QUINCKE PK (NEEDLE) IMPLANT
NS IRRIG 1000ML POUR BTL (IV SOLUTION) ×3 IMPLANT
PACK FOAM VITOSS 5CC (Orthopedic Implant) ×3 IMPLANT
PACK LAMINECTOMY NEURO (CUSTOM PROCEDURE TRAY) ×3 IMPLANT
PAD ARMBOARD 7.5X6 YLW CONV (MISCELLANEOUS) ×6 IMPLANT
ROD 35MM (Rod) ×3 IMPLANT
ROD 5.5X40MM (Rod) ×3 IMPLANT
SCREW LOCK (Screw) ×12 IMPLANT
SCREW LOCK FXNS SPNE MAS PL (Screw) ×4 IMPLANT
SCREW SHANK 5.0X30MM (Screw) ×3 IMPLANT
SCREW SHANK PLIF 5.0X25 LUMBAR (Screw) ×3 IMPLANT
SCREW SHANK PLIF MAS 5.5X40 (Screw) ×3 IMPLANT
SCREW TULIP 5.5 (Screw) ×9 IMPLANT
SPACER OPAL REVOLVE 10MM ×6 IMPLANT
SPONGE LAP 4X18 X RAY DECT (DISPOSABLE) IMPLANT
SPONGE SURGIFOAM ABS GEL 100 (HEMOSTASIS) ×3 IMPLANT
STRIP CLOSURE SKIN 1/2X4 (GAUZE/BANDAGES/DRESSINGS) IMPLANT
SUT PROLENE 6 0 BV (SUTURE) IMPLANT
SUT VIC AB 0 CT1 18XCR BRD8 (SUTURE) ×1 IMPLANT
SUT VIC AB 0 CT1 8-18 (SUTURE) ×3
SUT VIC AB 2-0 CT1 18 (SUTURE) ×3 IMPLANT
SUT VIC AB 3-0 SH 8-18 (SUTURE) ×3 IMPLANT
SYR 20ML ECCENTRIC (SYRINGE) ×3 IMPLANT
TOWEL OR 17X24 6PK STRL BLUE (TOWEL DISPOSABLE) ×3 IMPLANT
TOWEL OR 17X26 10 PK STRL BLUE (TOWEL DISPOSABLE) ×3 IMPLANT
TRAY FOLEY CATH 14FRSI W/METER (CATHETERS) ×3 IMPLANT
WATER STERILE IRR 1000ML POUR (IV SOLUTION) ×3 IMPLANT

## 2013-11-09 NOTE — Anesthesia Preprocedure Evaluation (Addendum)
Anesthesia Evaluation  Patient identified by MRN, date of birth, ID band Patient awake    Reviewed: Allergy & Precautions, H&P , Patient's Chart, lab work & pertinent test results, reviewed documented beta blocker date and time   History of Anesthesia Complications (+) PONV and history of anesthetic complications  Airway Mallampati: II TM Distance: >3 FB Neck ROM: Limited    Dental  (+) Teeth Intact, Dental Advisory Given   Pulmonary  breath sounds clear to auscultation        Cardiovascular Exercise Tolerance: Good hypertension, Pt. on medications and Pt. on home beta blockers + dysrhythmias Rhythm:regular Rate:Normal     Neuro/Psych  Headaches, PSYCHIATRIC DISORDERS Anxiety Depression  Neuromuscular disease    GI/Hepatic Neg liver ROS, PUD, GERD-  Controlled and Medicated,  Endo/Other  Morbid obesity  Renal/GU negative Renal ROS     Musculoskeletal  (+) Arthritis -, Fibromyalgia -  Abdominal   Peds  Hematology negative hematology ROS (+)   Anesthesia Other Findings   Reproductive/Obstetrics negative OB ROS                         Anesthesia Physical Anesthesia Plan  ASA: III  Anesthesia Plan: General ETT   Post-op Pain Management:    Induction:   Airway Management Planned:   Additional Equipment:   Intra-op Plan:   Post-operative Plan:   Informed Consent: I have reviewed the patients History and Physical, chart, labs and discussed the procedure including the risks, benefits and alternatives for the proposed anesthesia with the patient or authorized representative who has indicated his/her understanding and acceptance.   Dental Advisory Given  Plan Discussed with: CRNA and Surgeon  Anesthesia Plan Comments:         Anesthesia Quick Evaluation

## 2013-11-09 NOTE — Progress Notes (Signed)
Dr. Christella Noa made aware pt was distressed at having pain post op. Stated "I was to have medicine stronger than Fentanyl to make me comfortable. " pt being treated with PACU POST op orders

## 2013-11-09 NOTE — Progress Notes (Signed)
Dr. Al Corpus at bedside  , discussing pain control .

## 2013-11-09 NOTE — Transfer of Care (Signed)
Immediate Anesthesia Transfer of Care Note  Patient: Ashley [name]  Procedure(s) Performed: Procedure(s): Lumbar four/five posterior lumbar interbody fusion with interbody prosthesis posterior lateral arthrodesis and posterior nonsegmental instrumentation (N/A)  Patient Location: PACU  Anesthesia Type:General  Level of Consciousness: awake, oriented and patient cooperative  Airway & Oxygen Therapy: Patient Spontanous Breathing and Patient connected to face mask oxygen  Post-op Assessment: Report given to PACU RN and Post -op Vital signs reviewed and stable  Post vital signs: Reviewed  Complications: No apparent anesthesia complications

## 2013-11-09 NOTE — Progress Notes (Signed)
Utilization review completed.  

## 2013-11-09 NOTE — Anesthesia Postprocedure Evaluation (Signed)
Anesthesia Post Note  Patient: Ashley [name]  Procedure(s) Performed: Procedure(s) (LRB): Lumbar four/five posterior lumbar interbody fusion with interbody prosthesis posterior lateral arthrodesis and posterior nonsegmental instrumentation (N/A)  Anesthesia type: GA  Patient location: PACU  Post pain: Pain level controlled  Post assessment: Post-op Vital signs reviewed  Last Vitals:  Filed Vitals:   11/09/13 1346  BP: 138/80  Pulse: 88  Temp: 37.1 C  Resp: 18    Post vital signs: Reviewed  Level of consciousness: sedated  Complications: No apparent anesthesia complications

## 2013-11-09 NOTE — H&P (Signed)
BP 148/94  Pulse 68  Temp(Src) 98.3 F (36.8 C) (Oral)  Resp 18  Wt 84.823 kg (187 lb)  SpO2 98% HOPI: Ashley [name] comes back today. She has had no real improvement in her pain. She has continued problems in the lower back. She had an MRI done again this summer looking for possible problem in the hip as mostly pain is on the right side whereas previously been on the left. That film did not show any significant problems. She only had a disc bulge with scar where she had undergone the operation at 4-5 for a large left-sided disc herniation, which certainly is not present now. I had done an MRI after operation to take a look since she had pain and that was clean and was clean on the MRI of her pelvis. She still said she has a great deal of pain in the neck, but does not bother her to the great degree that her back is currently. PHYSICAL EXAMINATION: Vital Signs: Height of 5 feet 4 inches, weight 189 pounds, blood pressure is 159/105, pulse is 92, respiratory rate is 16, temperature is 99.3. Pain is 6/10. On examination, she is alert and oriented x4 answering all questions appropriately. Appeared to be in some mild distress. Normal muscle tone, bulk, and coordination. Reflexes are intact at the knees, left and right ankle, and in the upper extremities, all which were 2+, ankles possibly 1+. Romberg test is negative. Pupils equal, round, and reactive to light. Full extraocular movements. Full visual fields. Hearing intact to voice. Uvula elevates in the midline. Ashley [name] returned today with a myelogram and post myelogram CT.  DATA: What it shows is that she has some instability at the L4-5 level. She does have a recurrent disc herniation there now. This was not present on the MRI which was preformed in March of 2015. She has most of the pain in the right lower extremity. Without question the disc is eccentric to the left far more than it is to the right, but with the instability seen on the flexion and  extension views it is reasonable that she has the pain in the right leg. She also has a great deal of back pain.  INTERVAL PFSH: Medications include Cymbalta, Flexeril, gabapentin, oxycodone, Phenergan, propranolol, and Protonix.SHE IS ALLERGIC TO MORPHINE AND SULFA CONTAINING MEDICATIONS.  PHYSICAL EXAMINATION: She is 5 feet 4 inches. Weighs 186.6 pounds. Blood pressure is 142/95. Pulse is 90. Respiratory rate is 16. Temperature is 98.1. Pain is described as 5/10.  On exam she is alert, oriented x4, and answering all questions appropriately. Memory, language, attention span, and fund of knowledge are normal. She is well kempt and in mild distress. She has full strength on today's exam. She has an antalgic gait. Normal muscle tone, bulk, and coordination. Speech is clear and fluent. Tongue and uvula are in the midline. Shoulder shrug is normal. Hearing is intact to voice. IMPRESSION/PLAN: Given the instability, given the pain, and given the size of the recurrence I do think that a lumbar fusion at L4-5 with instrumentation is probably the indicated procedure to decompress the spinal canal and also keep her from again having another ruptured disc. This would allow for complete and full decompression of the L4 root and of the L5 root on the right side. She is already arthropathic in both joints at L4-5. Again, we discussed this at length and I do believe this is her best option. She will undergo a lumbar interbody arthrodesis, decompression,  and pedicle screw instrumentation for arthrodesis at L4-5. Risks and benefits were explained. Bleeding, infection, no relief, need for further surgery, fusion failure, hardware failure, damage to the nerve roots causing weakness in one or both lower extremities. This and other risks were discussed. She also received a detailed instruction sheet today about the operation.

## 2013-11-09 NOTE — Anesthesia Procedure Notes (Signed)
Procedure Name: Intubation Date/Time: 11/09/2013 8:48 AM Performed by: Jenne Campus Pre-anesthesia Checklist: Patient identified, Emergency Drugs available, Suction available, Patient being monitored and Timeout performed Patient Re-evaluated:Patient Re-evaluated prior to inductionOxygen Delivery Method: Circle system utilized Preoxygenation: Pre-oxygenation with 100% oxygen Intubation Type: IV induction Ventilation: Mask ventilation without difficulty Grade View: Grade I Tube type: Oral Tube size: 7.0 mm Number of attempts: 1 Airway Equipment and Method: Stylet and Video-laryngoscopy Placement Confirmation: ETT inserted through vocal cords under direct vision,  positive ETCO2,  CO2 detector and breath sounds checked- equal and bilateral Secured at: 22 cm Tube secured with: Tape Dental Injury: Teeth and Oropharynx as per pre-operative assessment  Difficulty Due To: Difficulty was anticipated and Difficult Airway- due to reduced neck mobility Comments: Elective video-glidescope. Patient had previous neck fusion and has very limited neck mobility. Smooth IV induction. EZ mask. DL x 1 with video-glidescope. Grade I view. 7.0 ETT passed easily - AOI. +ETCO2 bbse. Head and neck remained in neutral position throughout induction and intubation. Soft bite block utilized.

## 2013-11-09 NOTE — Progress Notes (Signed)
Dr. Al Corpus updated on Pt pain level and she has stated that Fentanyl does not relieve her pain. New orders

## 2013-11-09 NOTE — Op Note (Signed)
11/09/2013  1:28 PM  PATIENT:  Ashley [name]  48 y.o. female  PRE-OPERATIVE DIAGNOSIS:  lumbar recurrent herniated disc instability of lumbar spine L4/5  POST-OPERATIVE DIAGNOSIS:  lumbar recurrent herniated disc instability of lumbar spine L4/5  PROCEDURE:  Procedure(s): Lumbar four/five posterior lumbar interbody fusion with interbody prosthesis (Synthes) , local autograft L4, L5 nerve roots decompression lateral to exposure needed just for a PLIF posterior lateral arthrodesis  L4/5 allograft  posterior nonsegmental instrumentation Nuvasive L4, L5  SURGEON:  Surgeon(s): Ashok Pall, MD  ASSISTANTS:Jones, Shanon Brow  ANESTHESIA:   general  EBL:  Total I/O In: 2250 [I.V.:2000; IV Piggyback:250] Out: 825 [Urine:575; Blood:250]  BLOOD ADMINISTERED:none  CELL SAVER GIVEN:none  COUNT:per nursing  DRAINS: none   SPECIMEN:  No Specimen  DICTATION: Ashley [name] is a 48 y.o. female whom was taken to the operating room intubated, and placed under a general anesthetic without difficulty. A foley catheter was placed under sterile conditions. She was positioned prone on a Jackson stable with all pressure points properly padded.  Her lumbar region was prepped and draped in a sterile manner. I infiltrated 10cc's 1/2%lidocaine/1:2000,000 strength epinephrine into the planned incision. I opened the skin with a 10 blade and took the incision down to the thoracolumbar fascia. I exposed the lamina of L3,4,and 5 in a subperiosteal fashion bilaterally. I confirmed my location with an intraoperative xray.  I placed self retaining retractors and started the decompression.  I decompressed the spinal canal and the L4, and L5 roots bilaterally. I performed bilateral L4 inferior facetectomies with the drill and Kerrison punches. I removed ligament overlying the L4 roots to decompress them in the neural foramina. I exposed the disc spaces at L4/5 on both sides inferior to the L4 roots. I then again with the  drill and punches to free the L5 roots around the L5 pedicles bilaterally. I removed lamina and ligament to fully decompress the L5 roots.  PLIF's were performed at L4/5 in the same fashion. I opened the disc space with a 15 blade then used a variety of instruments to remove the disc and prepare the space for the arthrodesis. I used curettes, rongeurs, punches, shavers for the disc space, and rasps in the discetomy. I measured the disc space and placed 38mm  Peek cages(Synthes) into the disc space(s).  I decorticated the lateral bone at L4, 5. I then placed allograft on the decorticated surfaces to complete the posterolateral arthrodesis.  I placed pedicle screws at L4, and L5, using fluoroscopic guidance. I drilled a pilot hole, then cannulated the pedicle with a drill  at each site. I then tapped each pedicle, assessing each site for pedicle violations. No cutouts were appreciated. Screws Harlin Heys) were then placed at each site without difficulty. Final films were performed and all screws appeared to be in good position. The screws were connected with rods and locking caps. We closed the wound in a layered fashion. We approximated the thoracolumbar fascia, subcutaneous, and subcuticular planes with vicryl sutures. I used dermabond, and a silver occlusive bandage for a sterile dressing.     PLAN OF CARE: Admit to inpatient   PATIENT DISPOSITION:  PACU - hemodynamically stable.   Delay start of Pharmacological VTE agent (>24hrs) due to surgical blood loss or risk of bleeding:  yes

## 2013-11-10 ENCOUNTER — Encounter (HOSPITAL_COMMUNITY): Payer: Self-pay | Admitting: General Practice

## 2013-11-10 NOTE — Progress Notes (Signed)
11/10/13 0900  OT Time Calculation  OT Start Time 0915  OT Stop Time 0955  OT Time Calculation (min) 40 min  OT General Charges  $OT Visit 1 Procedure  OT Evaluation  $Initial OT Evaluation Tier I 1 Procedure  OT Treatments  $Self Care/Home Management  23-37 mins   I agree with the following treatment note after reviewing documentation.   Jeri Modena OTR/L Pager: [phone] Office: [phone] .

## 2013-11-10 NOTE — Progress Notes (Signed)
Foley removed. Pt educated on brace and back precautions. Ambulated in halls. No dizziness or nausea currently.

## 2013-11-10 NOTE — Progress Notes (Signed)
Patient ID: Ashley [name], female   DOB: 03/05/1965, 48 y.o.   MRN: [id number] BP 115/68  Pulse 112  Temp(Src) 97 F (36.1 C) (Oral)  Resp 18  Wt 84.823 kg (187 lb)  SpO2 98% Alert and oriented x 4 Dressing dry Moving lower extremities well Legs do feel better Continue pt/ot
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
## 2013-11-11 MED ORDER — HYDROMORPHONE HCL 1 MG/ML IJ SOLN
1.0000 mg | INTRAMUSCULAR | Status: DC | PRN
  Administered 2013-11-12 (×2): 1 mg via INTRAMUSCULAR
  Filled 2013-11-11 (×2): qty 1

## 2013-11-11 MED ORDER — BISACODYL 5 MG PO TBEC: 5.0000 mg | DELAYED_RELEASE_TABLET | Freq: Every day | ORAL | Status: DC | PRN

## 2013-11-11 MED ORDER — BISACODYL 5 MG PO TBEC
5.0000 mg | DELAYED_RELEASE_TABLET | Freq: Every day | ORAL | Status: DC | PRN
  Administered 2013-11-11: 5 mg via ORAL
  Filled 2013-11-11 (×2): qty 1
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
## 2013-11-11 NOTE — Progress Notes (Signed)
CARE MANAGEMENT NOTE 11/11/2013  Patient:  Ashley [name], Ashley [name]   Account Number:  [id number]  Date Initiated:  11/11/2013  Documentation initiated by:  Lorne Skeens  Subjective/Objective Assessment:   Patient waqs admitted for a PLIF. Lives at home with spouse.     Action/Plan:   Will follow for discharge needs pending PT/OT evals and physician orders.   Anticipated DC Date:     Anticipated DC Plan:           Choice offered to / List presented to:             Status of service:   Medicare Important Message given?  YES (If response is "NO", the following Medicare IM given date fields will be blank) Date Medicare IM given:  11/11/2013 Medicare IM given by:  Lorne Skeens Date Additional Medicare IM given:   Additional Medicare IM given by:    Discharge Disposition:    Per UR Regulation:  Reviewed for med. necessity/level of care/duration of stay  If discussed at [place] of Stay Meetings, dates discussed:    Comments:  11/11/13 [place], MSN, CM- Medicare IM letter provided.
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
## 2013-11-12 MED ORDER — BISACODYL 10 MG RE SUPP
10.0000 mg | Freq: Every day | RECTAL | Status: DC | PRN
  Administered 2013-11-12: 10 mg via RECTAL
  Filled 2013-11-12: qty 1

## 2013-11-12 MED ORDER — OXYCODONE-ACETAMINOPHEN 10-325 MG PO TABS: 1.0000 | ORAL_TABLET | ORAL | Status: DC | PRN

## 2013-11-12 MED ORDER — DIAZEPAM 5 MG PO TABS: 5.0000 mg | ORAL_TABLET | Freq: Four times a day (QID) | ORAL | Status: DC | PRN

## 2013-11-12 NOTE — Discharge Summary (Signed)
  Physician Discharge Summary  Patient ID: Ashley [name] MRN: [id number] DOB/AGE: 1965/11/27 48 y.o.  Admit date: 11/09/2013 Discharge date: 11/12/2013  Admission Diagnoses: Recurrent HNP  Discharge Diagnoses: Same Active Problems:   HNP (herniated nucleus pulposus), lumbar   Discharged Condition: Stable  Hospital Course:  Mrs. Ashley [name] is a 48 y.o. female electively admitted after PLIF. She was at her neurologic baseline postop. Her pain progressively improved, and she was seen by PT/OT and did not require any outpatient f/u at this time. She was voiding normally and tolerating diet.  Treatments: Surgery - PLIF L4-5  Discharge Exam: Blood pressure 114/75, pulse 91, temperature 99.5 F (37.5 C), temperature source Oral, resp. rate 18, weight 84.823 kg (187 lb), SpO2 95.00%. Awake, alert, oriented Speech fluent, appropriate CN grossly intact 5/5 BUE/BLE Wound c/d/i  Follow-up: Follow-up in Dr. Lacy Duverney office [facility] [phone]) in 2-3 weeks  Disposition: 01-Home or Self Care     Medication List    ASK your doctor about these medications       ALPRAZolam 0.5 MG 24 hr tablet  Commonly known as:  XANAX XR  Take 0.5 mg by mouth 2 (two) times daily as needed for anxiety.     cyclobenzaprine 10 MG tablet  Commonly known as:  FLEXERIL  Take 10 mg by mouth at bedtime.     FIORICET 50-325-40 MG per tablet  Generic drug:  butalbital-acetaminophen-caffeine  Take 1 tablet by mouth every 4 (four) hours as needed for headache or migraine.     gabapentin 100 MG capsule  Commonly known as:  NEURONTIN  Take 100 mg by mouth 3 (three) times daily. Take with 800 mg tablet to equal 900 mg.     gabapentin 800 MG tablet  Commonly known as:  NEURONTIN  Take 800 mg by mouth 3 (three) times daily.     oxyCODONE-acetaminophen 5-325 MG per tablet  Commonly known as:  PERCOCET/ROXICET  Take 1 tablet by mouth every 8 (eight) hours as needed for severe  pain.     pantoprazole 40 MG tablet  Commonly known as:  PROTONIX  Take 40 mg by mouth daily.     promethazine 25 MG tablet  Commonly known as:  PHENERGAN  Take 25 mg by mouth every 6 (six) hours as needed for nausea.     propranolol ER 80 MG 24 hr capsule  Commonly known as:  INDERAL LA  Take 80 mg by mouth daily.         SignedConsuella Lose, C 11/12/2013, 10:57 AM
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
## 2013-11-12 NOTE — Progress Notes (Signed)
Patient given DC instructions and prescriptions. Rolling walker and commode delivered to room at DC. All questions answered. Patient ready to be DC to home when family returns. Instructed to use call bell to inform time ready to leave building, so that escort can be provided.

## 2013-11-17 ENCOUNTER — Encounter (HOSPITAL_COMMUNITY): Payer: Self-pay | Admitting: Neurosurgery

## 2013-11-25 ENCOUNTER — Encounter (HOSPITAL_COMMUNITY): Payer: Self-pay | Admitting: Neurosurgery

## 2013-11-28 ENCOUNTER — Encounter (HOSPITAL_COMMUNITY): Payer: Self-pay | Admitting: Neurosurgery

## 2013-11-28 DIAGNOSIS — F324 Major depressive disorder, single episode, in partial remission: Secondary | ICD-10-CM | POA: Diagnosis not present

## 2013-11-28 DIAGNOSIS — E6609 Other obesity due to excess calories: Secondary | ICD-10-CM | POA: Diagnosis not present

## 2013-11-28 DIAGNOSIS — M545 Low back pain: Secondary | ICD-10-CM | POA: Diagnosis not present

## 2013-11-28 DIAGNOSIS — F411 Generalized anxiety disorder: Secondary | ICD-10-CM | POA: Diagnosis not present

## 2013-11-29 DIAGNOSIS — Z6832 Body mass index (BMI) 32.0-32.9, adult: Secondary | ICD-10-CM | POA: Diagnosis not present

## 2013-11-29 DIAGNOSIS — I10 Essential (primary) hypertension: Secondary | ICD-10-CM | POA: Diagnosis not present

## 2013-11-29 DIAGNOSIS — M532X6 Spinal instabilities, lumbar region: Secondary | ICD-10-CM | POA: Diagnosis not present

## 2013-11-29 DIAGNOSIS — M5126 Other intervertebral disc displacement, lumbar region: Secondary | ICD-10-CM | POA: Diagnosis not present

## 2013-12-15 DIAGNOSIS — M2560 Stiffness of unspecified joint, not elsewhere classified: Secondary | ICD-10-CM | POA: Diagnosis not present

## 2013-12-15 DIAGNOSIS — M6281 Muscle weakness (generalized): Secondary | ICD-10-CM | POA: Diagnosis not present

## 2013-12-15 DIAGNOSIS — M545 Low back pain: Secondary | ICD-10-CM | POA: Diagnosis not present

## 2013-12-15 DIAGNOSIS — R202 Paresthesia of skin: Secondary | ICD-10-CM | POA: Diagnosis not present

## 2013-12-29 ENCOUNTER — Telehealth: Payer: Self-pay | Admitting: Neurology

## 2013-12-29 DIAGNOSIS — Z1231 Encounter for screening mammogram for malignant neoplasm of breast: Secondary | ICD-10-CM | POA: Diagnosis not present

## 2013-12-29 DIAGNOSIS — R0683 Snoring: Secondary | ICD-10-CM

## 2013-12-29 DIAGNOSIS — E669 Obesity, unspecified: Secondary | ICD-10-CM

## 2013-12-29 NOTE — Telephone Encounter (Signed)
Patient called and r/s her sleep study for February 06, 2013  I was unable to find the order could you put order in for split night please? Thanks

## 2014-01-23 ENCOUNTER — Encounter: Payer: Self-pay | Admitting: Adult Health

## 2014-01-23 ENCOUNTER — Telehealth: Payer: Self-pay | Admitting: Adult Health

## 2014-01-23 ENCOUNTER — Telehealth: Payer: Self-pay

## 2014-01-23 ENCOUNTER — Ambulatory Visit (INDEPENDENT_AMBULATORY_CARE_PROVIDER_SITE_OTHER): Payer: Medicare Other | Admitting: Adult Health

## 2014-01-23 VITALS — BP 130/86 | HR 91 | Ht 65.0 in | Wt 188.8 lb

## 2014-01-23 DIAGNOSIS — M545 Low back pain, unspecified: Secondary | ICD-10-CM

## 2014-01-23 DIAGNOSIS — M79604 Pain in right leg: Secondary | ICD-10-CM | POA: Diagnosis not present

## 2014-01-23 NOTE — Telephone Encounter (Signed)
I called the patient and provided her with info for assistance program [employer] at [phone] or [email or web address] for Gabapentin.  She will contact them to see if she qualifies and will call us back if anything further is needed.
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
## 2014-01-23 NOTE — Patient Instructions (Signed)

## 2014-01-23 NOTE — Progress Notes (Signed)
I have read the note, and I agree with the clinical assessment and plan.  , Ashley [name]   

## 2014-01-23 NOTE — Telephone Encounter (Signed)
error 

## 2014-01-26 DIAGNOSIS — I889 Nonspecific lymphadenitis, unspecified: Secondary | ICD-10-CM | POA: Diagnosis not present

## 2014-01-30 DIAGNOSIS — M5412 Radiculopathy, cervical region: Secondary | ICD-10-CM | POA: Diagnosis not present

## 2014-01-30 DIAGNOSIS — Z6832 Body mass index (BMI) 32.0-32.9, adult: Secondary | ICD-10-CM | POA: Diagnosis not present

## 2014-01-30 DIAGNOSIS — M546 Pain in thoracic spine: Secondary | ICD-10-CM | POA: Diagnosis not present

## 2014-01-30 DIAGNOSIS — M5126 Other intervertebral disc displacement, lumbar region: Secondary | ICD-10-CM | POA: Diagnosis not present

## 2014-01-30 DIAGNOSIS — I10 Essential (primary) hypertension: Secondary | ICD-10-CM | POA: Diagnosis not present

## 2014-01-31 ENCOUNTER — Other Ambulatory Visit: Payer: Self-pay | Admitting: Neurosurgery

## 2014-01-31 DIAGNOSIS — M546 Pain in thoracic spine: Secondary | ICD-10-CM

## 2014-02-06 ENCOUNTER — Ambulatory Visit (INDEPENDENT_AMBULATORY_CARE_PROVIDER_SITE_OTHER): Payer: Medicare Other | Admitting: Neurology

## 2014-02-06 DIAGNOSIS — G4710 Hypersomnia, unspecified: Secondary | ICD-10-CM

## 2014-02-06 DIAGNOSIS — E669 Obesity, unspecified: Secondary | ICD-10-CM

## 2014-02-06 DIAGNOSIS — R0683 Snoring: Secondary | ICD-10-CM

## 2014-02-06 DIAGNOSIS — G4730 Sleep apnea, unspecified: Principal | ICD-10-CM

## 2014-02-07 NOTE — Sleep Study (Signed)
Please see the scanned sleep study interpretation located in the Procedure tab within the Chart Review section. 

## 2014-02-09 ENCOUNTER — Ambulatory Visit
Admission: RE | Admit: 2014-02-09 | Discharge: 2014-02-09 | Disposition: A | Discharge status: Home / Self Care | Payer: Medicare Other | Source: Ambulatory Visit | Attending: Neurosurgery | Admitting: Neurosurgery

## 2014-02-09 DIAGNOSIS — Z87828 Personal history of other (healed) physical injury and trauma: Secondary | ICD-10-CM | POA: Diagnosis not present

## 2014-02-09 DIAGNOSIS — M546 Pain in thoracic spine: Secondary | ICD-10-CM

## 2014-02-09 DIAGNOSIS — M5124 Other intervertebral disc displacement, thoracic region: Secondary | ICD-10-CM | POA: Diagnosis not present

## 2014-02-15 ENCOUNTER — Telehealth: Payer: Self-pay | Admitting: *Deleted

## 2014-02-15 ENCOUNTER — Encounter: Payer: Self-pay | Admitting: Neurology

## 2014-02-15 NOTE — Telephone Encounter (Signed)
Patient was called and left a detailed message informing her that her sleep study did not reveal any significant sleep apnea and that treatment in the form of CPAP therapy was not advised.  Patient was instructed to follow up with Dr. Jannifer Franklin and Ward Givens as scheduled.  Both were routed a copy of the sleep results.  Patient instructed to contact our office if she had any questions.

## 2014-02-16 ENCOUNTER — Other Ambulatory Visit: Payer: Self-pay | Admitting: Neurosurgery

## 2014-02-16 DIAGNOSIS — M47812 Spondylosis without myelopathy or radiculopathy, cervical region: Secondary | ICD-10-CM

## 2014-02-26 ENCOUNTER — Ambulatory Visit
Admission: RE | Admit: 2014-02-26 | Discharge: 2014-02-26 | Disposition: A | Discharge status: Home / Self Care | Payer: Medicare Other | Source: Ambulatory Visit | Attending: Neurosurgery | Admitting: Neurosurgery

## 2014-02-26 DIAGNOSIS — M5022 Other cervical disc displacement, mid-cervical region: Secondary | ICD-10-CM | POA: Diagnosis not present

## 2014-02-26 DIAGNOSIS — Z981 Arthrodesis status: Secondary | ICD-10-CM | POA: Diagnosis not present

## 2014-02-26 DIAGNOSIS — M5032 Other cervical disc degeneration, mid-cervical region: Secondary | ICD-10-CM | POA: Diagnosis not present

## 2014-02-26 DIAGNOSIS — M47812 Spondylosis without myelopathy or radiculopathy, cervical region: Secondary | ICD-10-CM

## 2014-03-02 DIAGNOSIS — M546 Pain in thoracic spine: Secondary | ICD-10-CM | POA: Diagnosis not present

## 2014-03-02 DIAGNOSIS — Z6832 Body mass index (BMI) 32.0-32.9, adult: Secondary | ICD-10-CM | POA: Diagnosis not present

## 2014-03-02 DIAGNOSIS — M5412 Radiculopathy, cervical region: Secondary | ICD-10-CM | POA: Diagnosis not present

## 2014-03-02 DIAGNOSIS — I10 Essential (primary) hypertension: Secondary | ICD-10-CM | POA: Diagnosis not present

## 2014-03-07 ENCOUNTER — Other Ambulatory Visit (HOSPITAL_COMMUNITY): Payer: Self-pay | Admitting: Neurosurgery

## 2014-03-07 DIAGNOSIS — M542 Cervicalgia: Secondary | ICD-10-CM

## 2014-03-07 DIAGNOSIS — M546 Pain in thoracic spine: Secondary | ICD-10-CM

## 2014-03-15 ENCOUNTER — Other Ambulatory Visit: Payer: Self-pay | Admitting: Adult Health

## 2014-03-15 NOTE — Telephone Encounter (Signed)
I called patient, got no answer.  Left message  

## 2014-03-16 NOTE — Telephone Encounter (Signed)
Last OV note says: She is now having pain in the midthoracic area as well as the neck. She plans on discussing this with Dr. Christella Noa. She is currently taking Percocet and gabapentin for pain (neither prescribed by this office) I called again.  Got no answer.  Left message.

## 2014-03-17 NOTE — Telephone Encounter (Signed)
I called the patient again.  Was not able to reach her.  I called the pharmacy.  Pharmacist said they are contacting the provider who originally prescribed this med for a refill, and will call us back if needed.

## 2014-03-20 ENCOUNTER — Other Ambulatory Visit (HOSPITAL_COMMUNITY): Payer: Self-pay | Admitting: Neurosurgery

## 2014-03-21 ENCOUNTER — Ambulatory Visit (HOSPITAL_COMMUNITY)
Admission: RE | Admit: 2014-03-21 | Discharge: 2014-03-21 | Disposition: A | Discharge status: Home / Self Care | Payer: Medicare Other | Source: Ambulatory Visit | Attending: Neurosurgery | Admitting: Neurosurgery

## 2014-03-21 ENCOUNTER — Encounter (HOSPITAL_COMMUNITY): Payer: Self-pay

## 2014-03-21 VITALS — BP 114/76 | HR 77 | Temp 97.5°F | Resp 18 | Ht 64.0 in | Wt 189.0 lb

## 2014-03-21 DIAGNOSIS — M542 Cervicalgia: Secondary | ICD-10-CM | POA: Insufficient documentation

## 2014-03-21 DIAGNOSIS — Z981 Arthrodesis status: Secondary | ICD-10-CM | POA: Diagnosis not present

## 2014-03-21 DIAGNOSIS — M488X2 Other specified spondylopathies, cervical region: Secondary | ICD-10-CM | POA: Insufficient documentation

## 2014-03-21 DIAGNOSIS — M8938 Hypertrophy of bone, other site: Secondary | ICD-10-CM | POA: Insufficient documentation

## 2014-03-21 DIAGNOSIS — M546 Pain in thoracic spine: Secondary | ICD-10-CM

## 2014-03-21 DIAGNOSIS — M5412 Radiculopathy, cervical region: Secondary | ICD-10-CM | POA: Diagnosis not present

## 2014-03-21 DIAGNOSIS — M5184 Other intervertebral disc disorders, thoracic region: Secondary | ICD-10-CM | POA: Diagnosis not present

## 2014-03-21 MED ORDER — OXYCODONE HCL 5 MG PO TABS
ORAL_TABLET | ORAL | Status: AC
Start: 2014-03-21 — End: 2014-03-21
  Filled 2014-03-21: qty 2

## 2014-03-21 MED ORDER — ONDANSETRON HCL 4 MG/2ML IJ SOLN: 4.0000 mg | Freq: Four times a day (QID) | INTRAMUSCULAR | Status: DC | PRN

## 2014-03-21 MED ORDER — DIAZEPAM 5 MG PO TABS
ORAL_TABLET | ORAL | Status: AC
  Filled 2014-03-21: qty 2

## 2014-03-21 MED ORDER — OXYCODONE HCL 5 MG PO TABS
5.0000 mg | ORAL_TABLET | ORAL | Status: DC | PRN
  Administered 2014-03-21: 10 mg via ORAL

## 2014-03-21 MED ORDER — IOHEXOL 300 MG/ML  SOLN
10.0000 mL | Freq: Once | INTRAMUSCULAR | Status: AC | PRN
  Administered 2014-03-21: 10 mL via INTRATHECAL

## 2014-03-21 MED ORDER — DIAZEPAM 5 MG PO TABS
10.0000 mg | ORAL_TABLET | Freq: Once | ORAL | Status: AC
  Administered 2014-03-21: 10 mg via ORAL

## 2014-03-21 NOTE — Discharge Instructions (Signed)
Myelogram and Lumbar Puncture Discharge Instructions ° °1. Go home and rest quietly for the next 24 hours.  It is important to lie flat for the next 24 hours.  Get up only to go to the restroom.  You may lie in the bed or on a couch on your back, your stomach, your left side or your right side.  You may have one pillow under your head.  You may have pillows between your knees while you are on your side or under your knees while you are on your back. ° °2. DO NOT drive today.  Recline the seat as far back as it will go, while still wearing your seat belt, on the way home. ° °3. You may get up to go to the bathroom as needed.  You may sit up for 10 minutes to eat.  You may resume your normal diet and medications unless otherwise indicated. ° °4. The incidence of headache, nausea, or vomiting is about 5% (one in 20 patients).  If you develop a headache, lie flat and drink plenty of fluids until the headache goes away.  Caffeinated beverages may be helpful.  If you develop severe nausea and vomiting or a headache that does not go away with flat bed rest, call [phone]. ° °5. You may resume normal activities after your 24 hours of bed rest is over; however, do not exert yourself strongly or do any heavy lifting tomorrow. ° °6. Call your physician for a follow-up appointment.  The results of your myelogram will be sent directly to your physician by the following day. ° °7. If you have any questions or if complications develop after you arrive home, please call [phone]. ° °Discharge instructions have been explained to the patient.  The patient, or the person responsible for the patient, fully understands these instructions. ° ° °

## 2014-03-21 NOTE — Op Note (Signed)
03/21/2014 Cervical and thoracic Myelogram  PATIENT:  Ashley [name] is a 49 y.o. female with pain in the neck and mid back.   PRE-OPERATIVE DIAGNOSIS:  Neck and thoracic back pain  POST-OPERATIVE DIAGNOSIS:  same  PROCEDURE:  Lumbar Myelogram  SURGEON:    ANESTHESIA:   local LOCAL MEDICATIONS USED:  LIDOCAINE  and Amount: 5 ml Procedure Note: [name] is a 49 y.o. female Was taken to the fluoroscopy suite and  positioned prone on the fluoroscopy table. Her back was prepared and draped in a sterile manner. I infiltrated 5 cc into the lumbar region. I then introduced a spinal needle into the thecal sac at the L3/4 interlaminar space. I infiltrated 10cc of Omnipaque 300 into the thecal sac. Fluoroscopy showed the needle and contrast in the thecal sac. [name] tolerated the procedure well. she Will be taken to CT for evaluation.     PATIENT DISPOSITION:  Short Stay

## 2014-03-29 DIAGNOSIS — F324 Major depressive disorder, single episode, in partial remission: Secondary | ICD-10-CM | POA: Diagnosis not present

## 2014-03-29 DIAGNOSIS — R05 Cough: Secondary | ICD-10-CM | POA: Diagnosis not present

## 2014-03-29 DIAGNOSIS — F411 Generalized anxiety disorder: Secondary | ICD-10-CM | POA: Diagnosis not present

## 2014-03-29 DIAGNOSIS — E6609 Other obesity due to excess calories: Secondary | ICD-10-CM | POA: Diagnosis not present

## 2014-03-29 DIAGNOSIS — M545 Low back pain: Secondary | ICD-10-CM | POA: Diagnosis not present

## 2014-04-03 DIAGNOSIS — I10 Essential (primary) hypertension: Secondary | ICD-10-CM | POA: Diagnosis not present

## 2014-04-03 DIAGNOSIS — M542 Cervicalgia: Secondary | ICD-10-CM | POA: Diagnosis not present

## 2014-04-03 DIAGNOSIS — M47812 Spondylosis without myelopathy or radiculopathy, cervical region: Secondary | ICD-10-CM | POA: Diagnosis not present

## 2014-04-03 DIAGNOSIS — Z6832 Body mass index (BMI) 32.0-32.9, adult: Secondary | ICD-10-CM | POA: Diagnosis not present

## 2014-04-16 ENCOUNTER — Emergency Department (HOSPITAL_COMMUNITY)
Admission: EM | Admit: 2014-04-16 | Discharge: 2014-04-16 | Disposition: A | Discharge status: Home / Self Care | Payer: Medicare Other | Attending: Emergency Medicine | Admitting: Emergency Medicine

## 2014-04-16 ENCOUNTER — Encounter (HOSPITAL_COMMUNITY): Payer: Self-pay | Admitting: *Deleted

## 2014-04-16 DIAGNOSIS — I10 Essential (primary) hypertension: Secondary | ICD-10-CM | POA: Insufficient documentation

## 2014-04-16 DIAGNOSIS — K219 Gastro-esophageal reflux disease without esophagitis: Secondary | ICD-10-CM | POA: Diagnosis not present

## 2014-04-16 DIAGNOSIS — G8929 Other chronic pain: Secondary | ICD-10-CM | POA: Insufficient documentation

## 2014-04-16 DIAGNOSIS — Z79899 Other long term (current) drug therapy: Secondary | ICD-10-CM | POA: Insufficient documentation

## 2014-04-16 DIAGNOSIS — E669 Obesity, unspecified: Secondary | ICD-10-CM | POA: Diagnosis not present

## 2014-04-16 DIAGNOSIS — M5442 Lumbago with sciatica, left side: Secondary | ICD-10-CM | POA: Diagnosis not present

## 2014-04-16 DIAGNOSIS — R200 Anesthesia of skin: Secondary | ICD-10-CM | POA: Insufficient documentation

## 2014-04-16 DIAGNOSIS — G43909 Migraine, unspecified, not intractable, without status migrainosus: Secondary | ICD-10-CM | POA: Diagnosis not present

## 2014-04-16 DIAGNOSIS — Z91040 Latex allergy status: Secondary | ICD-10-CM | POA: Diagnosis not present

## 2014-04-16 DIAGNOSIS — M545 Low back pain: Secondary | ICD-10-CM | POA: Diagnosis present

## 2014-04-16 DIAGNOSIS — Z9889 Other specified postprocedural states: Secondary | ICD-10-CM | POA: Insufficient documentation

## 2014-04-16 DIAGNOSIS — F419 Anxiety disorder, unspecified: Secondary | ICD-10-CM | POA: Diagnosis not present

## 2014-04-16 DIAGNOSIS — F329 Major depressive disorder, single episode, unspecified: Secondary | ICD-10-CM | POA: Diagnosis not present

## 2014-04-16 DIAGNOSIS — M1990 Unspecified osteoarthritis, unspecified site: Secondary | ICD-10-CM | POA: Insufficient documentation

## 2014-04-16 DIAGNOSIS — Z87828 Personal history of other (healed) physical injury and trauma: Secondary | ICD-10-CM | POA: Diagnosis not present

## 2014-04-16 HISTORY — DX: Other specified postprocedural states: Z98.890

## 2014-04-16 MED ORDER — HYDROMORPHONE HCL 1 MG/ML IJ SOLN
1.0000 mg | Freq: Once | INTRAMUSCULAR | Status: AC
  Administered 2014-04-16: 1 mg via INTRAMUSCULAR
  Filled 2014-04-16: qty 1

## 2014-04-16 MED ORDER — DIAZEPAM 5 MG PO TABS
5.0000 mg | ORAL_TABLET | Freq: Once | ORAL | Status: AC
  Administered 2014-04-16: 5 mg via ORAL
  Filled 2014-04-16: qty 1

## 2014-04-16 MED ORDER — KETOROLAC TROMETHAMINE 60 MG/2ML IM SOLN
60.0000 mg | Freq: Once | INTRAMUSCULAR | Status: AC
  Administered 2014-04-16: 60 mg via INTRAMUSCULAR

## 2014-04-16 MED ORDER — DIAZEPAM 5 MG PO TABS: 5.0000 mg | ORAL_TABLET | Freq: Two times a day (BID) | ORAL | Status: AC

## 2014-04-16 MED ORDER — KETOROLAC TROMETHAMINE 60 MG/2ML IM SOLN
INTRAMUSCULAR | Status: AC
  Filled 2014-04-16: qty 2

## 2014-04-16 MED ORDER — OXYCODONE-ACETAMINOPHEN 10-325 MG PO TABS: 1.0000 | ORAL_TABLET | ORAL | Status: DC | PRN

## 2014-04-16 MED ORDER — HYDROMORPHONE HCL 2 MG/ML IJ SOLN
1.0000 mg | Freq: Once | INTRAMUSCULAR | Status: AC
  Administered 2014-04-16: 1 mg via INTRAMUSCULAR
  Filled 2014-04-16: qty 1

## 2014-04-16 NOTE — ED Notes (Signed)
Pt states she has history of back problems. Her pain started reoccurring last night after watching her grandchild (per pt grandchild was jumping in her lap and pain began after). Pain starts on left side and moves down left leg and through the groin area. Pt states he took a percocet at 0300 with no relief. NAD noted. VS stable.

## 2014-04-16 NOTE — Discharge Instructions (Signed)
As discussed, with her history of low back disease, and your new pain it is very important to follow-up with your physicians for appropriate ongoing management.  Please be sure to call tomorrow to arrange expeditious repeat evaluation.  Return here for concerning changes in your condition.
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
## 2014-04-18 ENCOUNTER — Ambulatory Visit (INDEPENDENT_AMBULATORY_CARE_PROVIDER_SITE_OTHER): Payer: Medicare Other | Admitting: Adult Health

## 2014-04-18 ENCOUNTER — Telehealth: Payer: Self-pay

## 2014-04-18 ENCOUNTER — Encounter: Payer: Self-pay | Admitting: Adult Health

## 2014-04-18 VITALS — BP 150/100 | HR 82 | Ht 65.0 in | Wt 190.0 lb

## 2014-04-18 DIAGNOSIS — M5442 Lumbago with sciatica, left side: Secondary | ICD-10-CM | POA: Diagnosis not present

## 2014-04-18 MED ORDER — GABAPENTIN 800 MG PO TABS: 800.0000 mg | ORAL_TABLET | Freq: Four times a day (QID) | ORAL | Status: DC

## 2014-04-18 NOTE — Patient Instructions (Signed)
Increase gabapentin 800 mg four times a day I will order MRI lumbar spine.

## 2014-04-18 NOTE — Telephone Encounter (Signed)
Called patient and left a voice mail that we received her faxed copy of her MRI Results. Jinny Blossom will be back in the office on Wednesday.04-19-14

## 2014-04-18 NOTE — Progress Notes (Signed)
I have read the note, and I agree with the clinical assessment and plan.  WILLIS,CHARLES KEITH   
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
## 2014-04-20 ENCOUNTER — Telehealth: Payer: Self-pay | Admitting: Adult Health

## 2014-04-20 NOTE — Telephone Encounter (Signed)
Patient is returning a call regarding MRI results.  °

## 2014-04-20 NOTE — Telephone Encounter (Signed)
Called patient and left a message with more detail please return my call.

## 2014-04-21 ENCOUNTER — Telehealth: Payer: Self-pay

## 2014-04-21 NOTE — Telephone Encounter (Signed)
I called the patient and left a message on her home/mobile number. She can call our office back for results.

## 2014-04-21 NOTE — Telephone Encounter (Signed)
Patient calling for MRI results.

## 2014-04-24 ENCOUNTER — Telehealth: Payer: Self-pay | Admitting: Adult Health

## 2014-04-24 DIAGNOSIS — M5442 Lumbago with sciatica, left side: Secondary | ICD-10-CM

## 2014-04-24 NOTE — Telephone Encounter (Signed)
I called the patient. Her MRI of the lumbar spine did not show any acute changes. She does have scar tissue around the left S1 nerve root. She has continued to have low back pain with shooting pain down the left leg. I consulted with Dr. Jannifer Franklin and we will order epidural steroid injection. Patient is amendable to this.

## 2014-04-29 ENCOUNTER — Telehealth: Payer: Self-pay | Admitting: Neurology

## 2014-04-29 MED ORDER — DEXAMETHASONE 2 MG PO TABS: ORAL_TABLET | ORAL | Status: DC

## 2014-04-29 NOTE — Telephone Encounter (Signed)
830 am:   Answering service called:  More pain down leg.   Called back.. No answer, mailbox full  835:   Reached Ashley [name] - more leg pain    Called in steroid pack, advised to take muscle relaxants (she has).    ESI has been ordered but she has not yet scheduled.  Advised to do so.

## 2014-05-01 NOTE — Telephone Encounter (Signed)
Noted. Thanks.

## 2014-05-03 ENCOUNTER — Ambulatory Visit: Payer: Medicare Other | Admitting: Adult Health

## 2014-05-09 ENCOUNTER — Telehealth: Payer: Self-pay | Admitting: Adult Health

## 2014-05-09 ENCOUNTER — Ambulatory Visit: Payer: Medicare Other | Admitting: Adult Health

## 2014-05-09 ENCOUNTER — Other Ambulatory Visit: Payer: Medicare Other

## 2014-05-09 NOTE — Telephone Encounter (Signed)
Patient no showed for a revisit appointment.  

## 2014-05-11 ENCOUNTER — Encounter: Payer: Self-pay | Admitting: Adult Health

## 2014-05-16 ENCOUNTER — Other Ambulatory Visit: Payer: Medicare Other

## 2014-07-11 DIAGNOSIS — F331 Major depressive disorder, recurrent, moderate: Secondary | ICD-10-CM | POA: Diagnosis not present

## 2014-07-11 DIAGNOSIS — N63 Unspecified lump in breast: Secondary | ICD-10-CM | POA: Diagnosis not present

## 2014-07-11 DIAGNOSIS — E6609 Other obesity due to excess calories: Secondary | ICD-10-CM | POA: Diagnosis not present

## 2014-07-11 DIAGNOSIS — G43809 Other migraine, not intractable, without status migrainosus: Secondary | ICD-10-CM | POA: Diagnosis not present

## 2014-07-11 DIAGNOSIS — F324 Major depressive disorder, single episode, in partial remission: Secondary | ICD-10-CM | POA: Diagnosis not present

## 2014-07-11 DIAGNOSIS — F411 Generalized anxiety disorder: Secondary | ICD-10-CM | POA: Diagnosis not present

## 2014-07-11 DIAGNOSIS — Z1389 Encounter for screening for other disorder: Secondary | ICD-10-CM | POA: Diagnosis not present

## 2014-07-11 DIAGNOSIS — R072 Precordial pain: Secondary | ICD-10-CM | POA: Diagnosis not present

## 2014-07-11 DIAGNOSIS — M545 Low back pain: Secondary | ICD-10-CM | POA: Diagnosis not present

## 2014-07-11 DIAGNOSIS — R05 Cough: Secondary | ICD-10-CM | POA: Diagnosis not present

## 2014-07-14 DIAGNOSIS — I10 Essential (primary) hypertension: Secondary | ICD-10-CM | POA: Diagnosis not present

## 2014-07-14 DIAGNOSIS — Z8249 Family history of ischemic heart disease and other diseases of the circulatory system: Secondary | ICD-10-CM | POA: Diagnosis not present

## 2014-07-14 DIAGNOSIS — M797 Fibromyalgia: Secondary | ICD-10-CM | POA: Diagnosis not present

## 2014-07-14 DIAGNOSIS — R079 Chest pain, unspecified: Secondary | ICD-10-CM | POA: Diagnosis not present

## 2014-07-14 DIAGNOSIS — F329 Major depressive disorder, single episode, unspecified: Secondary | ICD-10-CM | POA: Diagnosis not present

## 2014-07-14 DIAGNOSIS — R51 Headache: Secondary | ICD-10-CM | POA: Diagnosis not present

## 2014-07-19 DIAGNOSIS — N63 Unspecified lump in breast: Secondary | ICD-10-CM | POA: Diagnosis not present

## 2014-07-27 DIAGNOSIS — I10 Essential (primary) hypertension: Secondary | ICD-10-CM | POA: Diagnosis not present

## 2014-07-27 DIAGNOSIS — M797 Fibromyalgia: Secondary | ICD-10-CM | POA: Diagnosis not present

## 2014-07-27 DIAGNOSIS — I499 Cardiac arrhythmia, unspecified: Secondary | ICD-10-CM | POA: Diagnosis not present

## 2014-07-27 DIAGNOSIS — Z91040 Latex allergy status: Secondary | ICD-10-CM | POA: Diagnosis not present

## 2014-07-27 DIAGNOSIS — F418 Other specified anxiety disorders: Secondary | ICD-10-CM | POA: Diagnosis not present

## 2014-07-27 DIAGNOSIS — T63441A Toxic effect of venom of bees, accidental (unintentional), initial encounter: Secondary | ICD-10-CM | POA: Diagnosis not present

## 2014-07-27 DIAGNOSIS — Z885 Allergy status to narcotic agent status: Secondary | ICD-10-CM | POA: Diagnosis not present

## 2014-07-27 DIAGNOSIS — Z888 Allergy status to other drugs, medicaments and biological substances status: Secondary | ICD-10-CM | POA: Diagnosis not present

## 2014-07-27 DIAGNOSIS — Z79899 Other long term (current) drug therapy: Secondary | ICD-10-CM | POA: Diagnosis not present

## 2014-07-27 DIAGNOSIS — F4310 Post-traumatic stress disorder, unspecified: Secondary | ICD-10-CM | POA: Diagnosis not present

## 2014-07-27 DIAGNOSIS — Z8249 Family history of ischemic heart disease and other diseases of the circulatory system: Secondary | ICD-10-CM | POA: Diagnosis not present

## 2014-07-27 DIAGNOSIS — Z9049 Acquired absence of other specified parts of digestive tract: Secondary | ICD-10-CM | POA: Diagnosis not present

## 2014-07-27 DIAGNOSIS — Z90710 Acquired absence of both cervix and uterus: Secondary | ICD-10-CM | POA: Diagnosis not present

## 2014-08-01 ENCOUNTER — Encounter: Payer: Self-pay | Admitting: Adult Health

## 2014-08-01 ENCOUNTER — Ambulatory Visit (INDEPENDENT_AMBULATORY_CARE_PROVIDER_SITE_OTHER): Payer: Medicare Other | Admitting: Adult Health

## 2014-08-01 VITALS — BP 134/89 | HR 72 | Ht 65.0 in | Wt 179.0 lb

## 2014-08-01 DIAGNOSIS — G479 Sleep disorder, unspecified: Secondary | ICD-10-CM | POA: Diagnosis not present

## 2014-08-01 DIAGNOSIS — M545 Low back pain: Secondary | ICD-10-CM | POA: Diagnosis not present

## 2014-08-01 NOTE — Patient Instructions (Signed)
If you want to try epidural steriod injection let us know Try taking xanax as prescribed at night to help with anxiety Can try melatonin 3 mg at bedtime to help with sleep.
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
## 2014-08-01 NOTE — Progress Notes (Signed)
I have read the note, and I agree with the clinical assessment and plan.  , Ashley [name]   

## 2014-08-11 ENCOUNTER — Ambulatory Visit: Payer: Medicare Other | Admitting: Adult Health

## 2014-09-21 DIAGNOSIS — Z202 Contact with and (suspected) exposure to infections with a predominantly sexual mode of transmission: Secondary | ICD-10-CM | POA: Diagnosis not present

## 2014-09-21 DIAGNOSIS — R300 Dysuria: Secondary | ICD-10-CM | POA: Diagnosis not present

## 2014-09-21 DIAGNOSIS — Z209 Contact with and (suspected) exposure to unspecified communicable disease: Secondary | ICD-10-CM | POA: Diagnosis not present

## 2014-09-21 DIAGNOSIS — N760 Acute vaginitis: Secondary | ICD-10-CM | POA: Diagnosis not present

## 2014-10-17 DIAGNOSIS — I10 Essential (primary) hypertension: Secondary | ICD-10-CM | POA: Diagnosis not present

## 2014-10-17 DIAGNOSIS — M47812 Spondylosis without myelopathy or radiculopathy, cervical region: Secondary | ICD-10-CM | POA: Diagnosis not present

## 2014-10-17 DIAGNOSIS — M532X6 Spinal instabilities, lumbar region: Secondary | ICD-10-CM | POA: Diagnosis not present

## 2014-10-17 DIAGNOSIS — M5412 Radiculopathy, cervical region: Secondary | ICD-10-CM | POA: Diagnosis not present

## 2014-10-24 NOTE — Patient Outreach (Signed)
[facility]) Care Management  10/24/2014  Ashley [name] [birth year] [id number]   Referral from MD, assigned Mariann Laster, RN to outreach for [name].  Thanks, [name]. [facility], [place] Assistant Phone: [phone] Fax: [phone]

## 2014-10-27 ENCOUNTER — Other Ambulatory Visit: Payer: Self-pay
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
## 2014-10-27 NOTE — Patient Outreach (Signed)
[facility]) Care Management  10/27/2014  Ashley [name] [birth year] [id number]   Telephonic Screening Note  Outreach attempted call #1 to patient.  Patient not reached at [phone]. RN CM left HIPAA compliant voice message with name and number requesting call back.  RN CM will reschedule for next attempt.   Mariann Laster, RN, BSN, [facility], CCM  [employer] Management Coordinator [phone] Direct [phone] Cell [phone] Office [phone] Fax

## 2014-10-31 ENCOUNTER — Ambulatory Visit: Payer: Medicare Other

## 2014-11-09 ENCOUNTER — Ambulatory Visit: Payer: Self-pay

## 2014-11-13 DIAGNOSIS — F324 Major depressive disorder, single episode, in partial remission: Secondary | ICD-10-CM | POA: Diagnosis not present

## 2014-11-13 DIAGNOSIS — R05 Cough: Secondary | ICD-10-CM | POA: Diagnosis not present

## 2014-11-13 DIAGNOSIS — R300 Dysuria: Secondary | ICD-10-CM | POA: Diagnosis not present

## 2014-11-13 DIAGNOSIS — M545 Low back pain: Secondary | ICD-10-CM | POA: Diagnosis not present

## 2014-11-13 DIAGNOSIS — F411 Generalized anxiety disorder: Secondary | ICD-10-CM | POA: Diagnosis not present

## 2014-11-13 DIAGNOSIS — G43809 Other migraine, not intractable, without status migrainosus: Secondary | ICD-10-CM | POA: Diagnosis not present

## 2014-11-13 DIAGNOSIS — Z23 Encounter for immunization: Secondary | ICD-10-CM | POA: Diagnosis not present

## 2014-11-13 DIAGNOSIS — E6609 Other obesity due to excess calories: Secondary | ICD-10-CM | POA: Diagnosis not present

## 2014-11-15 ENCOUNTER — Ambulatory Visit: Payer: Self-pay

## 2014-11-24 ENCOUNTER — Ambulatory Visit: Payer: Self-pay

## 2014-11-28 ENCOUNTER — Other Ambulatory Visit: Payer: Self-pay
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
## 2014-12-04 ENCOUNTER — Telehealth: Payer: Self-pay | Admitting: Neurology

## 2014-12-04 NOTE — Telephone Encounter (Signed)
Returned call. No answer.  

## 2014-12-04 NOTE — Telephone Encounter (Signed)
Spoke to patient. Advised would need to contact Dr. Lacy Duverney office to request refill of pain med he originally prescribed. Patient agreed.

## 2014-12-04 NOTE — Telephone Encounter (Signed)
Patient returned call

## 2014-12-04 NOTE — Telephone Encounter (Signed)
Patient is calling and states she needs pain medication for low back pain.  Please call.

## 2014-12-11 DIAGNOSIS — M722 Plantar fascial fibromatosis: Secondary | ICD-10-CM | POA: Diagnosis not present

## 2014-12-11 DIAGNOSIS — M79672 Pain in left foot: Secondary | ICD-10-CM | POA: Diagnosis not present

## 2014-12-11 DIAGNOSIS — M25579 Pain in unspecified ankle and joints of unspecified foot: Secondary | ICD-10-CM | POA: Diagnosis not present

## 2014-12-25 DIAGNOSIS — M722 Plantar fascial fibromatosis: Secondary | ICD-10-CM | POA: Diagnosis not present

## 2014-12-25 DIAGNOSIS — M79672 Pain in left foot: Secondary | ICD-10-CM | POA: Diagnosis not present

## 2014-12-26 ENCOUNTER — Ambulatory Visit: Payer: Self-pay

## 2014-12-27 ENCOUNTER — Ambulatory Visit (INDEPENDENT_AMBULATORY_CARE_PROVIDER_SITE_OTHER): Payer: Medicare Other | Admitting: Adult Health

## 2014-12-27 ENCOUNTER — Encounter: Payer: Self-pay | Admitting: Adult Health

## 2014-12-27 ENCOUNTER — Ambulatory Visit: Payer: Self-pay

## 2014-12-27 VITALS — BP 153/90 | HR 65 | Ht 64.0 in | Wt 181.5 lb

## 2014-12-27 DIAGNOSIS — M549 Dorsalgia, unspecified: Secondary | ICD-10-CM

## 2014-12-27 DIAGNOSIS — M79672 Pain in left foot: Secondary | ICD-10-CM

## 2014-12-27 DIAGNOSIS — G8929 Other chronic pain: Secondary | ICD-10-CM
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
## 2014-12-27 NOTE — Patient Instructions (Signed)
We will do Nerve conduction studies with EMG on both legs If your symptoms worsen or you develop new symptoms please let us know.

## 2014-12-27 NOTE — Progress Notes (Signed)
I have read the note, and I agree with the clinical assessment and plan.  , Ashley [name]   

## 2014-12-28 ENCOUNTER — Other Ambulatory Visit: Payer: Self-pay | Admitting: Adult Health

## 2014-12-28 ENCOUNTER — Other Ambulatory Visit: Payer: Self-pay

## 2014-12-28 DIAGNOSIS — I10 Essential (primary) hypertension: Secondary | ICD-10-CM
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
## 2014-12-29 ENCOUNTER — Other Ambulatory Visit: Payer: Self-pay | Admitting: Licensed Clinical Social Worker

## 2014-12-29 NOTE — Patient Outreach (Signed)
Assessment:  CSW received referral on Ashley [name] on 12/29/14.  Ashley Laster, RN, sent referral to [place] regarding client need of a financial assessment and need for digital blood pressure cuff.  Client sees Dr. Quintin Alto as primary doctor.  Client has an involved mental health history but has reported to [place] that client had missed her last mental health appointment in October of 2016.  Ashley [name] has talked with client about importance of client attending scheduled mental health appointments.  Client is separated but helps to care for the personal care needs of her husband. Client lives with her 47 year old grandmother.  Client receives Social Security Disability benefit.  CSW will telephone client next week to talk with client further about financial needs of client.  Plan: CSW to collaborate with RN Ashley [name] in monitoring needs of client.  Ashley [name]. MSW, LCSW Licensed Clinical Social Worker [facility] Care Management [phone]

## 2014-12-29 NOTE — Addendum Note (Signed)
Addended by: Standley Brooking on: 12/29/2014 04:06 PM   Modules accepted: Medications

## 2015-01-03 ENCOUNTER — Encounter: Payer: Self-pay | Admitting: Neurology

## 2015-01-03 ENCOUNTER — Ambulatory Visit (INDEPENDENT_AMBULATORY_CARE_PROVIDER_SITE_OTHER): Payer: Medicare Other | Admitting: Neurology

## 2015-01-03 ENCOUNTER — Ambulatory Visit (INDEPENDENT_AMBULATORY_CARE_PROVIDER_SITE_OTHER): Payer: Self-pay | Admitting: Neurology

## 2015-01-03 DIAGNOSIS — M79672 Pain in left foot: Secondary | ICD-10-CM

## 2015-01-03 DIAGNOSIS — M545 Low back pain: Secondary | ICD-10-CM

## 2015-01-03 DIAGNOSIS — M5417 Radiculopathy, lumbosacral region: Secondary | ICD-10-CM

## 2015-01-03 DIAGNOSIS — Z5181 Encounter for therapeutic drug level monitoring: Secondary | ICD-10-CM

## 2015-01-03 NOTE — Progress Notes (Signed)
Ashley [name] is a 49 year old patient with a history of prior lumbosacral spine surgery. She has had chronic pain following surgery. The patient has recently fallen in October 2016 when she was walking the dogs. She fell backwards, and she has had significant issues with pain in the left foot since that time. She continues to have chronic low back pain. She returns for EMG and nerve conduction study evaluation.  Nerve conduction studies on both legs were unremarkable. EMG of the left leg is most consistent with an acute and chronic S1 radiculopathy.  The patient will be sent for MRI evaluation of the lumbar spine. She does not wish to pursue epidural steroid injections. In the past, she indicates that these procedures have been ineffective.

## 2015-01-03 NOTE — Procedures (Signed)
     HISTORY:  Ashley [name] is a 49 year old patient with a history of prior lumbosacral spine surgery. The patient has had a chronic pain syndrome following surgery. In October 2016, the patient fell backwards while walking her dogs, she has had onset of significant discomfort involving the left foot, primarily on the sole of the foot. She has seen a podiatrist who indicated that she may have a radicular pain syndrome rather than a plantar fasciitis. The patient is being evaluated for this issue.  NERVE CONDUCTION STUDIES:  Nerve conduction studies were performed on both lower extremities. The distal motor latencies and motor amplitudes for the peroneal and posterior tibial nerves were within normal limits. The nerve conduction velocities for these nerves were also normal. The H reflex latencies were normal. The sensory latencies for the peroneal nerves were within normal limits. The medial and lateral plantar sensory latencies were symmetric and within normal limits bilaterally.   EMG STUDIES:  EMG study was performed on the left lower extremity:  The tibialis anterior muscle reveals 2 to 4K motor units with full recruitment. No fibrillations or positive waves were seen. The peroneus tertius muscle reveals 2 to 4K motor units with full recruitment. No fibrillations or positive waves were seen. The medial gastrocnemius muscle reveals 1 to 3K motor units with decreased recruitment. 2+ fibrillations and positive waves were seen. The vastus lateralis muscle reveals 2 to 4K motor units with full recruitment. No fibrillations or positive waves were seen. The iliopsoas muscle reveals 2 to 4K motor units with full recruitment. No fibrillations or positive waves were seen. The biceps femoris muscle (long head) reveals 3 to 6K motor units with decreased recruitment. No fibrillations or positive waves were seen. The lumbosacral paraspinal muscles were tested at 3 levels, and revealed no abnormalities of  insertional activity at the upper and middle levels tested. 2+ fibrillations and positive waves were seen at the lower level. There was good relaxation.   IMPRESSION:  Nerve conduction studies done on both lower extremities were unremarkable. No evidence of a peripheral neuropathy or evidence of tarsal tunnel syndrome was seen. EMG evaluation of the left lower extremity shows findings most consistent with a left S1 radiculopathy with acute and chronic features. No other significant abnormalities were seen.  [name] MD 01/03/2015 4:40 PM  [facility] [address] [place], [place] [address]  Phone [phone] Fax [phone]

## 2015-01-03 NOTE — Progress Notes (Signed)
Please refer to EMG and nerve conduction study procedure note. 

## 2015-01-04 LAB — BASIC METABOLIC PANEL
BUN / CREAT RATIO: 21 (ref 9–23)
BUN: 16 mg/dL (ref 6–24)
CHLORIDE: 109 mmol/L — AB (ref 97–106)
CO2: 20 mmol/L (ref 18–29)
Calcium: 8.7 mg/dL (ref 8.7–10.2)
Creatinine, Ser: 0.75 mg/dL (ref 0.57–1.00)
GFR calc non Af Amer: 94 mL/min/{1.73_m2} (ref >59)
GFR, EST AFRICAN AMERICAN: 108 mL/min/{1.73_m2} (ref >59)
GLUCOSE: 90 mg/dL (ref 65–99)
POTASSIUM: 4.2 mmol/L (ref 3.5–5.2)
SODIUM: 138 mmol/L (ref 136–144)

## 2015-01-05 ENCOUNTER — Other Ambulatory Visit: Payer: Self-pay | Admitting: Licensed Clinical Social Worker

## 2015-01-05 ENCOUNTER — Encounter: Payer: Self-pay | Admitting: Licensed Clinical Social Worker

## 2015-01-05 NOTE — Patient Outreach (Signed)
Assessment:  CSW received referral on client, Ashley [name].  CSW called client on 01/05/15 and spoke via phone with client. CSW verified identity of client. CSW introduced self to client.  CSW received verbal permission from client on 01/05/15 for CSW to talk with client about client's current needs.  Client has Medicare coverage and said she had [employer] for prescription medications.  She said she has pain in lower back and in left lower leg area.  She said she is taking prescribed pain medication.  She had a fall on October 28, 2014. She said she has had 4 surgeries on her back.  She said she has her prescribed medications and is taking medications as prescribed; but, she is fearful that her needed prescribed medications may go up in price starting in January of 2017.  She said Gabapentin is expensive to obtain.  She has frequent headaches.  She uses her monthly income to pay her expenses.  She said she gets food stamps (small amount each month).  She said she goes to [employer] once a month in [place], [place]. She said she goes to food pantry in [place], [place] also  to receive food assistance support. She also goes to [employer] periodically for food support.  She has financial challenges with mortgage payment. CSW and client completed [facility] Financial Assessment Form for client on 01/05/15.  Again, client stated that she is concerned that some of her medications costs wtll go up soon  and it may cost more for her prescribed medications starting in January of 2017.  She has reduced support system.  She earns too much income to qualify for Medicaid benefit.  She is interested in getting a digital blood pressure cuff (regular sized adult cuff).  She was unsure of cost of such a cuff. She said she drives her car as needed to medical appointments and to do daily errands..  She said she sometimes stays more at home than in previous months. CSW talked briefly with client about social isolation  tendencies that may occur with client. Client agreed that she sometimes is isolating herslef now more than in previous months. She said she previously attended counseling support at [facility] in [place], [place]; however, that program is no longer accepting her insurance coverage. She said she did go to that agency to speak with representative. She was told that now it would cost her about $90.00 per visit for her to go to [facility] counseling sessions She said she could not afford to pay this amount.   She said she spends a good bit of time each day caring for her husband, who resides in a separate residence nearby.  She said that caring for husband is causing her to be tired emotionally and physically fatigued.  CSW talked with client about relaxation techniques for client.  She said she likes to relax by watching TV or listening to music.  She said pain issues are chronic and it is hard for her to concentrate with daily pain issues faced. Again she said she takes pain medication as prescribed.  She is appreciative of nursing telephonic support with Chrystal Hutchinson.RN.  CSW talked with client about [facility] consent form.  Client said she thought she may have lost [facility] consent form mailed to her. CSW informed client that another [facility] consent form would be mailed to client to review. Client was very appreciative of support from [facility] staff.  CSW talked with client about care  plan.  CSW encouraged client to care for herself by using relaxation techniques for client.  CSW encouraged client to watch TV or listen to music as relaxation method.  CSW gave client [facility] CSW number of [phone] and encouraged her to call CSW to further discuss social work needs of client.  CSW thanked client for phone conversation with CSW on 01/05/15.  Plan: Client to use relaxation techniques of watching TV or listening to music as ways of managing stress and anxiety. CSW to communicate above information to [employer]. CSW to call client in two weeks to assess needs of client.  [name]. MSW, LCSW Licensed Clinical Social Worker [facility] Care Management [phone]

## 2015-01-08 ENCOUNTER — Encounter: Payer: Self-pay | Admitting: Licensed Clinical Social Worker

## 2015-01-15 ENCOUNTER — Telehealth: Payer: Self-pay | Admitting: Neurology

## 2015-01-15 ENCOUNTER — Ambulatory Visit (INDEPENDENT_AMBULATORY_CARE_PROVIDER_SITE_OTHER): Payer: Self-pay

## 2015-01-15 DIAGNOSIS — M79605 Pain in left leg: Secondary | ICD-10-CM | POA: Diagnosis not present

## 2015-01-15 DIAGNOSIS — M79672 Pain in left foot: Secondary | ICD-10-CM | POA: Diagnosis not present

## 2015-01-15 DIAGNOSIS — Z0289 Encounter for other administrative examinations: Secondary | ICD-10-CM

## 2015-01-15 NOTE — Telephone Encounter (Signed)
I called back , left a message regarding the MRI. The study shows what appears to be some impingement of the left S1 nerve root, correlating well with the EMG findings. The patient may need contact her surgeon about these findings, if the back and leg pain is not improving. She has had prior surgeryat the L4-5 level.

## 2015-01-15 NOTE — Telephone Encounter (Signed)
     MRI lumbar 01/15/2015:  IMPRESSION: This is an abnormal MRI of the lumbar spine with and without contrast showed the following: 1. At L4-L5, there has been decompressive surgery and fusion since the last MRI dated 04/20/2013. Although there is no longer significant foraminal or lateral recess stenosis, there is some enhancing material around the L5 nerve roots within the lateral recesses consistent with epidural fibrosis. 2. At L5-S1, there is a small left paramedian disc protrusion causing left lateral recess stenosis and posterior displacement of the left S1 nerve root. Additionally, there is some enhancement surrounding the left S1 nerve root consistent with epidural fibrosis. This level is essentially unchanged when compared to the previous MRI. 3. Minimal degenerative changes at L3-L4 that did not lead to any nerve root impingement.

## 2015-01-19 ENCOUNTER — Other Ambulatory Visit: Payer: Self-pay | Admitting: Licensed Clinical Social Worker

## 2015-01-19 NOTE — Patient Outreach (Signed)
Assessment:  CSW called client home phone number on 01/19/15.  CSW spoke with client via phone on 01/19/15.  CSW verified identity of client. Client said she has chronic pain in her back.  She said she has appointment on 02/12/15 to meet with surgeon.  This appointment was recommended for client by Dr. Jannifer [place], neurologist.  Client said she has her prescribed medications and is taking medications as prescribed. She obtains her prescribed medications from [facility] in [place], [place].  Client said she has had 4 surgeries for her back since 2010. She said she receives Food Stamps benefit (small amount each month).  She said she has adequate food supply at present.  She said she went to [employer] recently for food assistance.  She and CSW spoke of fact that her blood pressure fluctuates. She said she would like to obain a digital blood pressure cuff to help her monitor her blood pressure readings. .  She said she fell on  October 28, 2014. She has had shoulder pain in left shoulder since her fall on October 28, 2014.  She is sleeping well.  Client said she is attending a friends support group in [place], [place] in home of friends. This group provides socialization, emotional support and spiritual support for client. She said group has 8 participants at present and had been very helpful to her..  She said she has attended 3 of these group meetings. CSW encouraged her to continue attending this support group and also talked with her about social, emotional and spiritual support through that group.  She said her spouse had been receiving respite care for the past week and that this had been helpful to client.  Dorrene said she would go today to facility where spouse had received respite care to pick him up and transport spouse back to home of spouse  Zuleika spoke of pain issues in her back and in her left shoulder. She said she planned to talk with surgeon on 02/12/15 about pain issues of client.  CSW and client spoke of client  care plan. CSW encouraged client to use relaxation techniques to help her manage anxiety and stress. She said she likes to watch TV, listen to music, cook, or take a walk to relax.  CSW encouraged client to take time to care for herself by using relaxation techniques.  CSW informed M[place] that [place] would consult with Crystal Hutchinson RN to see if [place] thought a [place] consult would be useful for client at this time or not.  Client is concerned over medication costs and thinks her medications will cost her more beginning in January of 2017. She said she will call her pharmacist at [facility] in [place], [place] to talk with her pharmacist about possible costs for her medications beginning in 2017.  CSW thanked Elika for phone conversation with CSW on 01/19/15. Jeania was very appreciative of phone call from [place] on 01/19/15.   Plan: Client to continue to use relaxation techniques to help her manage stress and anxiety. CSW to call client in three weeks to assess needs of client at that time.  [name]. MSW, LCSW Licensed Clinical Social Worker  [facility] Care Management [phone] .

## 2015-01-25 ENCOUNTER — Ambulatory Visit: Payer: Self-pay

## 2015-01-26 ENCOUNTER — Ambulatory Visit: Payer: Self-pay

## 2015-01-30 DIAGNOSIS — G43809 Other migraine, not intractable, without status migrainosus: Secondary | ICD-10-CM | POA: Diagnosis not present

## 2015-01-30 DIAGNOSIS — R0789 Other chest pain: Secondary | ICD-10-CM | POA: Diagnosis not present

## 2015-01-30 DIAGNOSIS — F324 Major depressive disorder, single episode, in partial remission: Secondary | ICD-10-CM | POA: Diagnosis not present

## 2015-01-30 DIAGNOSIS — E6609 Other obesity due to excess calories: Secondary | ICD-10-CM | POA: Diagnosis not present

## 2015-01-30 DIAGNOSIS — F411 Generalized anxiety disorder: Secondary | ICD-10-CM | POA: Diagnosis not present

## 2015-01-30 DIAGNOSIS — M545 Low back pain: Secondary | ICD-10-CM | POA: Diagnosis not present

## 2015-02-01 ENCOUNTER — Encounter: Payer: Medicare Other | Admitting: Neurology

## 2015-02-02 ENCOUNTER — Other Ambulatory Visit: Payer: Self-pay

## 2015-02-02 NOTE — Patient Outreach (Signed)
[facility]) Care Management  02/02/2015  Ashley [name] Dec 14, 1965 [id number]   Outreach call #1 to patient for telephonic monthly assessment.  Patient not reached.  RN CM left HIPAA compliant voice message with name and contact # for call back.  RN CM will schedule for next outreach call within one week.   Mariann Laster, RN, BSN, [facility], CCM  [employer] Management Coordinator [phone] Direct [phone] Cell [phone] Office [phone] Fax

## 2015-02-05 ENCOUNTER — Ambulatory Visit: Payer: Medicare Other | Admitting: Neurology

## 2015-02-06 ENCOUNTER — Encounter: Payer: Self-pay | Admitting: Neurology

## 2015-02-06 ENCOUNTER — Ambulatory Visit (INDEPENDENT_AMBULATORY_CARE_PROVIDER_SITE_OTHER): Payer: Medicare Other | Admitting: Neurology

## 2015-02-06 ENCOUNTER — Other Ambulatory Visit: Payer: Self-pay

## 2015-02-06 VITALS — BP 152/98 | HR 74 | Ht 64.0 in | Wt 179.5 lb

## 2015-02-06 DIAGNOSIS — M797 Fibromyalgia: Secondary | ICD-10-CM

## 2015-02-06 DIAGNOSIS — R159 Full incontinence of feces: Secondary | ICD-10-CM | POA: Diagnosis not present

## 2015-02-06 DIAGNOSIS — R519 Headache, unspecified: Secondary | ICD-10-CM

## 2015-02-06 DIAGNOSIS — G441 Vascular headache, not elsewhere classified: Secondary | ICD-10-CM

## 2015-02-06 DIAGNOSIS — M5417 Radiculopathy, lumbosacral region: Secondary | ICD-10-CM

## 2015-02-06 DIAGNOSIS — G4486 Cervicogenic headache: Secondary | ICD-10-CM | POA: Insufficient documentation

## 2015-02-06 DIAGNOSIS — R51 Headache: Secondary | ICD-10-CM

## 2015-02-06 HISTORY — DX: Headache, unspecified: R51.9

## 2015-02-06 HISTORY — DX: Radiculopathy, lumbosacral region: M54.17

## 2015-02-06 MED ORDER — VENLAFAXINE HCL ER 150 MG PO CP24: 150.0000 mg | ORAL_CAPSULE | Freq: Every day | ORAL | Status: DC

## 2015-02-06 NOTE — Patient Instructions (Addendum)
   We will set up an epidural injection of the low back and check MRI of the thoracic spine. We will go up on the Effexor to 150 mg a day.   Radicular Pain Radicular pain in either the arm or leg is usually from a bulging or herniated disk in the spine. A piece of the herniated disk may press against the nerves as the nerves exit the spine. This causes pain which is felt at the tips of the nerves down the arm or leg. Other causes of radicular pain may include:  Fractures.  Heart disease.  Cancer.  An abnormal and usually degenerative state of the nervous system or nerves (neuropathy). Diagnosis may require CT or MRI scanning to determine the primary cause.  Nerves that start at the neck (nerve roots) may cause radicular pain in the outer shoulder and arm. It can spread down to the thumb and fingers. The symptoms vary depending on which nerve root has been affected. In most cases radicular pain improves with conservative treatment. Neck problems may require physical therapy, a neck collar, or cervical traction. Treatment may take many weeks, and surgery may be considered if the symptoms do not improve.  Conservative treatment is also recommended for sciatica. Sciatica causes pain to radiate from the lower back or buttock area down the leg into the foot. Often there is a history of back problems. Most patients with sciatica are better after 2 to 4 weeks of rest and other supportive care. Short term bed rest can reduce the disk pressure considerably. Sitting, however, is not a good position since this increases the pressure on the disk. You should avoid bending, lifting, and all other activities which make the problem worse. Traction can be used in severe cases. Surgery is usually reserved for patients who do not improve within the first months of treatment. Only take over-the-counter or prescription medicines for pain, discomfort, or fever as directed by your caregiver. Narcotics and muscle relaxants  may help by relieving more severe pain and spasm and by providing mild sedation. Cold or massage can give significant relief. Spinal manipulation is not recommended. It can increase the degree of disc protrusion. Epidural steroid injections are often effective treatment for radicular pain. These injections deliver medicine to the spinal nerve in the space between the protective covering of the spinal cord and back bones (vertebrae). Your caregiver can give you more information about steroid injections. These injections are most effective when given within two weeks of the onset of pain.  You should see your caregiver for follow up care as recommended. A program for neck and back injury rehabilitation with stretching and strengthening exercises is an important part of management.  SEEK IMMEDIATE MEDICAL CARE IF:  You develop increased pain, weakness, or numbness in your arm or leg.  You develop difficulty with bladder or bowel control.  You develop abdominal pain.   This information is not intended to replace advice given to you by your health care provider. Make sure you discuss any questions you have with your health care provider.   Document Released: 02/21/2004 Document Revised: 02/03/2014 Document Reviewed: 08/09/2014 Elsevier Interactive Patient Education [employer].

## 2015-02-06 NOTE — Patient Outreach (Signed)
[facility]) Care Management  02/06/2015  Celesta R Grindstaff 07-26-1965 [id number]  Outreach call #2 to patient for telephonic monthly assessment.  Patient not reached.  RN CM left HIPAA compliant voice message with name and contact # for call back.  RN CM will schedule for next outreach call within one week.   Mariann Laster, RN, BSN, [facility], CCM  [employer] Management Coordinator [phone] Direct [phone] Cell [phone] Office [phone] Fax
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
## 2015-02-08 ENCOUNTER — Ambulatory Visit: Payer: Self-pay

## 2015-02-09 ENCOUNTER — Other Ambulatory Visit: Payer: Self-pay | Admitting: Licensed Clinical Social Worker

## 2015-02-09 ENCOUNTER — Other Ambulatory Visit: Payer: Self-pay

## 2015-02-09 NOTE — Patient Outreach (Signed)
[facility]) Care Management  02/09/2015  Ashley [name] 1965/11/21 [id number]  Telephonic Monthly Assessment   Outreach call #3 to patient for telephonic monthly assessment.  Patient not reached.  RN CM left HIPAA compliant voice message with name and contact # for call back. H/o [facility] SW remains active with case.   RN CM sent unsuccessful outreach letter and will review case for response in 2 weeks.   Mariann Laster, RN, BSN, [facility], CCM  [employer] Management Coordinator [phone] Direct [phone] Cell [phone] Office [phone] Fax

## 2015-02-09 NOTE — Patient Outreach (Signed)
Assessment:  CSW called home phone number of client on 02/09/15 and spoke via phone with client on 02/09/15. CSW verified client identity. Client said she has her prescribed medications and is taking medications as prescribed. She said she has adequate food supply.Client is scheduled for an MRI on 02/15/15. She also has an appointment with a surgeon on 02/19/15.  She said she has seen a neurosurgeon and has talked with neurosurgeon about symptoms she is facing.  She said she is having urinary incontinence and having some bowel incontinence. She said she had a fall in October of 2016.  She said she is not sleeping very well. She has talked with her doctor about sleep issues for client. She said she has headaches almost every day.   CSW and client spoke of her care plan.  CSW encouraged client to keep using relaxation techniques to help client manage anxiety and stress issues. She said she would try to keep using relaxation techniques to help her manage anxiety and stress issues. She is still providing some care support for her spouse.  Her spouse lives in another residence one mile away from client residence. She said she is communicating with Mariann Laster, [facility] telephonic RN, to discuss client nursing issues.  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 Client said she receives a Food Stamps benefit (small amount) each month. She also said that her spouse receives a Physicist, medical benefit each month. .She does enjoy cooking as one form of relaxation. CSW encouraged client to call CSW at [phone] to further discuss social work needs of client. CSW thanked client for phone call with CSW on 02/09/15.   Plan:  Client to continue to use relaxation techniques to help her manage anxiety and stress symptoms. CSW to call client in three weeks to assess client needs at that time.  [name]. MSW, LCSW Licensed Clinical Social  Worker [facility] Care Management [phone]

## 2015-02-15 ENCOUNTER — Ambulatory Visit
Admission: RE | Admit: 2015-02-15 | Discharge: 2015-02-15 | Disposition: A | Discharge status: Home / Self Care | Payer: Medicare Other | Source: Ambulatory Visit | Attending: Neurology | Admitting: Neurology

## 2015-02-15 DIAGNOSIS — M5124 Other intervertebral disc displacement, thoracic region: Secondary | ICD-10-CM | POA: Diagnosis not present

## 2015-02-15 DIAGNOSIS — R159 Full incontinence of feces: Secondary | ICD-10-CM | POA: Diagnosis not present

## 2015-02-16 ENCOUNTER — Telehealth: Payer: Self-pay | Admitting: Neurology

## 2015-02-16 NOTE — Telephone Encounter (Signed)
I called patient. MRI of the thoracic spine is relatively unremarkable, no evidence of spinal cord  Compression that would explain the urinary and fecal incontinence following the fall. The patient will be seeing Dr. Christella Noa next week. If the symptoms continued to worsen, we can repeat MRI of the cervical spine. The last study was done 1 year ago.    MRI thoracic spine 02/16/15:  IMPRESSION: This MRI of the thoracic spine shows the following: 1. The spinal cord appears normal. There is no central canal stenosis. 2. Small disc protrusions are noted at T2-T3, T6-T7 and T7-T8 that do not lead to any foraminal narrowing or nerve root compression.

## 2015-02-20 DIAGNOSIS — I10 Essential (primary) hypertension: Secondary | ICD-10-CM | POA: Diagnosis not present

## 2015-02-20 DIAGNOSIS — M5126 Other intervertebral disc displacement, lumbar region: Secondary | ICD-10-CM | POA: Diagnosis not present

## 2015-02-20 DIAGNOSIS — Z6831 Body mass index (BMI) 31.0-31.9, adult: Secondary | ICD-10-CM | POA: Diagnosis not present

## 2015-02-21 ENCOUNTER — Other Ambulatory Visit: Payer: Self-pay | Admitting: Neurosurgery

## 2015-02-21 ENCOUNTER — Other Ambulatory Visit (HOSPITAL_COMMUNITY): Payer: Self-pay | Admitting: Neurosurgery

## 2015-02-21 DIAGNOSIS — M5126 Other intervertebral disc displacement, lumbar region: Secondary | ICD-10-CM

## 2015-03-01 ENCOUNTER — Ambulatory Visit: Payer: Self-pay

## 2015-03-02 ENCOUNTER — Other Ambulatory Visit: Payer: Self-pay | Admitting: Licensed Clinical Social Worker

## 2015-03-02 ENCOUNTER — Other Ambulatory Visit (HOSPITAL_COMMUNITY): Payer: Self-pay | Admitting: Family Medicine

## 2015-03-02 DIAGNOSIS — Z1231 Encounter for screening mammogram for malignant neoplasm of breast: Secondary | ICD-10-CM

## 2015-03-02 NOTE — Patient Outreach (Signed)
Assessment:  CSW called client home phone number on 03/02/15 and spoke via phone with client. CSW verified client identity. CSW and Ashley [name] spoke of client needs. She said she had appontment with Dr. Quintin [name] January 30, 2015.  She said she had difficulty sleeping. She said that Dr. Quintin [name] had adjusted some of her prescribed medications. She said she is starting to sleep a little better now. .  She said she has her prescribed medications and is taking medications as prescribed. She said she is scheduled for a medical test next week at [facility]. She has Medicare insurance.  She said she still has back pain in lower back. Client back pain has been ongoing for a number of months She has pain in her left leg and in her left foot.  She has had 4 surgeries on her back.  Client has prescribed medication for pain.  She and CSW spoke of client care plan.. She likes to cook her favorite foods, take a walk, or listen to music to help her relax. CSW encouraged client to continue to use relaxation techniques in next 30 days to help her manage stress and anxiety symptoms.She said she is scheduled to see Dr. Quintin [name] in 4 months or earlier as needed. She said she is eating adequately.  She said her appetite is good.  She said she has stress related to health situation with her spouse and other situations in her family.  Client said she would be glad to talk with RN Ashley [name] if Ashley [name] is available to call client.  CSW thanked client for phone call and encouraged Ashley [name] to call CSW as needed to address social work needs of client.   Plan: Client to use relaxation techniques in next 30 days to help client manage anxiety and stress symptoms. CSW to inform Ashley Hutchinson RN that client requests phone call from [employer]. CSW to call client in 4 weeks to assess client needs.  Ashley [name]. MSW, LCSW Licensed Clinical Social Worker [facility] Care Management [phone]

## 2015-03-06 ENCOUNTER — Ambulatory Visit: Payer: Self-pay

## 2015-03-07 ENCOUNTER — Ambulatory Visit (HOSPITAL_COMMUNITY): Admission: RE | Admit: 2015-03-07 | Payer: Medicare Other | Source: Ambulatory Visit

## 2015-03-07 ENCOUNTER — Ambulatory Visit (HOSPITAL_COMMUNITY)
Admission: RE | Admit: 2015-03-07 | Discharge: 2015-03-07 | Disposition: A | Discharge status: Home / Self Care | Payer: Medicare Other | Source: Ambulatory Visit | Attending: Neurosurgery | Admitting: Neurosurgery

## 2015-03-07 ENCOUNTER — Other Ambulatory Visit: Payer: Self-pay | Admitting: Neurosurgery

## 2015-03-07 ENCOUNTER — Ambulatory Visit (HOSPITAL_COMMUNITY): Payer: Medicare Other

## 2015-03-07 DIAGNOSIS — M5126 Other intervertebral disc displacement, lumbar region: Secondary | ICD-10-CM | POA: Diagnosis not present

## 2015-03-07 DIAGNOSIS — Z5309 Procedure and treatment not carried out because of other contraindication: Secondary | ICD-10-CM | POA: Diagnosis not present

## 2015-03-07 MED ORDER — DIAZEPAM 5 MG PO TABS
10.0000 mg | ORAL_TABLET | Freq: Once | ORAL | Status: AC
  Administered 2015-03-07: 10 mg via ORAL
  Filled 2015-03-07: qty 2

## 2015-03-07 MED ORDER — DIAZEPAM 5 MG PO TABS
ORAL_TABLET | ORAL | Status: AC
  Administered 2015-03-07: 10 mg via ORAL
  Filled 2015-03-07: qty 2

## 2015-03-07 MED ORDER — ONDANSETRON HCL 4 MG/2ML IJ SOLN: 4.0000 mg | Freq: Four times a day (QID) | INTRAMUSCULAR | Status: DC | PRN

## 2015-03-09 ENCOUNTER — Ambulatory Visit: Payer: Self-pay

## 2015-03-12 ENCOUNTER — Ambulatory Visit (HOSPITAL_COMMUNITY): Payer: Medicare Other

## 2015-03-12 ENCOUNTER — Other Ambulatory Visit (HOSPITAL_COMMUNITY): Payer: Self-pay | Admitting: Family Medicine

## 2015-03-12 DIAGNOSIS — Z1231 Encounter for screening mammogram for malignant neoplasm of breast: Secondary | ICD-10-CM

## 2015-03-14 ENCOUNTER — Ambulatory Visit (HOSPITAL_COMMUNITY): Payer: Medicare Other

## 2015-03-14 ENCOUNTER — Ambulatory Visit (HOSPITAL_COMMUNITY)
Admission: RE | Admit: 2015-03-14 | Discharge: 2015-03-14 | Disposition: A | Discharge status: Home / Self Care | Payer: Medicare Other | Source: Ambulatory Visit | Attending: Neurosurgery | Admitting: Neurosurgery

## 2015-03-14 ENCOUNTER — Ambulatory Visit: Payer: Self-pay

## 2015-03-14 VITALS — BP 120/82 | HR 76 | Temp 98.0°F | Resp 18 | Ht 64.0 in | Wt 181.0 lb

## 2015-03-14 DIAGNOSIS — Z9181 History of falling: Secondary | ICD-10-CM | POA: Insufficient documentation

## 2015-03-14 DIAGNOSIS — I700 Atherosclerosis of aorta: Secondary | ICD-10-CM | POA: Diagnosis not present

## 2015-03-14 DIAGNOSIS — M5126 Other intervertebral disc displacement, lumbar region: Secondary | ICD-10-CM

## 2015-03-14 DIAGNOSIS — Z981 Arthrodesis status: Secondary | ICD-10-CM | POA: Diagnosis not present

## 2015-03-14 DIAGNOSIS — M5136 Other intervertebral disc degeneration, lumbar region: Secondary | ICD-10-CM | POA: Diagnosis not present

## 2015-03-14 MED ORDER — OXYCODONE HCL 5 MG PO TABS
ORAL_TABLET | ORAL | Status: AC
  Filled 2015-03-14: qty 1

## 2015-03-14 MED ORDER — OXYCODONE HCL 5 MG PO TABS
5.0000 mg | ORAL_TABLET | ORAL | Status: DC | PRN
  Administered 2015-03-14 (×2): 5 mg via ORAL

## 2015-03-14 MED ORDER — DIAZEPAM 5 MG PO TABS
10.0000 mg | ORAL_TABLET | Freq: Once | ORAL | Status: AC
  Administered 2015-03-14: 10 mg via ORAL

## 2015-03-14 MED ORDER — IOHEXOL 180 MG/ML  SOLN
20.0000 mL | Freq: Once | INTRAMUSCULAR | Status: AC | PRN
  Administered 2015-03-14: 16 mL via INTRATHECAL

## 2015-03-14 MED ORDER — LIDOCAINE HCL (PF) 1 % IJ SOLN
INTRAMUSCULAR | Status: AC
  Administered 2015-03-14: 5 mL via INTRAMUSCULAR
  Filled 2015-03-14: qty 10

## 2015-03-14 MED ORDER — DIAZEPAM 5 MG PO TABS
ORAL_TABLET | ORAL | Status: AC
  Administered 2015-03-14: 10 mg via ORAL
  Filled 2015-03-14: qty 2

## 2015-03-14 MED ORDER — ONDANSETRON HCL 4 MG/2ML IJ SOLN: 4.0000 mg | Freq: Four times a day (QID) | INTRAMUSCULAR | Status: DC | PRN

## 2015-03-14 NOTE — Op Note (Signed)
03/14/2015 lumbar Myelogram  PATIENT:  Ashley [name] is a 50 y.o. female with continued pain in the lumbar region and lower extremities.  PRE-OPERATIVE DIAGNOSIS:  Lumbar hnp  POST-OPERATIVE DIAGNOSIS:  Lumbar hnp  PROCEDURE:  Lumbar Myelogram  SURGEON:    ANESTHESIA:   local LOCAL MEDICATIONS USED:  LIDOCAINE  and Amount: 8 ml Procedure Note: [name] is a 50 y.o. female Was taken to the fluoroscopy suite and  positioned prone on the fluoroscopy table. Her back was prepared and draped in a sterile manner. I infiltrated 8 cc into the lumbar region. I then introduced a spinal needle into the thecal sac at the L3/4 interlaminar space. I infiltrated 20cc of Omnipaque 180 into the thecal sac. Fluoroscopy showed the needle and contrast in the thecal sac. Helmi R Gellert tolerated the procedure well. she Will be taken to CT for evaluation.     PATIENT DISPOSITION:  Short Stay

## 2015-03-14 NOTE — Discharge Instructions (Signed)
Myelography, Care After °These instructions give you information on caring for yourself after your procedure. Your doctor may also give you more specific instructions. Call your doctor if you have any problems or questions after your procedure. °HOME CARE °· Rest the first day. °· When you rest, lie flat, with your head slightly raised (elevated). °· Avoid heavy lifting and activity for 48 hours, or as told by your doctor. °· You may take the bandage (dressing) off one day after the test, or as told by your doctor. °· Take all medicines only as told by your doctor. °· Ask your doctor when it is okay to take a shower or bath. °· Ask your doctor when your test results will be ready and how you can get them. Make sure you follow up and get your results. °· Do not drink alcohol for 24 hours, or as told by your doctor. °· Drink enough fluid to keep your pee (urine) clear or pale yellow. °GET HELP IF:  °· You have a fever. °· You have a headache. °· You feel sick to your stomach (nauseous) or throw up (vomit). °· You have pain or cramping in your belly (abdomen). °GET HELP RIGHT AWAY IF:  °· You have a headache with a stiff neck or fever. °· You have trouble breathing. °· Any of the places where the needles were put in are: °¨ Puffy (swollen) or red. °¨ Sore or hot to the touch. °¨ Draining yellowish-white fluid (pus). °¨ Bleeding. °MAKE SURE YOU: °· Understand these instructions. °· Will watch your condition. °· Will get help right away if you are not doing well or get worse. °  °This information is not intended to replace advice given to you by your health care provider. Make sure you discuss any questions you have with your health care provider. °  °Document Released: 10/23/2007 Document Revised: 02/03/2014 Document Reviewed: 10/08/2011 °Elsevier Interactive Patient Education ©2016 Elsevier Inc. ° °

## 2015-03-15 ENCOUNTER — Ambulatory Visit: Payer: Self-pay

## 2015-03-19 ENCOUNTER — Other Ambulatory Visit: Payer: Self-pay

## 2015-03-19 NOTE — Patient Outreach (Signed)
[facility]) Care Management  03/19/2015  Ashley [name] 08/21/1965 [id number]   Telephonic Monthly Assessment  Additional outreach call #4 to patient for telephonic monthly assessment following unsuccessful outreach letter to patient and update from SW that patient requested call back from RN CM.  Patient not reached at [phone] Home/cell #  RN CM left HIPAA compliant voice message with name and contact # for call back. H/o [facility] SW remains active with case.  RN CM closed cased due to unable to reach.  RN CM notified [facility] SW of case closure for this RN [place], RN, BSN, [facility] [place] Management Care Management Coordinator [phone] Direct [phone] Cell [phone] Office [phone] Fax

## 2015-03-21 DIAGNOSIS — N644 Mastodynia: Secondary | ICD-10-CM | POA: Diagnosis not present

## 2015-03-21 DIAGNOSIS — R928 Other abnormal and inconclusive findings on diagnostic imaging of breast: Secondary | ICD-10-CM | POA: Diagnosis not present

## 2015-03-30 ENCOUNTER — Other Ambulatory Visit: Payer: Self-pay | Admitting: Licensed Clinical Social Worker

## 2015-03-30 NOTE — Patient Outreach (Signed)
Assessment: CSW called client home phone number on 03/30/15 and spoke via phone with client. CSW verified client identity. CSW and Ashley [name] spoke of client needs. Ashley [name] said she has her prescribed medications and is taking medications as prescribed.  She said she is eating adequately. She is having biopsy on her left breast on 04/04/15.  She is having some pain issues in her back. She has talked with her doctor about back pain issues.  She goes to doctor next week to review test results from mylegram completed on 03/14/15. She said she is walking adequately.  She is sleeping well. She had fall on October 28, 2014.  She has talked with neurologoist about her neurological needs.  She had appointment with Dr.Sasser in January of 2017.  She is providing care support for her spouse.   CSW and client spoke of client care plan.  CSW encouraged client to attend all scheduled client medical appointments in next 30 days. Client requested call from Ashley [name] to speak of chest pain issues of client.  CSW reminded client of calling 911 as needed for pain issues for client' Client said she was aware of her option to call 911 as needed.  Client has also been using relaxation techniques to help her manage anxiety and stress issues faced.  CSW informed [name] that [place] would call Crystal Hutchinson today and ask RN to call Ashley [name] today  to speak of nursing needs of client. Ashley [name] agreed to this plan.   Plan: CSW to call Crystal Hutchinson on 03/30/15 and inform Crystal of above information and request that [place] call client on 03/30/15 at [phone] to discuss client needs. Client to attend scheduled client medical appointments in next 30 days. CSW to call client in two weeks to assess client needs.  Ashley [name]. MSW, LCSW Licensed Clinical Social Worker [facility] Care Management [phone]

## 2015-04-02 ENCOUNTER — Other Ambulatory Visit: Payer: Self-pay | Admitting: Neurosurgery

## 2015-04-02 DIAGNOSIS — M4726 Other spondylosis with radiculopathy, lumbar region: Secondary | ICD-10-CM | POA: Diagnosis not present

## 2015-04-04 DIAGNOSIS — D242 Benign neoplasm of left breast: Secondary | ICD-10-CM | POA: Diagnosis not present

## 2015-04-04 DIAGNOSIS — N6032 Fibrosclerosis of left breast: Secondary | ICD-10-CM | POA: Diagnosis not present

## 2015-04-04 DIAGNOSIS — N63 Unspecified lump in breast: Secondary | ICD-10-CM | POA: Diagnosis not present

## 2015-04-05 ENCOUNTER — Ambulatory Visit: Payer: Self-pay

## 2015-04-06 ENCOUNTER — Ambulatory Visit: Payer: Self-pay

## 2015-04-09 ENCOUNTER — Other Ambulatory Visit: Payer: Self-pay
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
## 2015-04-09 NOTE — Pre-Procedure Instructions (Signed)
Ashley [name]  04/09/2015      [facility][id number] Ashley [name], [place] Ashley [name] [address] Phone: [phone] Fax: Ashley [name], Ashley [name] [place] [address] Ashley [name] Ashley [name] [address] Phone: [phone] Fax: [phone]    Your procedure is scheduled on   Thursday  04/12/15  Report to [facility] Admitting at 1:00 P.M.  Call this number if you have problems the morning of surgery:  [phone]   Remember:  Do not eat food or drink liquids after midnight on Wed. March 15   Take these medicines the morning of surgery with A SIP OF WATER: xanax if needed,fioricet if needed, gabapentin, phenergan if needed, propranolol, tramadol if needed, effexor    (STOP ASPIRIN OR ASPIRIN PRODUCTS, COUMADIN, PLAVIX, EFFIENT, HERBAL MEDICINES)   Do not wear jewelry, make-up or nail polish.  Do not wear lotions, powders, or perfumes.  You may not wear deodorant.  Do not shave 48 hours prior to surgery.  Men may shave face and neck.  Do not bring valuables to the hospital.  [facility] is not responsible for any belongings or valuables.  Contacts, dentures or bridgework may not be worn into surgery.  Leave your suitcase in the car.  After surgery it may be brought to your room.  For patients admitted to the hospital, discharge time will be determined by your treatment team.  Patients discharged the day of surgery will not be allowed to drive home.   Name and phone number of your driver:    Special instructions:  [place] - Preparing for Surgery  Before surgery, you can play an important role.  Because skin is not sterile, your skin needs to be as free of germs as possible.  You can reduce the number of germs on you skin by washing with CHG (chlorahexidine gluconate) soap before surgery.  CHG is an antiseptic cleaner which kills germs and bonds with the skin to continue killing germs even after washing.  Please DO NOT use if  you have an allergy to CHG or antibacterial soaps.  If your skin becomes reddened/irritated stop using the CHG and inform your nurse when you arrive at Short Stay.  Do not shave (including legs and underarms) for at least 48 hours prior to the first CHG shower.  You may shave your face.  Please follow these instructions carefully:   1.  Shower with CHG Soap the night before surgery and the                                morning of Surgery.  2.  If you choose to wash your hair, wash your hair first as usual with your       normal shampoo.  3.  After you shampoo, rinse your hair and body thoroughly to remove the                      Shampoo.  4.  Use CHG as you would any other liquid soap.  You can apply chg directly       to the skin and wash gently with scrungie or a clean washcloth.  5.  Apply the CHG Soap to your body ONLY FROM THE NECK DOWN.        Do not use on open wounds or open sores.  Avoid contact with your eyes,       ears, mouth and genitals (private parts).  Wash genitals (private parts)       with your normal soap.  6.  Wash thoroughly, paying special attention to the area where your surgery        will be performed.  7.  Thoroughly rinse your body with warm water from the neck down.  8.  DO NOT shower/wash with your normal soap after using and rinsing off       the CHG Soap.  9.  Pat yourself dry with a clean towel.            10.  Wear clean pajamas.            11.  Place clean sheets on your bed the night of your first shower and do not        sleep with pets.  Day of Surgery  Do not apply any lotions/deoderants the morning of surgery.  Please wear clean clothes to the hospital/surgery center.    Please read over the following fact sheets that you were given. Pain Booklet, Coughing and Deep Breathing, MRSA Information and Surgical Site Infection Prevention

## 2015-04-10 ENCOUNTER — Encounter (HOSPITAL_COMMUNITY): Payer: Self-pay

## 2015-04-10 ENCOUNTER — Encounter (HOSPITAL_COMMUNITY)
Admission: RE | Admit: 2015-04-10 | Discharge: 2015-04-10 | Disposition: A | Discharge status: Home / Self Care | Payer: Medicare Other | Source: Ambulatory Visit | Attending: Neurosurgery | Admitting: Neurosurgery

## 2015-04-10 DIAGNOSIS — M4727 Other spondylosis with radiculopathy, lumbosacral region: Secondary | ICD-10-CM | POA: Diagnosis not present

## 2015-04-10 DIAGNOSIS — K219 Gastro-esophageal reflux disease without esophagitis: Secondary | ICD-10-CM | POA: Diagnosis not present

## 2015-04-10 DIAGNOSIS — I499 Cardiac arrhythmia, unspecified: Secondary | ICD-10-CM | POA: Diagnosis not present

## 2015-04-10 DIAGNOSIS — M549 Dorsalgia, unspecified: Secondary | ICD-10-CM | POA: Diagnosis present

## 2015-04-10 DIAGNOSIS — I10 Essential (primary) hypertension: Secondary | ICD-10-CM | POA: Diagnosis not present

## 2015-04-10 LAB — SURGICAL PCR SCREEN
MRSA, PCR: NEGATIVE
Staphylococcus aureus: NEGATIVE

## 2015-04-10 LAB — COMPREHENSIVE METABOLIC PANEL
ALBUMIN: 3.7 g/dL (ref 3.5–5.0)
ALK PHOS: 57 U/L (ref 38–126)
ALT: 19 U/L (ref 14–54)
AST: 18 U/L (ref 15–41)
Anion gap: 7 (ref 5–15)
BILIRUBIN TOTAL: 0.5 mg/dL (ref 0.3–1.2)
BUN: 14 mg/dL (ref 6–20)
CO2: 22 mmol/L (ref 22–32)
CREATININE: 0.66 mg/dL (ref 0.44–1.00)
Calcium: 9.1 mg/dL (ref 8.9–10.3)
Chloride: 114 mmol/L — ABNORMAL HIGH (ref 101–111)
GFR calc Af Amer: >60 mL/min (ref >60)
GFR calc non Af Amer: >60 mL/min (ref >60)
GLUCOSE: 105 mg/dL — AB (ref 65–99)
POTASSIUM: 3.9 mmol/L (ref 3.5–5.1)
Sodium: 143 mmol/L (ref 135–145)
TOTAL PROTEIN: 6.2 g/dL — AB (ref 6.5–8.1)

## 2015-04-10 LAB — CBC
HEMATOCRIT: 41.4 % (ref 36.0–46.0)
HEMOGLOBIN: 13.3 g/dL (ref 12.0–15.0)
MCH: 28.7 pg (ref 26.0–34.0)
MCHC: 32.1 g/dL (ref 30.0–36.0)
MCV: 89.4 fL (ref 78.0–100.0)
Platelets: 277 10*3/uL (ref 150–400)
RBC: 4.63 MIL/uL (ref 3.87–5.11)
RDW: 13.1 % (ref 11.5–15.5)
WBC: 8.6 10*3/uL (ref 4.0–10.5)

## 2015-04-10 NOTE — Progress Notes (Signed)
PCP:Dr. Consuello Masse @[facility] in [place], will request ekg Neurologist: Dr.Keith Jannifer Franklin No cardiologist  Will request [facility]: heart cath, stress test, echo,pt.states done 2013- stated test was negative

## 2015-04-11 MED ORDER — CEFAZOLIN SODIUM-DEXTROSE 2-3 GM-% IV SOLR
2.0000 g | INTRAVENOUS | Status: AC
  Administered 2015-04-12: 2 g via INTRAVENOUS
  Filled 2015-04-11: qty 50

## 2015-04-12 ENCOUNTER — Ambulatory Visit (HOSPITAL_COMMUNITY)
Admission: RE | Admit: 2015-04-12 | Discharge: 2015-04-13 | Disposition: A | Discharge status: Home / Self Care | Payer: Medicare Other | Source: Ambulatory Visit | Attending: Neurosurgery | Admitting: Neurosurgery

## 2015-04-12 ENCOUNTER — Encounter (HOSPITAL_COMMUNITY)
Admission: RE | Disposition: A | Discharge status: Home / Self Care | Payer: Self-pay | Source: Ambulatory Visit | Attending: Neurosurgery

## 2015-04-12 ENCOUNTER — Ambulatory Visit (HOSPITAL_COMMUNITY): Payer: Medicare Other

## 2015-04-12 ENCOUNTER — Encounter (HOSPITAL_COMMUNITY): Payer: Self-pay | Admitting: *Deleted

## 2015-04-12 ENCOUNTER — Ambulatory Visit (HOSPITAL_COMMUNITY): Payer: Medicare Other | Admitting: Certified Registered Nurse Anesthetist

## 2015-04-12 DIAGNOSIS — K219 Gastro-esophageal reflux disease without esophagitis: Secondary | ICD-10-CM | POA: Insufficient documentation

## 2015-04-12 DIAGNOSIS — M4726 Other spondylosis with radiculopathy, lumbar region: Secondary | ICD-10-CM | POA: Diagnosis present

## 2015-04-12 DIAGNOSIS — I10 Essential (primary) hypertension: Secondary | ICD-10-CM | POA: Insufficient documentation

## 2015-04-12 DIAGNOSIS — M1990 Unspecified osteoarthritis, unspecified site: Secondary | ICD-10-CM | POA: Diagnosis not present

## 2015-04-12 DIAGNOSIS — M797 Fibromyalgia: Secondary | ICD-10-CM | POA: Diagnosis not present

## 2015-04-12 DIAGNOSIS — Z9889 Other specified postprocedural states: Secondary | ICD-10-CM | POA: Diagnosis not present

## 2015-04-12 DIAGNOSIS — M4727 Other spondylosis with radiculopathy, lumbosacral region: Secondary | ICD-10-CM | POA: Diagnosis not present

## 2015-04-12 DIAGNOSIS — Z419 Encounter for procedure for purposes other than remedying health state, unspecified: Secondary | ICD-10-CM

## 2015-04-12 DIAGNOSIS — I499 Cardiac arrhythmia, unspecified: Secondary | ICD-10-CM | POA: Diagnosis not present

## 2015-04-12 DIAGNOSIS — M549 Dorsalgia, unspecified: Secondary | ICD-10-CM | POA: Diagnosis not present

## 2015-04-12 HISTORY — PX: LUMBAR LAMINECTOMY/DECOMPRESSION MICRODISCECTOMY: SHX5026

## 2015-04-12 SURGERY — LUMBAR LAMINECTOMY/DECOMPRESSION MICRODISCECTOMY 1 LEVEL
Anesthesia: General | Site: Spine Lumbar | Laterality: Left

## 2015-04-12 MED ORDER — SODIUM CHLORIDE 0.9 % IV SOLN: 250.0000 mL | INTRAVENOUS | Status: DC

## 2015-04-12 MED ORDER — ALBUTEROL SULFATE HFA 108 (90 BASE) MCG/ACT IN AERS
INHALATION_SPRAY | RESPIRATORY_TRACT | Status: AC
  Filled 2015-04-12: qty 6.7

## 2015-04-12 MED ORDER — ONDANSETRON HCL 4 MG/2ML IJ SOLN: 4.0000 mg | INTRAMUSCULAR | Status: DC | PRN

## 2015-04-12 MED ORDER — HYDROMORPHONE HCL 1 MG/ML IJ SOLN
0.2500 mg | INTRAMUSCULAR | Status: DC | PRN
  Administered 2015-04-12 (×2): 0.5 mg via INTRAVENOUS

## 2015-04-12 MED ORDER — METHYLPREDNISOLONE ACETATE 80 MG/ML IJ SUSP
INTRAMUSCULAR | Status: DC | PRN
  Administered 2015-04-12: 80 mg

## 2015-04-12 MED ORDER — SODIUM CHLORIDE 0.9% FLUSH: 3.0000 mL | Freq: Two times a day (BID) | INTRAVENOUS | Status: DC

## 2015-04-12 MED ORDER — ROCURONIUM BROMIDE 50 MG/5ML IV SOLN
INTRAVENOUS | Status: AC
  Filled 2015-04-12: qty 1

## 2015-04-12 MED ORDER — HYDROMORPHONE HCL 1 MG/ML IJ SOLN
INTRAMUSCULAR | Status: AC
  Administered 2015-04-12: 0.5 mg via INTRAVENOUS
  Filled 2015-04-12: qty 1

## 2015-04-12 MED ORDER — TRAMADOL HCL 50 MG PO TABS: 50.0000 mg | ORAL_TABLET | Freq: Three times a day (TID) | ORAL | Status: DC | PRN

## 2015-04-12 MED ORDER — FENTANYL CITRATE (PF) 250 MCG/5ML IJ SOLN
INTRAMUSCULAR | Status: AC
  Filled 2015-04-12: qty 5

## 2015-04-12 MED ORDER — DIPHENHYDRAMINE HCL 50 MG/ML IJ SOLN
INTRAMUSCULAR | Status: DC | PRN
  Administered 2015-04-12: 25 mg via INTRAVENOUS

## 2015-04-12 MED ORDER — TOPIRAMATE 25 MG PO TABS
150.0000 mg | ORAL_TABLET | Freq: Every day | ORAL | Status: DC
  Administered 2015-04-12: 150 mg via ORAL
  Filled 2015-04-12 (×3): qty 2

## 2015-04-12 MED ORDER — FENTANYL CITRATE (PF) 100 MCG/2ML IJ SOLN
INTRAMUSCULAR | Status: DC | PRN
  Administered 2015-04-12: 100 ug via INTRAVENOUS

## 2015-04-12 MED ORDER — 0.9 % SODIUM CHLORIDE (POUR BTL) OPTIME
TOPICAL | Status: DC | PRN
  Administered 2015-04-12: 1000 mL

## 2015-04-12 MED ORDER — PROMETHAZINE HCL 25 MG/ML IJ SOLN
INTRAMUSCULAR | Status: AC
  Administered 2015-04-12: 6.25 mg via INTRAVENOUS
  Filled 2015-04-12: qty 1

## 2015-04-12 MED ORDER — KETOROLAC TROMETHAMINE 30 MG/ML IJ SOLN
30.0000 mg | Freq: Four times a day (QID) | INTRAMUSCULAR | Status: DC
Start: 2015-04-12 — End: 2015-04-14
  Administered 2015-04-12 – 2015-04-13 (×5): 30 mg via INTRAVENOUS
  Filled 2015-04-12 (×5): qty 1

## 2015-04-12 MED ORDER — FENTANYL CITRATE (PF) 100 MCG/2ML IJ SOLN
INTRAMUSCULAR | Status: AC
  Filled 2015-04-12: qty 2

## 2015-04-12 MED ORDER — ACETAMINOPHEN 325 MG PO TABS: 650.0000 mg | ORAL_TABLET | ORAL | Status: DC | PRN

## 2015-04-12 MED ORDER — GABAPENTIN 400 MG PO CAPS
800.0000 mg | ORAL_CAPSULE | Freq: Four times a day (QID) | ORAL | Status: DC
  Administered 2015-04-12 – 2015-04-13 (×4): 800 mg via ORAL
  Filled 2015-04-12 (×4): qty 2

## 2015-04-12 MED ORDER — SUGAMMADEX SODIUM 500 MG/5ML IV SOLN
INTRAVENOUS | Status: AC
  Filled 2015-04-12: qty 5

## 2015-04-12 MED ORDER — PROPOFOL 10 MG/ML IV BOLUS
INTRAVENOUS | Status: DC | PRN
  Administered 2015-04-12: 130 mg via INTRAVENOUS

## 2015-04-12 MED ORDER — HYDROMORPHONE HCL 1 MG/ML IJ SOLN
0.5000 mg | INTRAMUSCULAR | Status: DC | PRN
  Administered 2015-04-12 – 2015-04-13 (×2): 1 mg via INTRAVENOUS
  Filled 2015-04-12 (×2): qty 1

## 2015-04-12 MED ORDER — LIDOCAINE HCL (CARDIAC) 20 MG/ML IV SOLN
INTRAVENOUS | Status: DC | PRN
  Administered 2015-04-12: 60 mg via INTRAVENOUS

## 2015-04-12 MED ORDER — ONDANSETRON HCL 4 MG/2ML IJ SOLN
INTRAMUSCULAR | Status: AC
  Filled 2015-04-12: qty 2

## 2015-04-12 MED ORDER — HEMOSTATIC AGENTS (NO CHARGE) OPTIME
TOPICAL | Status: DC | PRN
  Administered 2015-04-12: 1 via TOPICAL

## 2015-04-12 MED ORDER — ZOLPIDEM TARTRATE 5 MG PO TABS: 5.0000 mg | ORAL_TABLET | Freq: Every evening | ORAL | Status: DC | PRN

## 2015-04-12 MED ORDER — MENTHOL 3 MG MT LOZG: 1.0000 | LOZENGE | OROMUCOSAL | Status: DC | PRN

## 2015-04-12 MED ORDER — VENLAFAXINE HCL ER 75 MG PO CP24: 150.0000 mg | ORAL_CAPSULE | Freq: Every day | ORAL | Status: DC

## 2015-04-12 MED ORDER — ROCURONIUM BROMIDE 100 MG/10ML IV SOLN
INTRAVENOUS | Status: DC | PRN
  Administered 2015-04-12: 50 mg via INTRAVENOUS

## 2015-04-12 MED ORDER — POTASSIUM CHLORIDE IN NACL 20-0.9 MEQ/L-% IV SOLN
INTRAVENOUS | Status: DC
  Filled 2015-04-12 (×3): qty 1000

## 2015-04-12 MED ORDER — PROMETHAZINE HCL 25 MG/ML IJ SOLN
6.2500 mg | INTRAMUSCULAR | Status: DC | PRN
  Administered 2015-04-12: 6.25 mg via INTRAVENOUS

## 2015-04-12 MED ORDER — DIAZEPAM 5 MG PO TABS
5.0000 mg | ORAL_TABLET | Freq: Four times a day (QID) | ORAL | Status: DC | PRN
  Administered 2015-04-12 – 2015-04-13 (×2): 5 mg via ORAL
  Filled 2015-04-12 (×2): qty 1

## 2015-04-12 MED ORDER — ONDANSETRON HCL 4 MG/2ML IJ SOLN
INTRAMUSCULAR | Status: DC | PRN
  Administered 2015-04-12: 4 mg via INTRAVENOUS

## 2015-04-12 MED ORDER — PROPRANOLOL HCL ER 80 MG PO CP24
80.0000 mg | ORAL_CAPSULE | Freq: Every day | ORAL | Status: DC
  Administered 2015-04-12: 80 mg via ORAL
  Filled 2015-04-12 (×5): qty 1

## 2015-04-12 MED ORDER — ACETAMINOPHEN 650 MG RE SUPP: 650.0000 mg | RECTAL | Status: DC | PRN

## 2015-04-12 MED ORDER — PHENOL 1.4 % MT LIQD: 1.0000 | OROMUCOSAL | Status: DC | PRN

## 2015-04-12 MED ORDER — PROPOFOL 10 MG/ML IV BOLUS
INTRAVENOUS | Status: AC
  Filled 2015-04-12: qty 20

## 2015-04-12 MED ORDER — MAGNESIUM CITRATE PO SOLN: 1.0000 | Freq: Once | ORAL | Status: DC | PRN

## 2015-04-12 MED ORDER — OXYCODONE-ACETAMINOPHEN 5-325 MG PO TABS
1.0000 | ORAL_TABLET | ORAL | Status: DC | PRN
  Administered 2015-04-12 – 2015-04-13 (×4): 2 via ORAL
  Filled 2015-04-12 (×4): qty 2

## 2015-04-12 MED ORDER — PROMETHAZINE HCL 25 MG PO TABS: 25.0000 mg | ORAL_TABLET | Freq: Four times a day (QID) | ORAL | Status: DC | PRN

## 2015-04-12 MED ORDER — LACTATED RINGERS IV SOLN
INTRAVENOUS | Status: DC
  Administered 2015-04-12 (×3): via INTRAVENOUS

## 2015-04-12 MED ORDER — BUTALBITAL-APAP-CAFFEINE 50-325-40 MG PO TABS: 1.0000 | ORAL_TABLET | ORAL | Status: DC | PRN

## 2015-04-12 MED ORDER — FENTANYL CITRATE (PF) 250 MCG/5ML IJ SOLN
INTRAMUSCULAR | Status: DC | PRN
  Administered 2015-04-12: 100 ug via INTRAVENOUS

## 2015-04-12 MED ORDER — MIDAZOLAM HCL 2 MG/2ML IJ SOLN
INTRAMUSCULAR | Status: AC
  Filled 2015-04-12: qty 2

## 2015-04-12 MED ORDER — SENNOSIDES-DOCUSATE SODIUM 8.6-50 MG PO TABS: 1.0000 | ORAL_TABLET | Freq: Every evening | ORAL | Status: DC | PRN

## 2015-04-12 MED ORDER — SCOPOLAMINE 1 MG/3DAYS TD PT72
MEDICATED_PATCH | TRANSDERMAL | Status: AC
  Administered 2015-04-12: 14:00:00
  Filled 2015-04-12: qty 1

## 2015-04-12 MED ORDER — SODIUM CHLORIDE 0.9% FLUSH: 3.0000 mL | INTRAVENOUS | Status: DC | PRN

## 2015-04-12 MED ORDER — THROMBIN 5000 UNITS EX SOLR
CUTANEOUS | Status: DC | PRN
  Administered 2015-04-12 (×2): 5000 [IU] via TOPICAL

## 2015-04-12 MED ORDER — EPHEDRINE SULFATE 50 MG/ML IJ SOLN
INTRAMUSCULAR | Status: DC | PRN
  Administered 2015-04-12: 10 mg via INTRAVENOUS

## 2015-04-12 MED ORDER — SUGAMMADEX SODIUM 200 MG/2ML IV SOLN
INTRAVENOUS | Status: DC | PRN
  Administered 2015-04-12: 200 mg via INTRAVENOUS

## 2015-04-12 MED ORDER — MIDAZOLAM HCL 5 MG/5ML IJ SOLN
INTRAMUSCULAR | Status: DC | PRN
  Administered 2015-04-12: 2 mg via INTRAVENOUS

## 2015-04-12 MED ORDER — ALPRAZOLAM 0.5 MG PO TABS: 0.5000 mg | ORAL_TABLET | Freq: Every evening | ORAL | Status: DC | PRN

## 2015-04-12 MED ORDER — CALCIUM CARBONATE ANTACID 500 MG PO CHEW
1.0000 | CHEWABLE_TABLET | Freq: Every evening | ORAL | Status: DC | PRN
  Filled 2015-04-12: qty 1

## 2015-04-12 MED ORDER — LORAZEPAM 0.5 MG PO TABS
0.5000 mg | ORAL_TABLET | Freq: Every day | ORAL | Status: DC
  Administered 2015-04-12: 0.5 mg via ORAL
  Filled 2015-04-12: qty 1

## 2015-04-12 MED ORDER — PANTOPRAZOLE SODIUM 40 MG PO TBEC: 40.0000 mg | DELAYED_RELEASE_TABLET | Freq: Every day | ORAL | Status: DC

## 2015-04-12 MED ORDER — LIDOCAINE-EPINEPHRINE 0.5 %-1:200000 IJ SOLN
INTRAMUSCULAR | Status: DC | PRN
  Administered 2015-04-12: 10 mL

## 2015-04-12 MED ORDER — BISACODYL 5 MG PO TBEC: 5.0000 mg | DELAYED_RELEASE_TABLET | Freq: Every day | ORAL | Status: DC | PRN

## 2015-04-12 MED ORDER — DOCUSATE SODIUM 100 MG PO CAPS
100.0000 mg | ORAL_CAPSULE | Freq: Two times a day (BID) | ORAL | Status: DC
  Administered 2015-04-12 – 2015-04-13 (×2): 100 mg via ORAL
  Filled 2015-04-12 (×2): qty 1

## 2015-04-12 SURGICAL SUPPLY — 50 items
APL SKNCLS STERI-STRIP NONHPOA (GAUZE/BANDAGES/DRESSINGS)
BAG DECANTER FOR FLEXI CONT (MISCELLANEOUS) ×2 IMPLANT
BENZOIN TINCTURE PRP APPL 2/3 (GAUZE/BANDAGES/DRESSINGS) IMPLANT
BLADE CLIPPER SURG (BLADE) IMPLANT
BUR MATCHSTICK NEURO 3.0 LAGG (BURR) ×2 IMPLANT
CANISTER SUCT 3000ML PPV (MISCELLANEOUS) ×2 IMPLANT
DECANTER SPIKE VIAL GLASS SM (MISCELLANEOUS) ×2 IMPLANT
DRAPE LAPAROTOMY 100X72X124 (DRAPES) ×2 IMPLANT
DRAPE MICROSCOPE LEICA (MISCELLANEOUS) ×2 IMPLANT
DRAPE POUCH INSTRU U-SHP 10X18 (DRAPES) ×2 IMPLANT
DRAPE SURG 17X23 STRL (DRAPES) ×2 IMPLANT
DRSG OPSITE POSTOP 4X6 (GAUZE/BANDAGES/DRESSINGS) ×2 IMPLANT
DURAPREP 26ML APPLICATOR (WOUND CARE) ×2 IMPLANT
ELECT REM PT RETURN 9FT ADLT (ELECTROSURGICAL) ×2
ELECTRODE REM PT RTRN 9FT ADLT (ELECTROSURGICAL) ×1 IMPLANT
GAUZE SPONGE 4X4 12PLY STRL (GAUZE/BANDAGES/DRESSINGS) IMPLANT
GAUZE SPONGE 4X4 16PLY XRAY LF (GAUZE/BANDAGES/DRESSINGS) IMPLANT
GLOVE ECLIPSE 6.5 STRL STRAW (GLOVE) IMPLANT
GLOVE EXAM NITRILE LRG STRL (GLOVE) IMPLANT
GLOVE EXAM NITRILE MD LF STRL (GLOVE) IMPLANT
GLOVE EXAM NITRILE XL STR (GLOVE) IMPLANT
GLOVE EXAM NITRILE XS STR PU (GLOVE) IMPLANT
GLOVE SURG SS PI 6.5 STRL IVOR (GLOVE) ×4 IMPLANT
GLOVE SURG SS PI 7.0 STRL IVOR (GLOVE) ×4 IMPLANT
GOWN STRL REUS W/ TWL LRG LVL3 (GOWN DISPOSABLE) ×2 IMPLANT
GOWN STRL REUS W/ TWL XL LVL3 (GOWN DISPOSABLE) IMPLANT
GOWN STRL REUS W/TWL 2XL LVL3 (GOWN DISPOSABLE) IMPLANT
GOWN STRL REUS W/TWL LRG LVL3 (GOWN DISPOSABLE) ×4
GOWN STRL REUS W/TWL XL LVL3 (GOWN DISPOSABLE)
KIT BASIN OR (CUSTOM PROCEDURE TRAY) ×2 IMPLANT
KIT ROOM TURNOVER OR (KITS) ×2 IMPLANT
LIQUID BAND (GAUZE/BANDAGES/DRESSINGS) ×2 IMPLANT
NEEDLE HYPO 18GX1.5 BLUNT FILL (NEEDLE) ×2 IMPLANT
NEEDLE HYPO 25X1 1.5 SAFETY (NEEDLE) ×2 IMPLANT
NEEDLE SPNL 18GX3.5 QUINCKE PK (NEEDLE) IMPLANT
NS IRRIG 1000ML POUR BTL (IV SOLUTION) ×2 IMPLANT
PACK LAMINECTOMY NEURO (CUSTOM PROCEDURE TRAY) ×2 IMPLANT
PAD ARMBOARD 7.5X6 YLW CONV (MISCELLANEOUS) ×6 IMPLANT
RUBBERBAND STERILE (MISCELLANEOUS) ×4 IMPLANT
SPONGE LAP 4X18 X RAY DECT (DISPOSABLE) IMPLANT
SPONGE SURGIFOAM ABS GEL SZ50 (HEMOSTASIS) ×2 IMPLANT
STRIP CLOSURE SKIN 1/2X4 (GAUZE/BANDAGES/DRESSINGS) IMPLANT
SUT VIC AB 0 CT1 18XCR BRD8 (SUTURE) ×1 IMPLANT
SUT VIC AB 0 CT1 8-18 (SUTURE) ×2
SUT VIC AB 2-0 CT1 18 (SUTURE) ×2 IMPLANT
SUT VIC AB 3-0 SH 8-18 (SUTURE) ×2 IMPLANT
SYR 5ML LL (SYRINGE) ×2 IMPLANT
TOWEL OR 17X24 6PK STRL BLUE (TOWEL DISPOSABLE) ×2 IMPLANT
TOWEL OR 17X26 10 PK STRL BLUE (TOWEL DISPOSABLE) ×2 IMPLANT
WATER STERILE IRR 1000ML POUR (IV SOLUTION) ×2 IMPLANT

## 2015-04-12 NOTE — H&P (Signed)
Ashley [name] was sent back to me because she took in early September when she was walking the dogs.  She had been doing well prior to that, then ever since then she has had pain in her back and in her left foot.  She initially saw a podiatrist who felt sure that her problem was stemming from the lumbar region.  She then went to see Ashley [name] who performed an EMG/nerve conduction study which showed S1 radiculopathy with some acute and chronic changes.  She then underwent an MRI of the lumbar spine, which showed some enhancement around the disc space at [id number], but there is certainly a displacement of the S1 root.  Where she had had surgery at L4-5 looks fine, and there is no compression or any element of compression there.  She mentioned that she had had some urinary incontinence, and there is absolutely no compression of the spinal cord or anything near the cauda equina or the conus that would be causing this, so that is not from a neurologic reason.     PHYSICAL EXAMINATION:                    Today on exam she has a markedly antalgic gait.  She can stand on her toes and take a few steps.  She can stand on her heels and takes a few steps.  Reflexes are intact at the knees and right ankle, 1+ at the left ankle.  She has normal muscle tone, bulk, and coordination.  Memory, language, attention span, and fund of knowledge is normal.     Vital signs, height 64 inches, weight 182 pounds, BMI of 31.24, blood pressure is 139/94, pulse 92, respiratory rate is 16, temperature is 98.8 degrees Fahrenheit.  She rates her pain at 5/10.  She says that the thigh is numb, and that the calf cramps on the right side, sometimes she has pain that runs through.     IMPRESSION/PLAN:                             I looked at the MRI, and I am not convinced that this is a recurrent disc.  EMG evidence is strongly suggestive.  Thus, I would like her to undergo a myelogram/postmyelogram CT.  She has had this before and we will  do this, and see how she does afterwards.    Ashley [name] returns after myelogram and postmyelogram CT.  It shows that while she may not have a solid fusion at L4-5, she is not having any pain as a result of the that level.  The problem, if there is one here, may be the foraminal narrowing at L5-S1 on the left side.  The facet is certainly hypertrophic and arthritic.                           IMPRESSION/PLAN:                             She would like to go ahead with a decompression of that neural foramen.  It is not a problem in the disc space at all, but that facet.  Risks and benefits she understands well.  She says she just cannot keep going the way that she is and that she is in too much pain.

## 2015-04-12 NOTE — Anesthesia Procedure Notes (Signed)
Procedure Name: Intubation Date/Time: 04/12/2015 3:33 PM Performed by: Ollen Bowl Pre-anesthesia Checklist: Patient identified, Emergency Drugs available, Suction available, Patient being monitored and Timeout performed Patient Re-evaluated:Patient Re-evaluated prior to inductionOxygen Delivery Method: Circle system utilized and Simple face mask Preoxygenation: Pre-oxygenation with 100% oxygen Intubation Type: IV induction Ventilation: Mask ventilation without difficulty Laryngoscope Size: Miller and 3 Grade View: Grade II Tube type: Oral Tube size: 7.5 mm Number of attempts: 1 Airway Equipment and Method: Patient positioned with wedge pillow and Stylet Placement Confirmation: ETT inserted through vocal cords under direct vision,  positive ETCO2 and breath sounds checked- equal and bilateral Secured at: 22 cm Tube secured with: Tape Dental Injury: Teeth and Oropharynx as per pre-operative assessment

## 2015-04-12 NOTE — Transfer of Care (Signed)
Immediate Anesthesia Transfer of Care Note  Patient: Ashley [name]  Procedure(s) Performed: Procedure(s) with comments: LEFT LUMBAR FIVE-SACRAL ONE LUMBAR LAMINECTOMY/DECOMPRESSION MICRODISCECTOMY  (Left) - Left L5S1 foraminotomy  Patient Location: PACU  Anesthesia Type:General  Level of Consciousness: awake and alert   Airway & Oxygen Therapy: Patient Spontanous Breathing and Patient connected to nasal cannula oxygen  Post-op Assessment: Report given to RN, Post -op Vital signs reviewed and stable and Patient moving all extremities X 4  Post vital signs: Reviewed and stable  Last Vitals:  Filed Vitals:   04/12/15 1311  BP: 126/76  Pulse: 64  Temp: 36.7 C  Resp: 18    Complications: No apparent anesthesia complications

## 2015-04-12 NOTE — Op Note (Signed)
04/12/2015  4:51 PM  PATIENT:  Ashley [name]  50 y.o. female with pain in the left lower extremity and poor filling of the left s1 , and L5 roots on myelography. She has opted for a surgical decompression of the nerve roots.   PRE-OPERATIVE DIAGNOSIS:  lumbar osteoarthritis with radiculopathyL5/S1  POST-OPERATIVE DIAGNOSIS:  lumbar osteoarthritis with radiculopathy L5/S1  PROCEDURE:  Procedure(s): LEFT LUMBAR FIVE-SACRAL ONE LUMBAR decompressive laminectomy, to decompress the L5 and S1 roots.  SURGEON: Surgeon(s): Ashok Pall, MD Leeroy Cha, MD  ASSISTANTS:Botero, Stann Mainland  ANESTHESIA:   general  EBL:  Total I/O In: 1000 [I.V.:1000] Out: 25 [Blood:25]  BLOOD ADMINISTERED:none  CELL SAVER GIVEN:none  COUNT:per nursing  DRAINS: none   SPECIMEN:  No Specimen  DICTATION: Ashley [name] was taken to the operating room, intubated, and placed under a general anesthetic without difficulty.  She was positioned prone on a wilson frame with all pressure points padded. Her lumbar region was prepped and draped in a sterile fashion. I placed a needle into the skin at the inferior portion of her previous fusion incision as a guide. I then removed the needle and started the procedure. I opened the skin with a 10 blade and dissected sharply to the thoracolumbar fascia. I used the monopolar cautery to expose the L5 pedicle screw on the left, the left L5 lamina, and the S1 lamina. I used the drill to perform a semihemilaminectomy of L5, and S1. I removed the ligamentum flavum to expose the thecal sac and started the direct decompression of the L5 and S1 roots. I performed a partial inferior facetectomy of the left L5 inferior facet. I removed enough bone with Dr. Harley Hallmark assistance so that the L5 and S1 roots were decompressed. I also removed more lateral ligament to aid in the decompression of the roots. With both the thecal sac and nerve roots without apparent compression we irrigated. I  bathed the resection site with depomedrol and fentanyl then closed.  We approximated the thoracolumbar fascia, subcutaneous, and subcuticular layers with vicryl sutures. I used Dermabond for a sterile dressing,along with an occlusive bandage.   PLAN OF CARE: Admit for overnight observation  PATIENT DISPOSITION:  PACU - hemodynamically stable.   Delay start of Pharmacological VTE agent (>24hrs) due to surgical blood loss or risk of bleeding:  yes

## 2015-04-12 NOTE — Anesthesia Postprocedure Evaluation (Signed)
Anesthesia Post Note  Patient: Ayla R Klomp  Procedure(s) Performed: Procedure(s) (LRB): LEFT LUMBAR FIVE-SACRAL ONE LUMBAR LAMINECTOMY/DECOMPRESSION MICRODISCECTOMY  (Left)  Patient location during evaluation: PACU Anesthesia Type: General Level of consciousness: awake and alert Pain management: pain level controlled Vital Signs Assessment: post-procedure vital signs reviewed and stable Respiratory status: spontaneous breathing, nonlabored ventilation, respiratory function stable and patient connected to nasal cannula oxygen Cardiovascular status: blood pressure returned to baseline and stable Postop Assessment: no signs of nausea or vomiting Anesthetic complications: no    Last Vitals:  Filed Vitals:   04/12/15 1715 04/12/15 1725  BP: 137/83   Pulse: 64 60  Temp:    Resp: 12 11    Last Pain:  Filed Vitals:   04/12/15 1729  PainSc: [address]

## 2015-04-12 NOTE — Anesthesia Preprocedure Evaluation (Addendum)
Anesthesia Evaluation  Patient identified by MRN, date of birth, ID band Patient awake    Reviewed: Allergy & Precautions, H&P , NPO status , Patient's Chart, lab work & pertinent test results, reviewed documented beta blocker date and time   History of Anesthesia Complications (+) PONV  Airway Mallampati: III  TM Distance: >3 FB Neck ROM: Full    Dental no notable dental hx. (+) Teeth Intact, Dental Advisory Given   Pulmonary neg pulmonary ROS,    Pulmonary exam normal breath sounds clear to auscultation       Cardiovascular hypertension, Pt. on medications and Pt. on home beta blockers + dysrhythmias  Rhythm:Regular Rate:Normal     Neuro/Psych  Headaches, Anxiety Depression    GI/Hepatic Neg liver ROS, PUD, GERD  Medicated and Controlled,  Endo/Other  negative endocrine ROS  Renal/GU negative Renal ROS  negative genitourinary   Musculoskeletal  (+) Arthritis , Osteoarthritis,  Fibromyalgia -  Abdominal   Peds  Hematology negative hematology ROS (+)   Anesthesia Other Findings   Reproductive/Obstetrics negative OB ROS                            Anesthesia Physical Anesthesia Plan  ASA: III  Anesthesia Plan: General   Post-op Pain Management:    Induction: Intravenous  Airway Management Planned: Oral ETT  Additional Equipment:   Intra-op Plan:   Post-operative Plan: Extubation in OR  Informed Consent: I have reviewed the patients History and Physical, chart, labs and discussed the procedure including the risks, benefits and alternatives for the proposed anesthesia with the patient or authorized representative who has indicated his/her understanding and acceptance.   Dental advisory given  Plan Discussed with: CRNA  Anesthesia Plan Comments:         Anesthesia Quick Evaluation

## 2015-04-13 ENCOUNTER — Encounter (HOSPITAL_COMMUNITY): Payer: Self-pay | Admitting: Neurosurgery

## 2015-04-13 ENCOUNTER — Other Ambulatory Visit: Payer: Self-pay | Admitting: Licensed Clinical Social Worker

## 2015-04-13 ENCOUNTER — Other Ambulatory Visit: Payer: Self-pay

## 2015-04-13 DIAGNOSIS — I10 Essential (primary) hypertension: Secondary | ICD-10-CM | POA: Diagnosis not present

## 2015-04-13 DIAGNOSIS — K219 Gastro-esophageal reflux disease without esophagitis: Secondary | ICD-10-CM | POA: Diagnosis not present

## 2015-04-13 DIAGNOSIS — M4727 Other spondylosis with radiculopathy, lumbosacral region: Secondary | ICD-10-CM | POA: Diagnosis not present

## 2015-04-13 DIAGNOSIS — I499 Cardiac arrhythmia, unspecified: Secondary | ICD-10-CM | POA: Diagnosis not present

## 2015-04-13 MED ORDER — OXYCODONE-ACETAMINOPHEN 5-325 MG PO TABS: 1.0000 | ORAL_TABLET | Freq: Four times a day (QID) | ORAL | Status: DC | PRN

## 2015-04-13 NOTE — Patient Outreach (Signed)
[facility]) Care Management  04/13/2015  Ashley [name] 1965-04-30 [id number]   Outreach call #1 to patient for level of intervention follow-up on scheduled 04/12/15 back surgery.  Patient not reached.   RN CM left HIPAA compliant voice message with name and number for call back, RN CM notified [place] Liaison of [facility] hospital admission 04/12/15.   RN CM will schedule for next outreach call within one week.  Mariann Laster, RN, BSN, [facility], CCM  [employer] Management Coordinator [phone] Direct [phone] Cell [phone] Office [phone] Fax

## 2015-04-13 NOTE — Significant Event (Signed)
Patient's husband arrived to the unit with bruised on rt side of face and cuts on the back of right hand.. RN informed patient's spouse to go to the ER to get checked out and to make sure he's okay but he refused to the assistance given. Security was called to helped patient's spouse locate the area he fell and to look for his glasses which was lost during the fall. Patient was able to convince her husband to go with the security officer to the ER for evaluation. During this time patient was not satisfied with the medications for pain ordered for her after surgery . RN called MD on call to get new orders for patient. New order received and carried out and patient was informed about the new medications ordered. RN called the [facility] to informed about the situation and update going on with 3C05. Will continue to monitor.

## 2015-04-13 NOTE — Consult Note (Addendum)
   [facility] CM Inpatient Consult   04/13/2015  Ashley [name] Aug 13, 1965 [id number] Patient is currently active with [facility] for chronic disease management services, with her Medicare insurance. Patient admitted for lumbar spinal surgery and underwent a LEFT LUMBAR FIVE-SACRAL ONE LUMBAR decompressive laminectomy, to decompress the L5 and S1 roots. Patient has been engaged by a Librarian, academic  and CSW. Consent is active on file. Our community based plan of care has focused on disease management and community resource support. Will assign patient to RN community care Freight forwarder and remain active with [place] worker.  Patient will receive a post discharge transition of care call and will be evaluated for monthly home visits for assessments and disease process education.  Made unit nurse aware that [facility] following. Patient was trying to get her family members to come and pick up her car which is at the hospital. Nurse working with the patient on this issue.  States she may have a niece that maybe able to pick her up later this evening.  Of note, [facility] Care Management services does not replace or interfere with any services that are needed or arranged by inpatient case management or social work.  For additional questions or referrals please contact: Natividad Brood, RN BSN [facility] Liaison  [phone] business mobile phone Toll free office [phone]

## 2015-04-13 NOTE — Progress Notes (Signed)
Patient alert and oriented, mae's well, voiding adequate amount of urine, swallowing without difficulty, c/o mild pain. Patient discharged home with family. Script and discharged instructions given to patient. Patient and family stated understanding of d/c instructions given and has an appointment with MD.   

## 2015-04-13 NOTE — Discharge Instructions (Signed)
Lumbar Discectomy °Care After °A discectomy involves removal of discmaterial (the cartilage-like structures located between the bones of the back). It is done to relieve pressure on nerve roots. It can be used as a treatment for a back problem. The time in surgery depends on the findings in surgery and what is necessary to correct the problems. °HOME CARE INSTRUCTIONS  °· Check the cut (incision) made by the surgeon twice a day for signs of infection. Some signs of infection may include:  °· A foul smelling, greenish or yellowish discharge from the wound.  °· Increased pain.  °· Increased redness over the incision (operative) site.  °· The skin edges may separate.  °· Flu-like symptoms (problems).  °· A temperature above 101.5° F (38.6° C).  °· Change your bandages in about 24 to 36 hours following surgery or as directed.  °· You may shower tomrrow.  Avoid bathtubs, swimming pools and hot tubs for three weeks or until your incision has healed completely. °· Follow your doctor's instructions as to safe activities, exercises, and physical therapy.  °· Weight reduction may be beneficial if you are overweight.  °· Daily exercise is helpful to prevent the return of problems. Walking is permitted. You may use a treadmill without an incline. Cut down on activities and exercise if you have discomfort. You may also go up and down stairs as much as you can tolerate.  °· DO NOT lift anything heavier than 10 to 15 lbs. Avoid bending or twisting at the waist. Always bend your knees when lifting.  °· Maintain strength and range of motion as instructed.  °· Do not drive for 10 days, or as directed by your doctors. You may be a passenger . Lying back in the passenger seat may be more comfortable for you. Always wear a seatbelt.  °· Limit your sitting in a regular chair to 20 to 30 minutes at a time. There are no limitations for sitting in a recliner. You should lie down or walk in between sitting periods.  °· Only take  over-the-counter or prescription medicines for pain, discomfort, or fever as directed by your caregiver.  °SEEK MEDICAL CARE IF:  °· There is increased bleeding (more than a small spot) from the wound.  °· You notice redness, swelling, or increasing pain in the wound.  °· Pus is coming from wound.  °· You develop an unexplained oral temperature above 102° F (38.9° C) develops.  °· You notice a foul smell coming from the wound or dressing.  °· You have increasing pain in your wound.  °SEEK IMMEDIATE MEDICAL CARE IF:  °· You develop a rash.  °· You have difficulty breathing.  °· You develop any allergic problems to medicines given.  °Document Released: 12/19/2003 Document Revised: 01/02/2011 Document Reviewed: 04/08/2007 °ExitCare® Patient Information °

## 2015-04-13 NOTE — Patient Outreach (Signed)
Assessment:  CSW called client phone contact number on 04/13/15 and spoke via phone with client.  CSW verified client identity. CSW and Dazja spoke of client needs. Client is currently a patient at [facility] in [place], [place]. She recently underwent surgery on her back and is recovering from back surgery.  Client has spoken with CSW on several occasions regarding her back issues and difficulty with back pain.  CSW and client spoke of client care plan. Client agreed to try to attend all scheduled client medical appointments in the next 30 days. Client said she is taking medications as prescribed.  She has little family support. She and CSW spoke of client transport needs.  CSW encouraged Ashley [name] to call social work department at the hospital today and ask to talk with a hospital social worker about client need for transport assist to return home on hospital discharge. Ashley [name] said she would call hospital social work department today and request to talk with hospital social worker about possible assist for her with transport home for client (when discharged from the hospital). Client was appreciative of call from [place]. CSW thanked Ashley [name] for phone call with CSW on 04/13/15.    Plan: Client to attend all scheduled client medical appointments in next 30 days. CSW to call client in three weeks to assess client needs.  Ashley [name]. MSW, LCSW Licensed Clinical Social Worker [facility] Care Management [phone]

## 2015-04-13 NOTE — Discharge Summary (Signed)
Physician Discharge Summary  Patient ID: Ashley [name] MRN: [id number] DOB/AGE: Apr 10, 1965 50 y.o.  Admit date: 04/12/2015 Discharge date: 04/13/2015  Admission Diagnoses:osteoarthritis with radiculopathy lumbar Left L5/S1  Discharge Diagnoses:  Active Problems:   Osteoarthritis of spine with radiculopathy, lumbar region   Discharged Condition: good  Hospital Course: Ashley [name] was admitted and taken to the operating room for an uncomplicated lumbar foraminotomy and hemilaminectomy to decompress the L5 and S1 nerve roots. Post op she is ambulating, voiding, and tolerating a regular diet. Her wound is clean, dry, and without signs of infection.  Treatments: surgery: as above  Discharge Exam: Blood pressure 97/45, pulse 66, temperature 98.8 F (37.1 C), temperature source Oral, resp. rate 18, height 5\' 4"  (1.626 m), weight 82.555 kg (182 lb), SpO2 97 %. General appearance: alert, cooperative, appears stated age and mild distress Neurologic: Alert and oriented X 3, normal strength and tone. Normal symmetric reflexes. Normal coordination and gait  Disposition: 01-Home or Self Care lumbar osteoarthritis with radiculopathy    Medication List    TAKE these medications        ALPRAZolam 0.5 MG tablet  Commonly known as:  XANAX  Take 0.5 mg by mouth at bedtime as needed for anxiety.     calcium carbonate 750 MG chewable tablet  Commonly known as:  TUMS EX  Chew 1 tablet by mouth at bedtime as needed for heartburn.     FIORICET 50-325-40 MG tablet  Generic drug:  butalbital-acetaminophen-caffeine  Take 1 tablet by mouth every 4 (four) hours as needed for headache or migraine.     gabapentin 800 MG tablet  Commonly known as:  NEURONTIN  TAKE (1) TABLET BY MOUTH FOUR TIMES DAILY     ketorolac 10 MG tablet  Commonly known as:  TORADOL  Take 10 mg by mouth daily as needed for severe pain.     LORazepam 0.5 MG tablet  Commonly known as:  ATIVAN  Take 0.5 mg by mouth at  bedtime.     oxyCODONE-acetaminophen 5-325 MG tablet  Commonly known as:  PERCOCET/ROXICET  Take 1-2 tablets by mouth every 6 (six) hours as needed for moderate pain.     pantoprazole 40 MG tablet  Commonly known as:  PROTONIX  Take 40 mg by mouth daily. Reported on 04/04/2015     promethazine 25 MG tablet  Commonly known as:  PHENERGAN  Take 25 mg by mouth every 6 (six) hours as needed for nausea.     propranolol ER 80 MG 24 hr capsule  Commonly known as:  INDERAL LA  Take 80 mg by mouth daily.     topiramate 100 MG tablet  Commonly known as:  TOPAMAX  Take 150 mg by mouth at bedtime.     traMADol 50 MG tablet  Commonly known as:  ULTRAM  Take 50 mg by mouth every 8 (eight) hours as needed for moderate pain.     venlafaxine XR 150 MG 24 hr capsule  Commonly known as:  EFFEXOR XR  Take 1 capsule (150 mg total) by mouth daily with breakfast.           Follow-up Information    Follow up with , L, MD In 3 weeks.   Specialty:  Neurosurgery   Why:  call the office to make an appointment    Contact information:   [address] [place] [address] [phone]       Signed: Winfield Cunas 04/13/2015, 6:46 PM

## 2015-04-16 ENCOUNTER — Ambulatory Visit: Payer: Self-pay

## 2015-04-16 ENCOUNTER — Other Ambulatory Visit: Payer: Self-pay

## 2015-04-16 ENCOUNTER — Other Ambulatory Visit: Payer: Self-pay | Admitting: *Deleted

## 2015-04-16 NOTE — Patient Outreach (Signed)
04/16/15- Referral received from hospital (pt already active [facility] pt), discharged 04/13/15- Telephone call to patient for transition of care week 1, no answer to telephone, left voicemail requesting return phone call.  Jacqlyn Larsen [facility], [place] Coordinator [phone]

## 2015-04-16 NOTE — Patient Outreach (Signed)
[place] [facility]) Care Management  04/16/2015  Kaprice R Cammarata 1965-12-20 [id number]   Case closed to Telephonic RN CM services.  Patient assigned to [place] services for Transition of [employer]   Mariann Laster, RN, BSN, [facility], CCM  [employer] Management Coordinator [phone] Direct [phone] Cell [phone] Office [phone] Fax

## 2015-04-17 ENCOUNTER — Other Ambulatory Visit: Payer: Self-pay | Admitting: *Deleted

## 2015-04-17 ENCOUNTER — Ambulatory Visit: Payer: Self-pay | Admitting: *Deleted

## 2015-04-17 NOTE — Patient Outreach (Signed)
04/17/15-Telephone call to patient for transition of care week 1 (2nd attempt), no answer to telephone, left voicemail requesting return phone call.  Jacqlyn Larsen [facility], [place] Coordinator [phone]

## 2015-04-18 ENCOUNTER — Other Ambulatory Visit: Payer: Self-pay | Admitting: *Deleted

## 2015-04-18 ENCOUNTER — Encounter: Payer: Self-pay | Admitting: *Deleted

## 2015-04-18 NOTE — Patient Outreach (Signed)
04/18/15- Telephone call to patient for transition of care week 1 (3rd attempt), no answer to telephone, left voicemail requesting return phone call.  RN CM mailed unsuccessful outreach letter to patient's home and will keep case open for at least 10 days.  Jacqlyn Larsen [facility], [place] Coordinator [phone]

## 2015-04-23 ENCOUNTER — Other Ambulatory Visit: Payer: Self-pay | Admitting: *Deleted

## 2015-04-23 NOTE — Patient Outreach (Signed)
04/23/15- Telephone call to patient for transition of care week 1, inpatient admission 3/16-3/17/17 for surgical procedure- laminectomy- RN CM tried multiple times to reach pt after discharge.  RN CM spoke with pt, HIPAA verified, pt reports she is "doing pretty well"  Pt to see surgeon on 05/03/15 and plans to call primary care MD to schedule follow up appointment.  Pt reports she has blood pressure monitor at home and is checking blood pressure every few days and states "the readings have been pretty good"  Pt reports she is driving now and her husband has been assisting her in the home. Pt report she has anxiety disorder and "this is my main problem right now", [facility] CSW is currently working with pt as well.  Pt agreeable to home visit.   THN CM Care Plan Problem One        Most Recent Value   Care Plan Problem One  Difficulty managing blood pressure aeb elevated blood pressure   Care Plan for Problem One  Active   THN Long Term Goal (31-90 days)  Patient will get BP down by 10 points systolic and diastolic over the next 99991111 days.    THN Long Term Goal Start Date  04/09/15   Interventions for Problem One Long Term Goal  RN CM reviewed importance of consistently checking blood pressure at home and keeping log.   THN CM Short Term Goal #1 (0-30 days)  Patient will report elevated BP readings to MD and schedule appt's as warranted over the next 30 days.    THN CM Short Term Goal #1 Start Date  04/09/15     PLAN See pt for initial home visit 04/27/15 Assess blood pressure, medications Collaborate with CSW as needed  Jacqlyn Larsen [facility], [place] Coordinator [phone]

## 2015-04-24 ENCOUNTER — Encounter: Payer: Self-pay | Admitting: *Deleted

## 2015-04-25 IMAGING — RF DG C-ARM 61-120 MIN
1 series · 2 of 2 positions shown · non-contrast
Comparison: Lumbar spine myelogram 10/19/2013.

CLINICAL DATA: L4-5 PLIF. Intraoperative guidance. Herniated lumbar
disc.

EXAM:
DG C-ARM 61-120 MIN; LUMBAR SPINE - 2-3 VIEW

[Series 1: run · 2 of 2 slices shown]
[im 1/2]
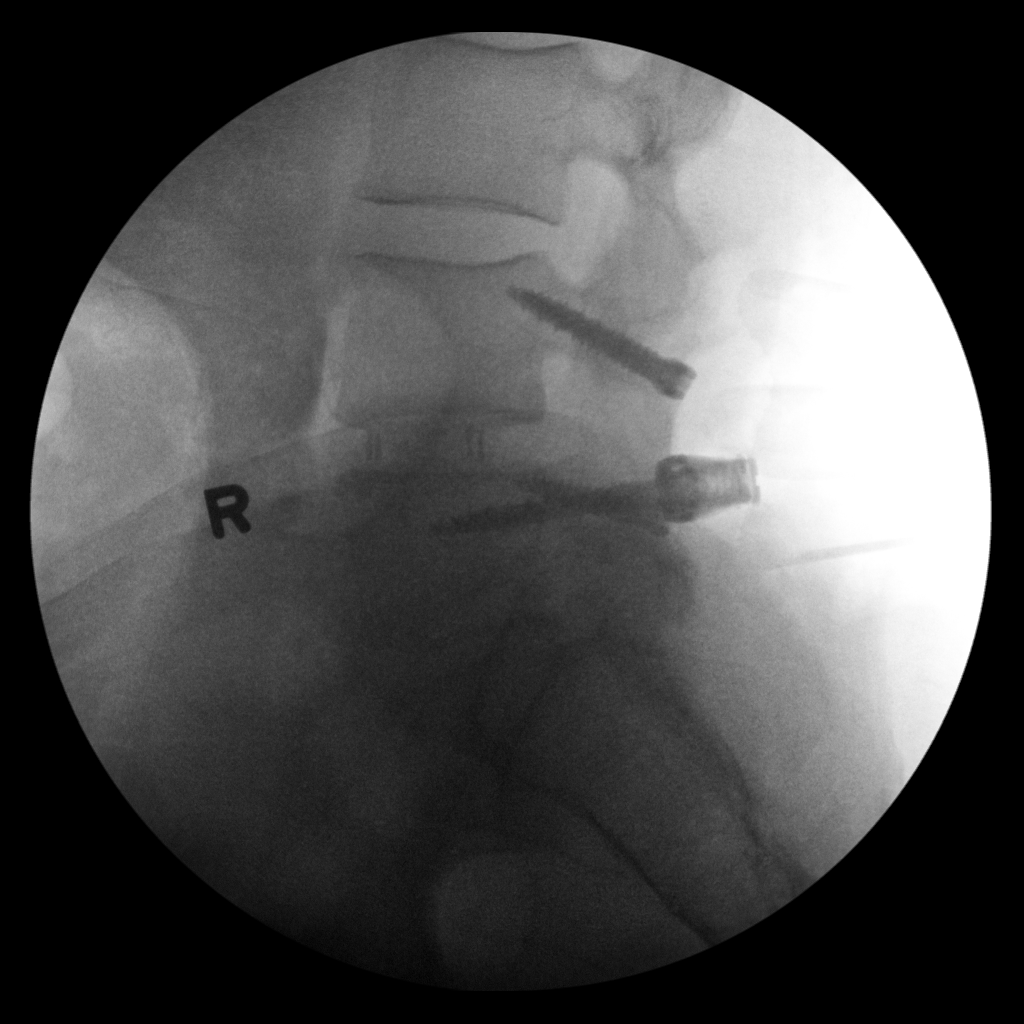
[im 2/2]
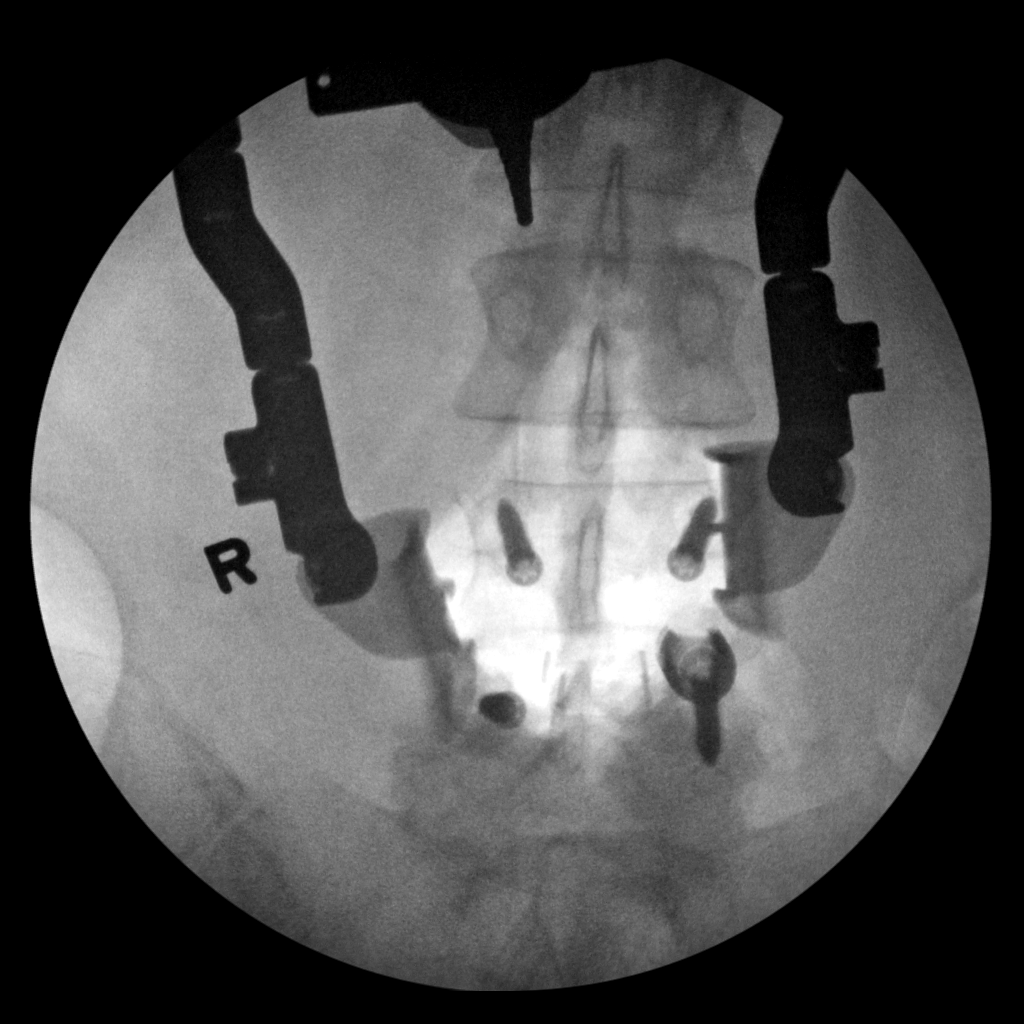

[2 of 2 positions shown; findings below may reference images not displayed]

FINDINGS: Two intraoperative views are submitted. Pedicle screws are placed
bilaterally at the L4 and L5 levels. The right pedicle screw at L5
is within the superior aspect of the pedicle, but appears to be
contained within bone. The disc spacers in place. Disc height is
restored.
IMPRESSION: Status post L4-5 discectomy with disc spacers in place.

Pedicle screw placement without complication.

## 2015-04-27 ENCOUNTER — Other Ambulatory Visit: Payer: Self-pay | Admitting: *Deleted

## 2015-04-27 ENCOUNTER — Encounter: Payer: Self-pay | Admitting: *Deleted

## 2015-04-27 NOTE — Patient Outreach (Signed)
[place] [facility]) Care Management   04/27/2015  Ashley [name] 05/26/1965 [id number]  Ashley [name] is an 50 y.o. 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 Pt reports she does have some depression but is not going back to therapist due to cannot afford.   Objective:  Filed Vitals:   04/27/15 1205  BP: 118/64  Pulse: 69  Resp: 18  Height: 1.626 m (5\' 4" )  Weight: 183 lb (83.008 kg)  SpO2: 98%    ROS  Physical Exam  Constitutional: She is oriented to person, place, and time. She appears well-developed and well-nourished.  HENT:  Head: Normocephalic.  Neck: Normal range of motion. Neck supple.  Cardiovascular: Normal rate and regular rhythm.   Respiratory: Effort normal and breath sounds normal.  GI: Soft. Bowel sounds are normal.  Musculoskeletal: Normal range of motion. She exhibits no edema.  Neurological: She is alert and oriented to person, place, and time.  Skin: Skin is warm and dry.  Petechiae noted to area around incision to lower back. Incision is well approximated, no signs/ symptoms infection, no drainage noted.  Psychiatric: She has a normal mood and affect. Her behavior is normal. Judgment and thought content normal.    Current Medications:   Current Outpatient Prescriptions  Medication Sig Dispense Refill  . ALPRAZolam (XANAX) 0.5 MG tablet Take 0.5 mg by mouth at bedtime as needed for anxiety.    . butalbital-acetaminophen-caffeine (FIORICET) 50-325-40 MG per tablet Take 1 tablet by mouth every 4 (four) hours as needed for headache or migraine.    . gabapentin (NEURONTIN) 800 MG tablet TAKE (1) TABLET BY MOUTH FOUR TIMES DAILY 120 tablet 6  . LORazepam (ATIVAN) 0.5 MG tablet Take 0.5 mg by mouth at bedtime.    Marland Kitchen oxyCODONE-acetaminophen  (PERCOCET/ROXICET) 5-325 MG tablet Take 1-2 tablets by mouth every 6 (six) hours as needed for moderate pain. 70 tablet 0  . promethazine (PHENERGAN) 25 MG tablet Take 25 mg by mouth every 6 (six) hours as needed for nausea.     . propranolol ER (INDERAL LA) 80 MG 24 hr capsule Take 80 mg by mouth daily.    Marland Kitchen topiramate (TOPAMAX) 100 MG tablet Take 150 mg by mouth at bedtime.    . traMADol (ULTRAM) 50 MG tablet Take 50 mg by mouth every 6 (six) hours as needed for moderate pain (1-2 tablets prn for pain).     Marland Kitchen venlafaxine XR (EFFEXOR XR) 150 MG 24 hr capsule Take 1 capsule (150 mg total) by mouth daily with breakfast. 30 capsule 3  . calcium carbonate (TUMS EX) 750 MG chewable tablet Chew 1 tablet by mouth at bedtime as needed for heartburn. Reported on 04/27/2015    . pantoprazole (PROTONIX) 40 MG tablet Take 40 mg by mouth daily. Reported on 04/27/2015     No current facility-administered medications for this visit.    Functional Status:   In your present state of health, do you have any difficulty performing the following activities: 04/27/2015 04/10/2015  Hearing? N N  Vision? N N  Difficulty concentrating or making decisions? N N  Walking or climbing stairs? Y Y  Dressing or bathing? N N  Doing errands, shopping? Y -  Conservation officer, nature and eating ? N -  Using the Toilet? N -  In the past six months, have you accidently leaked urine? N -  Do you have problems with loss of bowel control? N -  Managing your Medications? N -  Managing your Finances? N -  Housekeeping or managing your Housekeeping? N -    Fall/Depression Screening:    PHQ 2/9 Scores 04/27/2015 04/09/2015 02/09/2015 01/05/2015 10/27/2014  PHQ - 2 Score 2 2 2 2 2   PHQ- 9 Score 3 3 4 4 4    Fall Risk  04/27/2015 04/09/2015 02/09/2015 01/05/2015 12/29/2014  Falls in the past year? No No Yes Yes -  Number falls in past yr: - - 1 1 (No Data)  Injury with Fall? - - No No -  Risk Factor Category  - - High Fall Risk High Fall Risk -   Risk for fall due to : Medication side effect Impaired balance/gait;Impaired mobility;Medication side effect;Other (Comment) Impaired mobility;History of fall(s);Impaired balance/gait History of fall(s);Impaired balance/gait;Impaired mobility -  Risk for fall due to (comments): - Chronic pain with decreased mobility.  - - -  Follow up - - Falls prevention discussed Falls prevention discussed -    Assessment:  RN CM sent In Basket to [place] reporting pt cannot afford to see therapist for mental health counseling.  RN CM faxed today's initial home visit and barrier letter to primary MD Dr. Quintin Alto.  RN CM faxed note to Dr. Christella Noa reporting pt has petechiae and itching to skin around lower back incision.  THN CM Care Plan Problem One        Most Recent Value   Care Plan Problem One  Difficulty managing blood pressure aeb elevated blood pressure   Role Documenting the Problem One  Care Management Coordinator   Care Plan for Problem One  Active   THN Long Term Goal (31-90 days)  Patient will get BP down by 10 points systolic and diastolic over the next 99991111 days.    THN Long Term Goal Start Date  04/09/15   Interventions for Problem One Long Term Goal  RN CM talked with pt about normal parameters for blood pressure and stress, diet, etc can elevate blood pressure.   THN CM Short Term Goal #1 (0-30 days)  Patient will report elevated BP readings to MD and schedule appt's as warranted over the next 30 days.    THN CM Short Term Goal #1 Start Date  04/09/15   Interventions for Short Term Goal #1  RN CM encouraged pt to make apointment with primary care MD (pt states she will do this),  RN CM reviewed blood pressure medications.   THN CM Short Term Goal #2 (0-30 days)  Pt will continue checking blood pressure several times per week and keep log in [facility] calendar.   THN CM Short Term Goal #2 Start Date  04/27/15   Interventions for Short Term Goal #2  RN CM reviewed "hypertension" section in  [facility] calendar and encouraged pt to log blood pressure and take with her to MD appointments.      Plan:  Follow up with home visit 4/ 20/17 Assess blood pressure log Follow up on pt visit with surgeon  Jacqlyn Larsen [facility] BSN [place] Coordinator [phone]

## 2015-05-03 ENCOUNTER — Other Ambulatory Visit: Payer: Self-pay | Admitting: *Deleted

## 2015-05-03 NOTE — Patient Outreach (Signed)
05/03/15- telephone call to patient for transition of care week 2, spoke with patient, HIPAA verified, pt reports she is to see surgeon Dr. Christella Noa today, pt states " back incision feels better, not itching now"  , pt reports she is monitoring blood pressure with reading yesterday 148/91 and plans to show blood pressure log to MD.  Pt reports back pain "is fine", pt states she does have some issues with neck pain and will discuss this with Dr. Christella Noa today.  Pt reports if MD clears her to exercise she may try to get a scholarship through [facility].  No new issues or concerns reported.  THN CM Care Plan Problem One        Most Recent Value   Care Plan Problem One  Difficulty managing blood pressure aeb elevated blood pressure   Role Documenting the Problem One  Care Management Coordinator   Care Plan for Problem One  Active   THN Long Term Goal (31-90 days)  Patient will get BP down by 10 points systolic and diastolic over the next 99991111 days.    THN Long Term Goal Start Date  04/09/15   Interventions for Problem One Long Term Goal  RN CM discussed correlation of high salt diet and effect on blood pressure   THN CM Short Term Goal #1 (0-30 days)  Patient will report elevated BP readings to MD and schedule appt's as warranted over the next 30 days.    THN CM Short Term Goal #1 Start Date  04/09/15   THN CM Short Term Goal #2 (0-30 days)  Pt will continue checking blood pressure several times per week and keep log in [facility] calendar.   THN CM Short Term Goal #2 Start Date  04/27/15     PLAN Continue weekly transition of care calls  Jacqlyn Larsen [facility], [place] Coordinator [phone]

## 2015-05-04 ENCOUNTER — Other Ambulatory Visit: Payer: Self-pay | Admitting: Licensed Clinical Social Worker

## 2015-05-04 NOTE — Patient Outreach (Signed)
Assessment:  CSW spoke via phone with client on 05/04/15. CSW verified client identity. CSW and client spoke of client needs. Client said she has her prescribed medications and is taking medications as prescribed. She is eating adequately and sleeping adequately. She has little family support. In fact, over recent months she has been helping to provide care needed for her spouse. She and her spouse reside at different residences. Nashiya and CSW have spoken of relaxation techniques she can use to help her manage anxiety and stress symptoms. Client likes to cook her favorite foods to help her relax. She likes to take a walk or listen to music to help her manage anxiety and stress. CSW again encouraged Rosea to use relaxation techniques to help her manage anxiety and stress symptoms. CSW also encouraged client to attend all scheduled client medical appointments in the next 30 days as part of her care plan goal. Mareli said she will try to attend all scheduled client medical appointments in the next 30 days.  Client was recently hospitalized at [facility]. She underwent procedure on her back and has now discharged from the hospital back to her home.  Client reported that she is adjusting to being back in her home. Client is receiving [facility] nursing support with RN Jacqlyn Larsen. CSW encouraged Hal[place] to call CSW at [phone] as needed to discuss social work needs of client.   Plan:  Client to attend all scheduled client medical appointments in the next 30 days. CSW to call client in 4 weeks to assess client needs.   [name]. MSW, LCSW Licensed Clinical Social Worker [facility] Care Management [phone]

## 2015-05-07 ENCOUNTER — Other Ambulatory Visit: Payer: Self-pay | Admitting: *Deleted

## 2015-05-07 ENCOUNTER — Ambulatory Visit: Payer: Medicare Other

## 2015-05-07 NOTE — Patient Outreach (Signed)
05/07/15- Telephone call to patient for transition of care week 3, spoke with pt, HIPAA verified, pt reports she did follow up with surgeon and is to return in 3 months. Pt states she "got good report and incision is okay"  Pt reports surgeon instructed she could walk for exercise.  Pt states she will be following up with surgeon for neck pain and states she did discuss with pt.  Pt reports she continues checking blood pressure and reading yesterday 132/78 and today 125/75.    THN CM Care Plan Problem One        Most Recent Value   Care Plan Problem One  Difficulty managing blood pressure aeb elevated blood pressure   Role Documenting the Problem One  Care Management Coordinator   Care Plan for Problem One  Active   THN Long Term Goal (31-90 days)  Patient will get BP down by 10 points systolic and diastolic over the next 99991111 days.    THN Long Term Goal Start Date  04/09/15   Interventions for Problem One Long Term Goal  RN CM reinforced correlation of high salt diet and effect on blood pressure   THN CM Short Term Goal #1 (0-30 days)  Patient will report elevated BP readings to MD and schedule appt's as warranted over the next 30 days.    THN CM Short Term Goal #1 Start Date  04/09/15   Interventions for Short Term Goal #1  RN CM verified next primary care appointment which pt states is in May   [facility] CM Short Term Goal #2 (0-30 days)  Pt will continue checking blood pressure several times per week and keep log in [facility] calendar.   THN CM Short Term Goal #2 Start Date  04/27/15   Interventions for Short Term Goal #2  RN CM reminded pt of checking blood sugar and logging      PLAN Follow up with transition of care 05/14/15  Jacqlyn Larsen [facility], [place] Coordinator [phone]

## 2015-05-14 ENCOUNTER — Ambulatory Visit: Payer: Self-pay | Admitting: *Deleted

## 2015-05-15 ENCOUNTER — Encounter: Payer: Self-pay | Admitting: Neurology

## 2015-05-15 ENCOUNTER — Ambulatory Visit (INDEPENDENT_AMBULATORY_CARE_PROVIDER_SITE_OTHER): Payer: Medicare Other | Admitting: Neurology

## 2015-05-15 VITALS — BP 138/84 | HR 60 | Ht 64.0 in | Wt 182.0 lb

## 2015-05-15 DIAGNOSIS — M545 Low back pain: Secondary | ICD-10-CM | POA: Diagnosis not present

## 2015-05-15 DIAGNOSIS — M797 Fibromyalgia: Secondary | ICD-10-CM

## 2015-05-15 DIAGNOSIS — M5417 Radiculopathy, lumbosacral region: Secondary | ICD-10-CM
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
## 2015-05-17 ENCOUNTER — Other Ambulatory Visit: Payer: Self-pay | Admitting: *Deleted

## 2015-05-17 ENCOUNTER — Encounter: Payer: Self-pay | Admitting: *Deleted

## 2015-05-17 NOTE — Patient Outreach (Signed)
[facility]) Care Management   05/17/2015  Ashley [name] 1965/05/14 [id number]  Ashley [name] is an 50 y.o. female  Subjective: Routine home visit with pt, HIPAA verified, pt reports back incision healed and pt is doing light walking.  Pt saw Dr. Jannifer Franklin (neurology) on 05/15/15 for neck pain and intermittent headaches.  Pt states she is going to outpatient therapy on one time basis to get a retraction brace for home use to help with neck pain.  Pt reports she is checking blood pressure several times per week and has not been logging "but will try to do better with this"  Objective:     Filed Vitals:   05/17/15 1257  BP: 126/68  Pulse: 70  Resp: 16  SpO2: 98%     ROS  Physical Exam  Constitutional: She is oriented to person, place, and time. She appears well-developed and well-nourished.  HENT:  Head: Normocephalic.  Neck: Normal range of motion. Neck supple.  Cardiovascular: Normal rate and regular rhythm.   Respiratory: Effort normal and breath sounds normal.  GI: Soft. Bowel sounds are normal.  Musculoskeletal: Normal range of motion. She exhibits no edema.  Neurological: She is alert and oriented to person, place, and time.  Skin: Skin is warm and dry.  Lower back incision healed, well approximated, no signs/ symptoms infection.  Psychiatric: She has a normal mood and affect. Her behavior is normal. Judgment and thought content normal.    Encounter Medications:   Outpatient Encounter Prescriptions as of 05/17/2015  Medication Sig Note  . ALPRAZolam (XANAX) 0.5 MG tablet Take 0.5 mg by mouth at bedtime as needed for anxiety.   . butalbital-acetaminophen-caffeine (FIORICET) 50-325-40 MG per tablet Take 1 tablet by mouth every 4 (four) hours as needed for headache or migraine.   . calcium carbonate (TUMS EX) 750 MG chewable tablet Chew 1 tablet by mouth at bedtime as needed for heartburn. Reported on 04/27/2015   . cyclobenzaprine (FLEXERIL) 10 MG tablet  daily as needed. Reported on 05/15/2015 05/15/2015: Received from: External Pharmacy  . gabapentin (NEURONTIN) 800 MG tablet TAKE (1) TABLET BY MOUTH FOUR TIMES DAILY   . LORazepam (ATIVAN) 0.5 MG tablet Take 0.5 mg by mouth at bedtime.   . promethazine (PHENERGAN) 25 MG tablet Take 25 mg by mouth every 6 (six) hours as needed for nausea.    . propranolol ER (INDERAL LA) 80 MG 24 hr capsule Take 80 mg by mouth daily.   Marland Kitchen topiramate (TOPAMAX) 100 MG tablet Take 150 mg by mouth at bedtime.   . traMADol (ULTRAM) 50 MG tablet Take 50 mg by mouth every 6 (six) hours as needed for moderate pain (1-2 tablets prn for pain).    Marland Kitchen venlafaxine XR (EFFEXOR XR) 150 MG 24 hr capsule Take 1 capsule (150 mg total) by mouth daily with breakfast.   . pantoprazole (PROTONIX) 40 MG tablet Take 40 mg by mouth daily. Reported on 05/17/2015 04/04/2015: Needs Rx   . [DISCONTINUED] oxyCODONE-acetaminophen (PERCOCET/ROXICET) 5-325 MG tablet Take 1-2 tablets by mouth every 6 (six) hours as needed for moderate pain. (Patient not taking: Reported on 05/15/2015)    No facility-administered encounter medications on file as of 05/17/2015.    Functional Status:   In your present state of health, do you have any difficulty performing the following activities: 04/27/2015 04/10/2015  Hearing? N N  Vision? N N  Difficulty concentrating or making decisions? N N  Walking or climbing stairs? Tempie Donning  Dressing  or bathing? N N  Doing errands, shopping? Y -  Conservation officer, nature and eating ? N -  Using the Toilet? N -  In the past six months, have you accidently leaked urine? N -  Do you have problems with loss of bowel control? N -  Managing your Medications? N -  Managing your Finances? N -  Housekeeping or managing your Housekeeping? N -    Fall/Depression Screening:    PHQ 2/9 Scores 04/27/2015 04/09/2015 02/09/2015 01/05/2015 10/27/2014  PHQ - 2 Score 2 2 2 2 2   PHQ- 9 Score 3 3 4 4 4     Assessment:  Pt continues following up with surgeon,  neurologist and will see primary care in May, pt owes money and is working on paying off the bill.  Pt to work on keeping log of blood pressure in [facility] calendar and taking to MD appointments.  RN CM discussed discharge plan and will see pt once more next month then transfer to health coach if needed and if pt agrees.  THN CM Care Plan Problem One        Most Recent Value   Care Plan Problem One  Difficulty managing blood pressure aeb elevated blood pressure   Role Documenting the Problem One  Care Management Coordinator   Care Plan for Problem One  Active   THN Long Term Goal (31-90 days)  Patient will get BP down by 10 points systolic and diastolic over the next 99991111 days.    THN Long Term Goal Start Date  04/09/15   Interventions for Problem One Long Term Goal  RN CM reviewed things that can elevate blood pressure such as high sodium intake, stress and importance of making healthy choices daily.   THN CM Short Term Goal #1 (0-30 days)  Patient will report elevated BP readings to MD and schedule appt's as warranted over the next 30 days.    THN CM Short Term Goal #1 Start Date  05/17/15 [goal restarted- pt has not seen primary MD, will see in May]   Interventions for Short Term Goal #1  RN CM reiterated importance of seeing primary care MD in May and taking blood pressure log   THN CM Short Term Goal #2 (0-30 days)  Pt will continue checking blood pressure several times per week and keep log in [facility] calendar.   THN CM Short Term Goal #2 Start Date  05/17/15 Barrie Folk restarted- pt not logging]   Interventions for Short Term Goal #2  RN CM reviewed importance of checking blood pressure and keeping log,, pt is checking several times per week and to work on keeping log.      Plan: follow up with home visit 06/14/15 Assess blood pressure log Plan to discharge or transfer to health coach  Jacqlyn Larsen [facility], BSN [place] Coordinator [phone]

## 2015-05-22 ENCOUNTER — Telehealth: Payer: Self-pay

## 2015-05-22 NOTE — Telephone Encounter (Signed)
[phone]   PATIENT RECEIVED LETTER TO SCHEDULE TCS

## 2015-05-30 DIAGNOSIS — F411 Generalized anxiety disorder: Secondary | ICD-10-CM | POA: Diagnosis not present

## 2015-05-30 DIAGNOSIS — M545 Low back pain: Secondary | ICD-10-CM | POA: Diagnosis not present

## 2015-05-30 DIAGNOSIS — F3341 Major depressive disorder, recurrent, in partial remission: Secondary | ICD-10-CM | POA: Diagnosis not present

## 2015-05-30 DIAGNOSIS — G43809 Other migraine, not intractable, without status migrainosus: Secondary | ICD-10-CM | POA: Diagnosis not present

## 2015-05-30 DIAGNOSIS — G4709 Other insomnia: Secondary | ICD-10-CM | POA: Diagnosis not present

## 2015-05-30 DIAGNOSIS — R0789 Other chest pain: Secondary | ICD-10-CM | POA: Diagnosis not present

## 2015-05-30 DIAGNOSIS — E6609 Other obesity due to excess calories: Secondary | ICD-10-CM | POA: Diagnosis not present

## 2015-05-31 ENCOUNTER — Other Ambulatory Visit: Payer: Self-pay | Admitting: Licensed Clinical Social Worker

## 2015-05-31 DIAGNOSIS — M549 Dorsalgia, unspecified: Secondary | ICD-10-CM | POA: Diagnosis not present

## 2015-05-31 DIAGNOSIS — M542 Cervicalgia: Secondary | ICD-10-CM | POA: Diagnosis not present

## 2015-05-31 DIAGNOSIS — M799 Soft tissue disorder, unspecified: Secondary | ICD-10-CM | POA: Diagnosis not present

## 2015-05-31 DIAGNOSIS — M6281 Muscle weakness (generalized): Secondary | ICD-10-CM | POA: Diagnosis not present

## 2015-05-31 NOTE — Patient Outreach (Signed)
Assessment:  CSW spoke via phone with client on 05/31/15. CSW verified client identity. CSW and client spoke of client needs. Client said she has prescribed medications and is taking medications as prescribed.  Client is eating adequately and said she does not sleep very well.  CSW and [place] spoke of client care plan. CSW encouraged client to attend all scheduled client medical appointments in next 30 days.  Client said she has been trying to attend all scheduled appointments. She is recovering from recent back surgery.  Client still tries to provide some care support for spouse. She has no transport needs at present.  Client is receiving [facility] nursing support with RN Jacqlyn Larsen. Client said she went today to an outpatient physical therapy session.  She said her back is starting to feel better; but she said she has headaches at present. She said she had an appointment with Dr. Quintin Alto yesterday.  She said she has talked with Dr. Quintin Alto about her headaches issue.  She said she has also talked to a neurologist about her headache issues.  She said she has to pay a copay each time she goes to outpatient physical therapy session. She said physical therapist spoke with her about use of a traction machine. She said physical therapist shared with her the cost of a traction machine. Client said she could not afford to buy a traction machine to use in her home. She said she talked with physical therapist about payment schedule to possibly allow client to go to some outpatient therapy sessions for client each week.  Client said physical therapy session today helped her.  CSW reminded client to call CSW at [phone] to discuss social work needs of client. CSW thanked Ashley [name] for phone conversation with CSW on 05/31/15.   Plan:  Client to attend scheduled client medical appointments in next 30 days. CSW to collaborate with RN Jacqlyn Larsen as needed to monitor client needs. CSW to call client in 3 weeks to assess needs of  client.  [name]. MSW, LCSW Licensed Clinical Social Worker [facility] Care Management [phone]

## 2015-06-05 NOTE — Telephone Encounter (Signed)
Tried to call with no answer  

## 2015-06-07 DIAGNOSIS — M25532 Pain in left wrist: Secondary | ICD-10-CM | POA: Diagnosis not present

## 2015-06-07 DIAGNOSIS — S52552P Other extraarticular fracture of lower end of left radius, subsequent encounter for closed fracture with malunion: Secondary | ICD-10-CM | POA: Diagnosis not present

## 2015-06-07 DIAGNOSIS — M25432 Effusion, left wrist: Secondary | ICD-10-CM | POA: Diagnosis not present

## 2015-06-07 NOTE — Telephone Encounter (Signed)
Pt is going to come in for an office visit since she has GERD because she would like to have an EGD also.

## 2015-06-07 NOTE — Addendum Note (Signed)
Addended by: Marlou Porch on: 06/07/2015 03:29 PM   Modules accepted: Medications

## 2015-06-07 NOTE — Telephone Encounter (Signed)
Gastroenterology Pre-Procedure Review  Request Date: Requesting Physician:   PATIENT REVIEW QUESTIONS: The patient responded to the following health history questions as indicated:    1. Diabetes Melitis: NO 2. Joint replacements in the past 12 months: NO 3. Major health problems in the past 3 months: [place] 16,2017(L4,L5) 4. Has an artificial valve or MVP: NO 5. Has a defibrillator: NO 6. Has been advised in past to take antibiotics in advance of a procedure like teeth cleaning: NO 7. Family history of colon cancer: NO 8. Alcohol Use: NO 9. History of sleep apnea: NO    MEDICATIONS & ALLERGIES:    Patient reports the following regarding taking any blood thinners:   Plavix? NO Aspirin? NO Coumadin? NO  Patient confirms/reports the following medications:  Current Outpatient Prescriptions  Medication Sig Dispense Refill  . ALPRAZolam (XANAX) 0.5 MG tablet Take 0.5 mg by mouth at bedtime as needed for anxiety.    . butalbital-acetaminophen-caffeine (FIORICET) 50-325-40 MG per tablet Take 1 tablet by mouth every 4 (four) hours as needed for headache or migraine.    . calcium carbonate (TUMS EX) 750 MG chewable tablet Chew 1 tablet by mouth at bedtime as needed for heartburn. Reported on 04/27/2015    . cyclobenzaprine (FLEXERIL) 10 MG tablet daily as needed. Reported on 05/15/2015    . gabapentin (NEURONTIN) 800 MG tablet TAKE (1) TABLET BY MOUTH FOUR TIMES DAILY 120 tablet 6  . promethazine (PHENERGAN) 25 MG tablet Take 25 mg by mouth every 6 (six) hours as needed for nausea.     . propranolol ER (INDERAL LA) 80 MG 24 hr capsule Take 80 mg by mouth daily.    Marland Kitchen topiramate (TOPAMAX) 100 MG tablet Take 150 mg by mouth at bedtime.    . traMADol (ULTRAM) 50 MG tablet Take 50 mg by mouth every 6 (six) hours as needed for moderate pain (1-2 tablets prn for pain).     Marland Kitchen venlafaxine XR (EFFEXOR XR) 150 MG 24 hr capsule Take 1 capsule (150 mg total) by mouth daily with breakfast.  30 capsule 3  . LORazepam (ATIVAN) 0.5 MG tablet Take 0.5 mg by mouth at bedtime. Reported on 06/07/2015    . pantoprazole (PROTONIX) 40 MG tablet Take 40 mg by mouth daily. Reported on 06/07/2015     No current facility-administered medications for this visit.    Patient confirms/reports the following allergies:  Allergies  Allergen Reactions  . Bee Venom Anaphylaxis  . Sulfa Antibiotics Anaphylaxis and Swelling    Tongue swelling  . Sulfamethoxazole Anaphylaxis and Swelling    Tongue Swelling  . Prednisone Swelling    Tablet only  . Latex Rash  . Morphine And Related Nausea And Vomiting and Other (See Comments)    Causes headaches    No orders of the defined types were placed in this encounter.    AUTHORIZATION INFORMATION Primary Insurance: MEDICARE,  [place] #VO:[id number],  Group #:  Pre-Cert / Auth required:  Pre-Cert / Auth #:    SCHEDULE INFORMATION: Procedure has been scheduled as follows:  Date: , Time:  Location:   This Gastroenterology Pre-Precedure Review Form is being routed to the following provider(s):

## 2015-06-11 NOTE — Telephone Encounter (Signed)
Noted  

## 2015-06-14 ENCOUNTER — Encounter: Payer: Self-pay | Admitting: *Deleted

## 2015-06-14 ENCOUNTER — Other Ambulatory Visit: Payer: Self-pay | Admitting: *Deleted

## 2015-06-14 NOTE — Patient Outreach (Signed)
[facility]) Care Management   06/14/2015  Ashley [name] 12-11-1965 [id number]  Ashley [name] is an 50 y.o. female  Subjective: Routine home visit with pt, HIPAA verified, pt reports she saw primary MD in May and no changes made, no medication changes. Pt states she continues to check CBG several times per week and has not been recording and most likely will not, pt states "blood pressure has been good overall"  Pt states she was unable to get retraction brace because of cost 679$.  Pt states she plans to talk with neurosurgeon about possibly writing a letter to medicare to see if this will help.  Pt reports back incision healed and no issues with this.   Objective:   Filed Vitals:   06/14/15 1219  BP: 140/68  Pulse: 70  Resp: 16  SpO2: 98%   ROS  Physical Exam  Constitutional: She is oriented to person, place, and time. She appears well-developed and well-nourished.  HENT:  Head: Normocephalic.  Neck: Normal range of motion. Neck supple.  Cardiovascular: Normal rate and regular rhythm.   Respiratory: Effort normal and breath sounds normal.  GI: Soft. Bowel sounds are normal.  Musculoskeletal: Normal range of motion. She exhibits edema.  1+ edema left foot area, pt states foot swells when she drives the car as she has been doing today.  Neurological: She is alert and oriented to person, place, and time.  Skin: Skin is warm and dry.  Psychiatric: She has a normal mood and affect. Her behavior is normal. Judgment and thought content normal.    Encounter Medications:   Outpatient Encounter Prescriptions as of 06/14/2015  Medication Sig Note  . ALPRAZolam (XANAX) 0.5 MG tablet Take 0.5 mg by mouth at bedtime as needed for anxiety.   . butalbital-acetaminophen-caffeine (FIORICET) 50-325-40 MG per tablet Take 1 tablet by mouth every 4 (four) hours as needed for headache or migraine.   . calcium carbonate (TUMS EX) 750 MG chewable tablet Chew 1 tablet by mouth at  bedtime as needed for heartburn. Reported on 04/27/2015   . cyclobenzaprine (FLEXERIL) 10 MG tablet daily as needed. Reported on 05/15/2015 05/15/2015: Received from: External Pharmacy  . gabapentin (NEURONTIN) 800 MG tablet TAKE (1) TABLET BY MOUTH FOUR TIMES DAILY   . LORazepam (ATIVAN) 0.5 MG tablet Take 0.5 mg by mouth at bedtime. Reported on 06/07/2015   . promethazine (PHENERGAN) 25 MG tablet Take 25 mg by mouth every 6 (six) hours as needed for nausea.    . propranolol ER (INDERAL LA) 80 MG 24 hr capsule Take 80 mg by mouth daily.   Marland Kitchen topiramate (TOPAMAX) 100 MG tablet Take 150 mg by mouth at bedtime.   . traMADol (ULTRAM) 50 MG tablet Take 50 mg by mouth every 6 (six) hours as needed for moderate pain (1-2 tablets prn for pain).    Marland Kitchen venlafaxine XR (EFFEXOR XR) 150 MG 24 hr capsule Take 1 capsule (150 mg total) by mouth daily with breakfast.   . pantoprazole (PROTONIX) 40 MG tablet Take 40 mg by mouth daily. Reported on 06/14/2015 04/04/2015: Needs Rx    No facility-administered encounter medications on file as of 06/14/2015.    Functional Status:   In your present state of health, do you have any difficulty performing the following activities: 04/27/2015 04/10/2015  Hearing? N N  Vision? N N  Difficulty concentrating or making decisions? N N  Walking or climbing stairs? Y Y  Dressing or bathing? N N  Doing errands, shopping? Y -  Conservation officer, nature and eating ? N -  Using the Toilet? N -  In the past six months, have you accidently leaked urine? N -  Do you have problems with loss of bowel control? N -  Managing your Medications? N -  Managing your Finances? N -  Housekeeping or managing your Housekeeping? 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  Pt reports taking all medications as prescribed and is attending MD appointments, pt is able to drive.  Pt saw eye doctor one week ago and got new contacts.  Pt continues to have headaches at times and uses cold compresses and takes pain medication with relief usually obtained.  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  RN CM faxed case closure letter to primary MD Dr. Quintin Alto, informed [facility] CSW of case closure, mailed case closure letter for nursing only to patient's home.  THN CM Care Plan Problem One        Most Recent Value   Care Plan Problem One  Difficulty managing blood pressure aeb elevated blood pressure   Role Documenting the Problem One  Care Management Coordinator   Care Plan for Problem One  Active   THN Long Term Goal (31-90 days)  Patient will get BP down by 10 points systolic and diastolic over the next 32-91 days.    THN Long Term Goal Start Date  04/09/15   THN Long Term Goal Met Date  06/14/15 [Blood pressure has improved- goal met]   Interventions for Problem One Long Term Goal  RN CM reinterated things that can elevate blood pressure such as high sodium intake, stress and importance of making healthy choices daily. Reviewed that pain can elevate blood pressure and importance of adequatae pain control.   THN CM Short Term Goal #1 (0-30 days)  Patient will report elevated BP readings to MD and schedule appt's as warranted over the next 30 days.    THN CM Short Term Goal #1 Start Date  05/17/15 [goal restarted- pt has not seen primary MD, will see in May]   [facility] CM Short Term Goal #1 Met Date  06/14/15 [pt saw primary MD]   Interventions for Short Term Goal #1  RN CM reviewed with pt the importance of seeing primary MD regularly   THN CM Short Term Goal #2 (0-30 days)  Pt will continue  checking blood pressure several times per week and keep log in [facility] calendar.   THN CM Short Term Goal #2 Start Date  05/17/15 Barrie Folk restarted- pt not logging]   THN CM Short Term Goal #2 Met Date  06/14/15 Barrie Folk met- pt does not want to record blood pressure]   Interventions for Short Term Goal #2  RN CM reviewed importance of continuing to checking blood pressure at least several times weekly or daily if pt can.      Plan: discharge today  Jacqlyn Larsen [facility],  BSN [place] Coordinator [phone]

## 2015-06-22 ENCOUNTER — Encounter: Payer: Self-pay | Admitting: Licensed Clinical Social Worker

## 2015-06-22 ENCOUNTER — Other Ambulatory Visit: Payer: Self-pay | Admitting: Licensed Clinical Social Worker

## 2015-06-22 NOTE — Patient Outreach (Signed)
**Note De-Identified Ashley Obfuscation** Assessment:  CSW called client phone number on 06/22/15 but was unable to reach client Ashley phone. CSW called spouse of client on 06/22/15 but was unable to reach spouse of client Ashley phone. CSW did call Ashley [name], son of client, on 06/22/15 and was able to speak with Ashley [name] about needs of client. CSW informed Ashley [name] that RN Jacqlyn Larsen had recently discharged client from [facility].  CSW was informed by RN Jacqlyn Larsen recently that Ashley [name] had told RN that Ashley [name] felt that her social work needs were met at this time.  Ashley [name] had told RN Jacqlyn Larsen recently that Ashley [name] had no further CSW needs to address at present.  CSW shared this information with Ashley [name], son of client, on 06/22/15. CSW informed Ashley [name] on 06/22/15  that CSW was discharging Ashley [name] on 06/22/15 from [facility] services due to client request and also because client has met her care plan goals with CSW services. CSW asked Ashley [name] to please share this information with Ashley [name] on 06/22/15. Ashley [name] informed CSW on 06/22/15 that he would share information above with Ashley [name] on 06/22/15. CSW thanked Ashley [name] for phone call with CSW on 06/22/15.    Plan:  CSW is discharging Ashley [name] from [facility] services on 06/22/15 since client has met care plan goals with CSW services. CSW to inform Ashley [name] that [place] discharged client from [facility] CSW services on 06/22/15. CSW to fax physician case closure letter to Ashley [name] informing Ashley [name] that Ashley [name] discharged client from [facility] CSW services on 06/22/15.  Ashley [name]. MSW, LCSW Licensed Clinical Social Worker [facility] Care Management [phone]

## 2015-06-27 ENCOUNTER — Other Ambulatory Visit: Payer: Self-pay

## 2015-06-27 ENCOUNTER — Encounter: Payer: Self-pay | Admitting: Nurse Practitioner

## 2015-06-27 ENCOUNTER — Ambulatory Visit (INDEPENDENT_AMBULATORY_CARE_PROVIDER_SITE_OTHER): Payer: Medicare Other | Admitting: Nurse Practitioner

## 2015-06-27 VITALS — BP 123/87 | HR 73 | Temp 98.6°F | Ht 64.0 in | Wt 183.2 lb

## 2015-06-27 DIAGNOSIS — Z1211 Encounter for screening for malignant neoplasm of colon: Secondary | ICD-10-CM

## 2015-06-27 DIAGNOSIS — K589 Irritable bowel syndrome without diarrhea: Secondary | ICD-10-CM | POA: Insufficient documentation

## 2015-06-27 DIAGNOSIS — K219 Gastro-esophageal reflux disease without esophagitis: Secondary | ICD-10-CM | POA: Insufficient documentation

## 2015-06-27 DIAGNOSIS — K649 Unspecified hemorrhoids: Secondary | ICD-10-CM

## 2015-06-27 MED ORDER — PANTOPRAZOLE SODIUM 40 MG PO TBEC: 40.0000 mg | DELAYED_RELEASE_TABLET | Freq: Every day | ORAL | Status: DC

## 2015-06-27 MED ORDER — PEG 3350-KCL-NA BICARB-NACL 420 G PO SOLR: 4000.0000 mL | Freq: Once | ORAL | Status: DC

## 2015-06-27 MED ORDER — HYDROCORTISONE 2.5 % RE CREA: 1.0000 "application " | TOPICAL_CREAM | Freq: Two times a day (BID) | RECTAL | Status: DC

## 2015-06-27 NOTE — Patient Instructions (Addendum)
1. Take Protonix daily, 30 minutes before your first meal of the day. 2. We will schedule your procedure for you. 3. Take Linzess 290 g once a day. Take this on an empty stomach. As we discussed, you may have some diarrhea which should resolve on its and within 1-2 weeks. 4. We will provide Linzess samples to last 2 weeks. 5. Call with a progress report in 2 weeks to let us know how it is working. 6. Return for follow-up in 3 months.

## 2015-06-27 NOTE — Assessment & Plan Note (Signed)
Issue with hemorrhoids that flare and cause discomfort. No rectal bleeding. Has tried over-the-counter medications with minimal relief. At this point I will send an Anusol rectal cream to the pharmacy to take twice a day for flares for a maximum of 7 days at a time. Discussed allergy to prednisone which she states is only with the oral route, she has had topical hydrocortisone before without issue. She is due for colonoscopy as well as noted below and this will allow assessment of her hemorrhoids for future planning a possible banding versus need for surgical excision should topical treatments not be effective enough for her. Return for follow-up in 3 months.

## 2015-06-27 NOTE — Assessment & Plan Note (Signed)
History and presentation consistent with irritable bowel syndrome constipation type with overflow diarrhea. At this point I will start her on Linzess 290 g once a day on an empty stomach. We will provide samples for 2 weeks to try. Discussed possible adverse effects including diarrhea. She is to call us in 2 weeks with a progress report related to medication. Return for follow-up in 3 months.

## 2015-06-27 NOTE — Assessment & Plan Note (Signed)
50 year old female currently due for screening colonoscopy. She's had a colonoscopy previously, over 10 years ago, related to GI symptoms. She is generally asymptomatic from a GI standpoint currently. We will proceed with screening colonoscopy as indicated.  Proceed with TCS with propofol with Dr. Gala Romney in near future: the risks, benefits, and alternatives have been discussed with the patient in detail. The patient states understanding and desires to proceed.  The patient is currently on Xanax, Flexeril, Neurontin, Ativan, Phenergan, Ultram. No anticoagulants. At this point we'll proceed with the procedure on propofol to promote adequate sedation.

## 2015-06-27 NOTE — Assessment & Plan Note (Signed)
Persistent GERD symptoms, previously well controlled on Protonix which she has not had for over a year due to becoming disabled and waiting for her disability insurance to go through. Hasn't taking Tums without relief. This point I will send Protonix 40 mg once a day and her pharmacy. Return for follow-up in 3 months for further evaluation.

## 2015-06-27 NOTE — Progress Notes (Signed)
cc'ed to pcp °
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
## 2015-07-06 NOTE — Patient Instructions (Addendum)
Ashley [name]  07/06/2015     @PREFPERIOPPHARMACY @   Your procedure is scheduled on 07/16/2015.  Report to [name] at 9:45 A.M.  Call this number if you have problems the morning of surgery:  [phone]   Remember:  Do not eat food or drink liquids after midnight.  Take these medicines the morning of surgery with A SIP OF WATER : XANAX, NEURONTIN, ATIVAN, PROTONIX, PROPRANOLOL, ULTRAM AND EFFEXOR   Do not wear jewelry, make-up or nail polish.  Do not wear lotions, powders, or perfumes.  You may wear deodorant.  Do not shave 48 hours prior to surgery.  Men may shave face and neck.  Do not bring valuables to the hospital.  [facility] is not responsible for any belongings or valuables.  Contacts, dentures or bridgework may not be worn into surgery.  Leave your suitcase in the car.  After surgery it may be brought to your room.  For patients admitted to the hospital, discharge time will be determined by your treatment team.  Patients discharged the day of surgery will not be allowed to drive home.   Name and phone number of your driver:   FAMILY Special instructions:  NONE  Please read over the following fact sheets that you were given. Care and Recovery After Surgery    Colonoscopy A colonoscopy is an exam to look at the entire large intestine (colon). This exam can help find problems such as tumors, polyps, inflammation, and areas of bleeding. The exam takes about 1 hour.  LET [facility] CARE PROVIDER KNOW ABOUT:   Any allergies you have.  All medicines you are taking, including vitamins, herbs, eye drops, creams, and over-the-counter medicines.  Previous problems you or members of your family have had with the use of anesthetics.  Any blood disorders you have.  Previous surgeries you have had.  Medical conditions you have. RISKS AND COMPLICATIONS  Generally, this is a safe procedure. However, as with any procedure, complications can occur. Possible  complications include:  Bleeding.  Tearing or rupture of the colon wall.  Reaction to medicines given during the exam.  Infection (rare). BEFORE THE PROCEDURE   Ask your health care provider about changing or stopping your regular medicines.  You may be prescribed an oral bowel prep. This involves drinking a large amount of medicated liquid, starting the day before your procedure. The liquid will cause you to have multiple loose stools until your stool is almost clear or light green. This cleans out your colon in preparation for the procedure.  Do not eat or drink anything else once you have started the bowel prep, unless your health care provider tells you it is safe to do so.  Arrange for someone to drive you home after the procedure. PROCEDURE   You will be given medicine to help you relax (sedative).  You will lie on your side with your knees bent.  A long, flexible tube with a light and camera on the end (colonoscope) will be inserted through the rectum and into the colon. The camera sends video back to a computer screen as it moves through the colon. The colonoscope also releases carbon dioxide gas to inflate the colon. This helps your health care provider see the area better.  During the exam, your health care provider may take a small tissue sample (biopsy) to be examined under a microscope if any abnormalities are found.  The exam is finished when the entire colon has been viewed.  AFTER THE PROCEDURE   Do not drive for 24 hours after the exam.  You may have a small amount of blood in your stool.  You may pass moderate amounts of gas and have mild abdominal cramping or bloating. This is caused by the gas used to inflate your colon during the exam.  Ask when your test results will be ready and how you will get your results. Make sure you get your test results.   This information is not intended to replace advice given to you by your health care provider. Make sure you  discuss any questions you have with your health care provider.   Document Released: 01/11/2000 Document Revised: 11/03/2012 Document Reviewed: 09/20/2012 Elsevier Interactive Patient Education 2016 [place] Anesthesia, Adult General anesthesia is a sleep-like state of non-feeling produced by medicines (anesthetics). General anesthesia prevents you from being alert and feeling pain during a medical procedure. Your caregiver may recommend general anesthesia if your procedure: Is long. Is painful or uncomfortable. Would be frightening to see or hear. Requires you to be still. Affects your breathing. Causes significant blood loss. LET YOUR CAREGIVER KNOW ABOUT: Allergies to food or medicine. Medicines taken, including vitamins, herbs, eyedrops, over-the-counter medicines, and creams. Use of steroids (by mouth or creams). Previous problems with anesthetics or numbing medicines, including problems experienced by relatives. History of bleeding problems or blood clots. Previous surgeries and types of anesthetics received. Possibility of pregnancy, if this applies. Use of cigarettes, alcohol, or illegal drugs. Any health condition(s), especially diabetes, sleep apnea, and high blood pressure. RISKS AND COMPLICATIONS General anesthesia rarely causes complications. However, if complications do occur, they can be life threatening. Complications include: A lung infection. A stroke. A heart attack. Waking up during the procedure. When this occurs, the patient may be unable to move and communicate that he or she is awake. The patient may feel severe pain. Older adults and adults with serious medical problems are more likely to have complications than adults who are young and healthy. Some complications can be prevented by answering all of your caregiver's questions thoroughly and by following all pre-procedure instructions. It is important to tell your caregiver if any of the  pre-procedure instructions, especially those related to diet, were not followed. Any food or liquid in the stomach can cause problems when you are under general anesthesia. BEFORE THE PROCEDURE Ask your caregiver if you will have to spend the night at the hospital. If you will not have to spend the night, arrange to have an adult drive you and stay with you for 24 hours. Follow your caregiver's instructions if you are taking dietary supplements or medicines. Your caregiver may tell you to stop taking them or to reduce your dosage. Do not smoke for as long as possible before your procedure. If possible, stop smoking 3-6 weeks before the procedure. Do not take new dietary supplements or medicines within 1 week of your procedure unless your caregiver approves them. Do not eat within 8 hours of your procedure or as directed by your caregiver. Drink only clear liquids, such as water, black coffee (without milk or cream), and fruit juices (without pulp). Do not drink within 3 hours of your procedure or as directed by your caregiver. You may brush your teeth on the morning of the procedure, but make sure to spit out the toothpaste and water when finished. PROCEDURE  You will receive anesthetics through a mask, through an intravenous (IV) access tube,  or through both. A doctor who specializes in anesthesia (anesthesiologist) or a nurse who specializes in anesthesia (nurse anesthetist) or both will stay with you throughout the procedure to make sure you remain unconscious. He or she will also watch your blood pressure, pulse, and oxygen levels to make sure that the anesthetics do not cause any problems. Once you are asleep, a breathing tube or mask may be used to help you breathe. AFTER THE PROCEDURE You will wake up after the procedure is complete. You may be in the room where the procedure was performed or in a recovery area. You may have a sore throat if a breathing tube was used. You may also  feel: Dizzy. Weak. Drowsy. Confused. Nauseous. Cold. These are all normal responses and can be expected to last for up to 24 hours after the procedure is complete. A caregiver will tell you when you are ready to go home. This will usually be when you are fully awake and in stable condition.   This information is not intended to replace advice given to you by your health care provider. Make sure you discuss any questions you have with your health care provider.   Document Released: 04/22/2007 Document Revised: 02/03/2014 Document Reviewed: 05/14/2011 Elsevier Interactive Patient Education [employer].

## 2015-07-10 ENCOUNTER — Other Ambulatory Visit: Payer: Self-pay

## 2015-07-10 ENCOUNTER — Encounter (HOSPITAL_COMMUNITY)
Admission: RE | Admit: 2015-07-10 | Discharge: 2015-07-10 | Disposition: A | Discharge status: Home / Self Care | Payer: Medicare Other | Source: Ambulatory Visit | Attending: Internal Medicine | Admitting: Internal Medicine

## 2015-07-10 ENCOUNTER — Encounter (HOSPITAL_COMMUNITY): Payer: Self-pay

## 2015-07-10 DIAGNOSIS — Z801 Family history of malignant neoplasm of trachea, bronchus and lung: Secondary | ICD-10-CM | POA: Insufficient documentation

## 2015-07-10 DIAGNOSIS — Z01810 Encounter for preprocedural cardiovascular examination: Secondary | ICD-10-CM | POA: Insufficient documentation

## 2015-07-10 DIAGNOSIS — Z90710 Acquired absence of both cervix and uterus: Secondary | ICD-10-CM | POA: Diagnosis not present

## 2015-07-10 DIAGNOSIS — K219 Gastro-esophageal reflux disease without esophagitis: Secondary | ICD-10-CM | POA: Diagnosis not present

## 2015-07-10 DIAGNOSIS — Z79899 Other long term (current) drug therapy: Secondary | ICD-10-CM | POA: Insufficient documentation

## 2015-07-10 DIAGNOSIS — I10 Essential (primary) hypertension: Secondary | ICD-10-CM | POA: Diagnosis not present

## 2015-07-10 DIAGNOSIS — Z8249 Family history of ischemic heart disease and other diseases of the circulatory system: Secondary | ICD-10-CM | POA: Diagnosis not present

## 2015-07-10 DIAGNOSIS — Z9889 Other specified postprocedural states: Secondary | ICD-10-CM | POA: Diagnosis not present

## 2015-07-10 DIAGNOSIS — Z9049 Acquired absence of other specified parts of digestive tract: Secondary | ICD-10-CM | POA: Insufficient documentation

## 2015-07-10 DIAGNOSIS — Z01812 Encounter for preprocedural laboratory examination: Secondary | ICD-10-CM | POA: Insufficient documentation

## 2015-07-10 LAB — BASIC METABOLIC PANEL
Anion gap: 5 (ref 5–15)
BUN: 13 mg/dL (ref 6–20)
CHLORIDE: 112 mmol/L — AB (ref 101–111)
CO2: 22 mmol/L (ref 22–32)
CREATININE: 0.73 mg/dL (ref 0.44–1.00)
Calcium: 8.8 mg/dL — ABNORMAL LOW (ref 8.9–10.3)
GFR calc non Af Amer: >60 mL/min (ref >60)
GLUCOSE: 105 mg/dL — AB (ref 65–99)
Potassium: 4.1 mmol/L (ref 3.5–5.1)
Sodium: 139 mmol/L (ref 135–145)

## 2015-07-10 LAB — CBC WITH DIFFERENTIAL/PLATELET
Basophils Absolute: 0 10*3/uL (ref 0.0–0.1)
Basophils Relative: 0 %
Eosinophils Absolute: 0.1 10*3/uL (ref 0.0–0.7)
Eosinophils Relative: 1 %
HEMATOCRIT: 39.9 % (ref 36.0–46.0)
HEMOGLOBIN: 13.2 g/dL (ref 12.0–15.0)
LYMPHS ABS: 1.5 10*3/uL (ref 0.7–4.0)
Lymphocytes Relative: 24 %
MCH: 29.9 pg (ref 26.0–34.0)
MCHC: 33.1 g/dL (ref 30.0–36.0)
MCV: 90.5 fL (ref 78.0–100.0)
MONOS PCT: 5 %
Monocytes Absolute: 0.3 10*3/uL (ref 0.1–1.0)
NEUTROS ABS: 4.6 10*3/uL (ref 1.7–7.7)
NEUTROS PCT: 70 %
Platelets: 283 10*3/uL (ref 150–400)
RBC: 4.41 MIL/uL (ref 3.87–5.11)
RDW: 12.1 % (ref 11.5–15.5)
WBC: 6.5 10*3/uL (ref 4.0–10.5)

## 2015-07-16 ENCOUNTER — Ambulatory Visit (HOSPITAL_COMMUNITY)
Admission: RE | Admit: 2015-07-16 | Discharge: 2015-07-16 | Disposition: A | Discharge status: Home Health | Payer: Medicare Other | Source: Ambulatory Visit | Attending: Internal Medicine | Admitting: Internal Medicine

## 2015-07-16 ENCOUNTER — Ambulatory Visit (HOSPITAL_COMMUNITY): Payer: Medicare Other | Admitting: Anesthesiology

## 2015-07-16 ENCOUNTER — Encounter (HOSPITAL_COMMUNITY): Payer: Self-pay | Admitting: *Deleted

## 2015-07-16 ENCOUNTER — Encounter (HOSPITAL_COMMUNITY)
Admission: RE | Disposition: A | Discharge status: Home Health | Payer: Self-pay | Source: Ambulatory Visit | Attending: Internal Medicine

## 2015-07-16 DIAGNOSIS — Z1211 Encounter for screening for malignant neoplasm of colon: Secondary | ICD-10-CM | POA: Insufficient documentation

## 2015-07-16 DIAGNOSIS — Z8719 Personal history of other diseases of the digestive system: Secondary | ICD-10-CM | POA: Diagnosis not present

## 2015-07-16 DIAGNOSIS — K5730 Diverticulosis of large intestine without perforation or abscess without bleeding: Secondary | ICD-10-CM | POA: Insufficient documentation

## 2015-07-16 DIAGNOSIS — Z79899 Other long term (current) drug therapy: Secondary | ICD-10-CM | POA: Insufficient documentation

## 2015-07-16 DIAGNOSIS — G43909 Migraine, unspecified, not intractable, without status migrainosus: Secondary | ICD-10-CM | POA: Insufficient documentation

## 2015-07-16 DIAGNOSIS — Z6831 Body mass index (BMI) 31.0-31.9, adult: Secondary | ICD-10-CM | POA: Insufficient documentation

## 2015-07-16 DIAGNOSIS — M1990 Unspecified osteoarthritis, unspecified site: Secondary | ICD-10-CM | POA: Diagnosis not present

## 2015-07-16 DIAGNOSIS — F4310 Post-traumatic stress disorder, unspecified: Secondary | ICD-10-CM | POA: Insufficient documentation

## 2015-07-16 DIAGNOSIS — D122 Benign neoplasm of ascending colon: Secondary | ICD-10-CM

## 2015-07-16 DIAGNOSIS — Z882 Allergy status to sulfonamides status: Secondary | ICD-10-CM | POA: Insufficient documentation

## 2015-07-16 DIAGNOSIS — K219 Gastro-esophageal reflux disease without esophagitis: Secondary | ICD-10-CM | POA: Insufficient documentation

## 2015-07-16 DIAGNOSIS — M797 Fibromyalgia: Secondary | ICD-10-CM | POA: Insufficient documentation

## 2015-07-16 DIAGNOSIS — Z91030 Bee allergy status: Secondary | ICD-10-CM | POA: Diagnosis not present

## 2015-07-16 DIAGNOSIS — F329 Major depressive disorder, single episode, unspecified: Secondary | ICD-10-CM | POA: Diagnosis not present

## 2015-07-16 DIAGNOSIS — Z888 Allergy status to other drugs, medicaments and biological substances status: Secondary | ICD-10-CM | POA: Insufficient documentation

## 2015-07-16 DIAGNOSIS — G8929 Other chronic pain: Secondary | ICD-10-CM | POA: Insufficient documentation

## 2015-07-16 DIAGNOSIS — E669 Obesity, unspecified: Secondary | ICD-10-CM | POA: Insufficient documentation

## 2015-07-16 DIAGNOSIS — I10 Essential (primary) hypertension: Secondary | ICD-10-CM | POA: Insufficient documentation

## 2015-07-16 DIAGNOSIS — Z885 Allergy status to narcotic agent status: Secondary | ICD-10-CM | POA: Diagnosis not present

## 2015-07-16 DIAGNOSIS — Z860100 Personal history of colon polyps, unspecified: Secondary | ICD-10-CM | POA: Insufficient documentation

## 2015-07-16 HISTORY — PX: COLONOSCOPY WITH PROPOFOL: SHX5780

## 2015-07-16 HISTORY — PX: POLYPECTOMY: SHX5525

## 2015-07-16 SURGERY — COLONOSCOPY WITH PROPOFOL
Anesthesia: Monitor Anesthesia Care

## 2015-07-16 MED ORDER — LACTATED RINGERS IV SOLN
INTRAVENOUS | Status: DC
  Administered 2015-07-16: 10:00:00 via INTRAVENOUS

## 2015-07-16 MED ORDER — FENTANYL CITRATE (PF) 100 MCG/2ML IJ SOLN
25.0000 ug | INTRAMUSCULAR | Status: AC
  Administered 2015-07-16 (×2): 25 ug via INTRAVENOUS

## 2015-07-16 MED ORDER — ONDANSETRON HCL 4 MG/2ML IJ SOLN
4.0000 mg | Freq: Once | INTRAMUSCULAR | Status: AC
  Administered 2015-07-16: 4 mg via INTRAVENOUS

## 2015-07-16 MED ORDER — PROPOFOL 500 MG/50ML IV EMUL
INTRAVENOUS | Status: DC | PRN
  Administered 2015-07-16: 150 ug/kg/min via INTRAVENOUS

## 2015-07-16 MED ORDER — MIDAZOLAM HCL 2 MG/2ML IJ SOLN
1.0000 mg | INTRAMUSCULAR | Status: DC | PRN
  Administered 2015-07-16: 2 mg via INTRAVENOUS

## 2015-07-16 MED ORDER — GLYCOPYRROLATE 0.2 MG/ML IJ SOLN
INTRAMUSCULAR | Status: AC
  Filled 2015-07-16: qty 1

## 2015-07-16 MED ORDER — GLYCOPYRROLATE 0.2 MG/ML IJ SOLN
0.2000 mg | Freq: Once | INTRAMUSCULAR | Status: AC
  Administered 2015-07-16: 0.2 mg via INTRAVENOUS

## 2015-07-16 MED ORDER — FENTANYL CITRATE (PF) 100 MCG/2ML IJ SOLN
INTRAMUSCULAR | Status: AC
  Filled 2015-07-16: qty 2

## 2015-07-16 MED ORDER — MIDAZOLAM HCL 2 MG/2ML IJ SOLN
INTRAMUSCULAR | Status: AC
  Filled 2015-07-16: qty 2

## 2015-07-16 MED ORDER — ONDANSETRON HCL 4 MG/2ML IJ SOLN
INTRAMUSCULAR | Status: AC
  Filled 2015-07-16: qty 2

## 2015-07-16 MED ORDER — FENTANYL CITRATE (PF) 100 MCG/2ML IJ SOLN: 25.0000 ug | INTRAMUSCULAR | Status: DC | PRN

## 2015-07-16 MED ORDER — ONDANSETRON HCL 4 MG/2ML IJ SOLN: 4.0000 mg | Freq: Once | INTRAMUSCULAR | Status: DC | PRN

## 2015-07-16 NOTE — Progress Notes (Signed)
Eyeglasses returned to pt. Coke given to drink. Tolerated well.

## 2015-07-16 NOTE — Interval H&P Note (Signed)
History and Physical Interval Note:  07/16/2015 10:34 AM  Ashley [name]  has presented today for surgery, with the diagnosis of screening  The various methods of treatment have been discussed with the patient and family. After consideration of risks, benefits and other options for treatment, the patient has consented to  Procedure(s) with comments: COLONOSCOPY WITH PROPOFOL (N/A) - 1115 as a surgical intervention .  The patient's history has been reviewed, patient examined, no change in status, stable for surgery.  I have reviewed the patient's chart and labs.  Questions were answered to the patient's satisfaction.        No change. Screening colonoscopy per plan.  The risks, benefits, limitations, alternatives and imponderables have been reviewed with the patient. Questions have been answered. All parties are agreeable.

## 2015-07-16 NOTE — Transfer of Care (Signed)
Immediate Anesthesia Transfer of Care Note  Patient: Ashley [name]  Procedure(s) Performed: Procedure(s) with comments: COLONOSCOPY WITH PROPOFOL (N/A) - 1115 POLYPECTOMY - ascending colon polyp  Patient Location: PACU  Anesthesia Type:MAC  Level of Consciousness: awake, oriented and patient cooperative  Airway & Oxygen Therapy: Patient Spontanous Breathing and Patient connected to face mask oxygen  Post-op Assessment: Report given to RN and Post -op Vital signs reviewed and stable  Post vital signs: Reviewed and stable  Last Vitals:  Filed Vitals:   07/16/15 1035 07/16/15 1040  BP: 133/87 116/78  Pulse:    Temp:    Resp: 22 16    Last Pain:  Filed Vitals:   07/16/15 1042  PainSc: 1       Patients Stated Pain Goal: 7 ([id number])  Complications: No apparent anesthesia complications

## 2015-07-16 NOTE — Anesthesia Postprocedure Evaluation (Signed)
Anesthesia Post Note  Patient: Ashley [name]  Procedure(s) Performed: Procedure(s) (LRB): COLONOSCOPY WITH PROPOFOL (N/A) POLYPECTOMY  Patient location during evaluation: PACU Anesthesia Type: MAC Level of consciousness: awake and oriented Pain management: pain level controlled Vital Signs Assessment: post-procedure vital signs reviewed and stable Respiratory status: spontaneous breathing, respiratory function stable and patient connected to face mask oxygen Cardiovascular status: stable Postop Assessment: no signs of nausea or vomiting Anesthetic complications: no    Last Vitals:  Filed Vitals:   07/16/15 1035 07/16/15 1040  BP: 133/87 116/78  Pulse:    Temp:    Resp: 22 16    Last Pain:  Filed Vitals:   07/16/15 1042  PainSc: 1                  ADAMS, AMY A
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
## 2015-07-16 NOTE — Op Note (Addendum)
[facility] Patient Name: Ashley [name] Procedure Date: 07/16/2015 10:48 AM MRN: [id number] Date of Birth: 1965/04/27 Attending MD: Norvel Richards , MD CSN: [id number] Age: 50 Admit Type: Outpatient Procedure:                Colonoscopy with snare polypectomy. Indications:              Screening for colorectal malignant neoplasm Providers:                Norvel Richards, MD, Otis Peak B. Sharon Seller, RN,                            Georgeann Oppenheim, Technician Referring MD:              Medicines:                Propofol per Anesthesia Complications:            No immediate complications. Estimated Blood Loss:     Estimated blood loss was minimal. Procedure:                Pre-Anesthesia Assessment:                           - Prior to the procedure, a History and Physical                            was performed, and patient medications and                            allergies were reviewed. The patient's tolerance of                            previous anesthesia was also reviewed. The risks                            and benefits of the procedure and the sedation                            options and risks were discussed with the patient.                            All questions were answered, and informed consent                            was obtained. Prior Anticoagulants: The patient has                            taken no previous anticoagulant or antiplatelet                            agents. ASA Grade Assessment: II - A patient with                            mild systemic disease. After reviewing the risks  and benefits, the patient was deemed in                            satisfactory condition to undergo the procedure.                           After obtaining informed consent, the colonoscope                            was passed under direct vision. Throughout the                            procedure, the patient's blood pressure, pulse,  and                            oxygen saturations were monitored continuously. The                            [id number] scope was introduced through                            the anus and advanced to the the cecum, identified                            by appendiceal orifice and ileocecal valve. The                            colonoscopy was performed without difficulty. The                            patient tolerated the procedure well. The quality                            of the bowel preparation was adequate. The                            ileocecal valve, appendiceal orifice, and rectum                            were photographed. Scope In: 10:52:57 AM Scope Out: [id number] AM Scope Withdrawal Time: 0 hours 7 minutes 41 seconds  Total Procedure Duration: 0 hours 13 minutes 10 seconds  Findings:      The perianal and digital rectal examinations were normal.      A 4 mm polyp was found in the ascending colon. The polyp was sessile.       The polyp was removed with a cold snare. Resection and retrieval were       complete. Estimated blood loss was minimal.      A few medium-mouthed diverticula were found in the left colon and right       colon.      The exam was otherwise without abnormality on direct and retroflexion       views. There were technical issues with the image quality. There was no       illumination problem making the mucosal surface  is difficult to see. Impression:               - One 4 mm polyp in the ascending colon, removed                            with a cold snare. Resected and retrieved.                           - Diverticulosis in the left colon and in the right                            colon.                           - The examination was otherwise normal on direct                            and retroflexion views. Moderate Sedation:      Moderate (conscious) sedation was personally administered by an       anesthesia professional. The  following parameters were monitored: oxygen       saturation, heart rate, blood pressure, respiratory rate, EKG, adequacy       of pulmonary ventilation, and response to care. Total physician       intraservice time was 15 minutes. Recommendation:           - Patient has a contact number available for                            emergencies. The signs and symptoms of potential                            delayed complications were discussed with the                            patient. Return to normal activities tomorrow.                            Written discharge instructions were provided to the                            patient.                           - Advance diet as tolerated.                           - Continue present medications.                           - Await pathology results.                           - Repeat colonoscopy date to be determined after                            pending pathology results are reviewed  for                            surveillance based on pathology results.                           - Return to GI office in 8 weeks. Patient notes                            Protonix inadequate in controlling reflux symptoms.                            Will have her go by the office and try three-week                            supply - Dexilant 60 mg daily Procedure Code(s):        --- Professional ---                           [phone], Colonoscopy, flexible; with removal of                            tumor(s), polyp(s), or other lesion(s) by snare                            technique Diagnosis Code(s):        --- Professional ---                           Z12.11, Encounter for screening for malignant                            neoplasm of colon                           D12.2, Benign neoplasm of ascending colon                           K57.30, Diverticulosis of large intestine without                            perforation or abscess without bleeding CPT  copyright 2016 American Medical Association. All rights reserved. The codes documented in this report are preliminary and upon coder review may  be revised to meet current compliance requirements. [name] , MD Norvel Richards, MD 07/16/2015 11:14:59 AM This report has been signed electronically. Number of Addenda: 0

## 2015-07-16 NOTE — Anesthesia Procedure Notes (Signed)
Procedure Name: MAC Date/Time: 07/16/2015 10:46 AM Performed by: Andree Elk, AMY A Pre-anesthesia Checklist: Emergency Drugs available, Patient identified, Suction available, Patient being monitored and Timeout performed Patient Re-evaluated:Patient Re-evaluated prior to inductionOxygen Delivery Method: Simple face mask

## 2015-07-16 NOTE — Discharge Instructions (Signed)
Diverticulosis and colon polyp  information provided  Stop Protonix; begin a trial of Dexilant  60 mg daily-may go my my office for 3 week supply of samples.  Office visit with [place] in 8 weeks. Follow up August 30 at 10 am with [facility]  Further recommendations to follow pending review of pathology report   Colonoscopy Discharge Instructions  Read the instructions outlined below and refer to this sheet in the next few weeks. These discharge instructions provide you with general information on caring for yourself after you leave the hospital. Your doctor may also give you specific instructions. While your treatment has been planned according to the most current medical practices available, unavoidable complications occasionally occur. If you have any problems or questions after discharge, call Dr. Gala Romney at [phone]. ACTIVITY  You may resume your regular activity, but move at a slower pace for the next 24 hours.   Take frequent rest periods for the next 24 hours.   Walking will help get rid of the air and reduce the bloated feeling in your belly (abdomen).   No driving for 24 hours (because of the medicine (anesthesia) used during the test).    Do not sign any important legal documents or operate any machinery for 24 hours (because of the anesthesia used during the test).  NUTRITION  Drink plenty of fluids.   You may resume your normal diet as instructed by your doctor.   Begin with a light meal and progress to your normal diet. Heavy or fried foods are harder to digest and may make you feel sick to your stomach (nauseated).   Avoid alcoholic beverages for 24 hours or as instructed.  MEDICATIONS  You may resume your normal medications unless your doctor tells you otherwise.  WHAT YOU CAN EXPECT TODAY  Some feelings of bloating in the abdomen.   Passage of more gas than usual.   Spotting of blood in your stool or on the toilet paper.  IF YOU HAD POLYPS REMOVED DURING THE  COLONOSCOPY:  No aspirin products for 7 days or as instructed.   No alcohol for 7 days or as instructed.   Eat a soft diet for the next 24 hours.  FINDING OUT THE RESULTS OF YOUR TEST Not all test results are available during your visit. If your test results are not back during the visit, make an appointment with your caregiver to find out the results. Do not assume everything is normal if you have not heard from your caregiver or the medical facility. It is important for you to follow up on all of your test results.  SEEK IMMEDIATE MEDICAL ATTENTION IF:  You have more than a spotting of blood in your stool.   Your belly is swollen (abdominal distention).   You are nauseated or vomiting.   You have a temperature over 101.   You have abdominal pain or discomfort that is severe or gets worse throughout the day.   Dexlansoprazole capsules What is this medicine? DEXLANSOPRAZOLE (dex lan SOE pra zole) prevents the production of acid in the stomach. It is used to treat gastroesophageal reflux disease (GERD) and inflammation of the esophagus. This medicine may be used for other purposes; ask your health care provider or pharmacist if you have questions. What should I tell my health care provider before I take this medicine? They need to know if you have any of these conditions: -liver disease -low levels of magnesium in the blood -an unusual or allergic reaction to  dexlansoprazole, other medicines, foods, dyes, or preservatives -pregnant or trying to get pregnant -breast-feeding How should I use this medicine? Take this medicine by mouth. Swallow the capsules whole with a drink of water. Follow the directions on the prescription label. Do not crush or chew. Take your medicine at regular intervals. Do not take more often than directed. If you have difficulty swallowing the capsules, you may open the capsule and sprinkle the contents on a tablespoon of applesauce. Do not crush the contents  of the capsule into the food. Swallow the dose immediately after preparing it. Do not chew. Follow with a drink of water. A special MedGuide will be given to you before each treatment. Be sure to read this information carefully each time. Talk to your pediatrician regarding the use of this medicine in children. While this drug may be prescribed for children as young as 12 years for selected conditions, precautions do apply. Overdosage: If you think you have taken too much of this medicine contact a poison control center or emergency room at once. NOTE: This medicine is only for you. Do not share this medicine with others. What if I miss a dose? If you miss a dose, take it as soon as you can. If it is almost time for your next dose, take only that dose. Do not take double or extra doses. What may interact with this medicine? Do not take this medicine with any of the following medications: -nelfinavir -rilpivirine -St. John's Wort This medicine may also interact with the following medications: -certain antiviral medicines for HIV or AIDS like atazanavir, saquinavir, ritonavir -certain medicines for fungal infections like ketoconazole, itraconazole, voriconazole -dasatinib -digoxin -erlotinib -iron salts -methotrexate -mycophenolate mofetil -nilotinib -tacrolimus -warfarin This list may not describe all possible interactions. Give your health care provider a list of all the medicines, herbs, non-prescription drugs, or dietary supplements you use. Also tell them if you smoke, drink alcohol, or use illegal drugs. Some items may interact with your medicine. What should I watch for while using this medicine? It can take several days before your stomach pain gets better. Check with your doctor or health care professional if your condition does not start to get better, or if it gets worse. You may need blood work done while you are taking this medicine. What side effects may I notice from  receiving this medicine? Side effects that you should report to your doctor or health care professional as soon as possible: -allergic reactions like skin rash, itching or hives, swelling of the face, lips, or tongue -bone, muscle or joint pain -breathing problems -chest pain or chest tightness -dark yellow or brown urine -dizziness -fast, irregular heartbeat -feeling faint or lightheaded -fever or sore throat -muscle spasm -palpitations -redness, blistering, peeling or loosening of the skin, including inside the mouth -seizures -tremors -unusual bleeding or bruising -unusually weak or tired -yellowing of the eyes or skin Side effects that usually do not require medical attention (Report these to your doctor or health care professional if they continue or are bothersome.): -constipation -diarrhea -dry mouth -headache -nausea This list may not describe all possible side effects. Call your doctor for medical advice about side effects. You may report side effects to FDA at 1-800-FDA-1088. Where should I keep my medicine? Keep out of the reach of children. Store at room temperature between 15 and 30 degrees C (59 and 86 degrees F). Protect from moisture. Throw away any unused medicine after the expiration date. NOTE: This sheet is a  summary. It may not cover all possible information. If you have questions about this medicine, talk to your doctor, pharmacist, or health care provider.    2016, Elsevier/Gold Standard. (2014-08-10 19:13:22)    Diverticulosis Diverticulosis is the condition that develops when small pouches (diverticula) form in the wall of your colon. Your colon, or large intestine, is where water is absorbed and stool is formed. The pouches form when the inside layer of your colon pushes through weak spots in the outer layers of your colon. CAUSES  No one knows exactly what causes diverticulosis. RISK FACTORS  Being older than 71. Your risk for this condition  increases with age. Diverticulosis is rare in people younger than 40 years. By age 20, almost everyone has it.  Eating a low-fiber diet.  Being frequently constipated.  Being overweight.  Not getting enough exercise.  Smoking.  Taking over-the-counter pain medicines, like aspirin and ibuprofen. SYMPTOMS  Most people with diverticulosis do not have symptoms. DIAGNOSIS  Because diverticulosis often has no symptoms, health care providers often discover the condition during an exam for other colon problems. In many cases, a health care provider will diagnose diverticulosis while using a flexible scope to examine the colon (colonoscopy). TREATMENT  If you have never developed an infection related to diverticulosis, you may not need treatment. If you have had an infection before, treatment may include:  Eating more fruits, vegetables, and grains.  Taking a fiber supplement.  Taking a live bacteria supplement (probiotic).  Taking medicine to relax your colon. HOME CARE INSTRUCTIONS   Drink at least 6-8 glasses of water each day to prevent constipation.  Try not to strain when you have a bowel movement.  Keep all follow-up appointments. If you have had an infection before:  Increase the fiber in your diet as directed by your health care provider or dietitian.  Take a dietary fiber supplement if your health care provider approves.  Only take medicines as directed by your health care provider. SEEK MEDICAL CARE IF:   You have abdominal pain.  You have bloating.  You have cramps.  You have not gone to the bathroom in 3 days. SEEK IMMEDIATE MEDICAL CARE IF:   Your pain gets worse.  Yourbloating becomes very bad.  You have a fever or chills, and your symptoms suddenly get worse.  You begin vomiting.  You have bowel movements that are bloody or black. MAKE SURE YOU:  Understand these instructions.  Will watch your condition.  Will get help right away if you are  not doing well or get worse.   This information is not intended to replace advice given to you by your health care provider. Make sure you discuss any questions you have with your health care provider.   Document Released: 10/11/2003 Document Revised: 01/18/2013 Document Reviewed: 12/08/2012 Elsevier Interactive Patient Education 2016 Elsevier Inc. Colon Polyps Polyps are lumps of extra tissue growing inside the body. Polyps can grow in the large intestine (colon). Most colon polyps are noncancerous (benign). However, some colon polyps can become cancerous over time. Polyps that are larger than a pea may be harmful. To be safe, caregivers remove and test all polyps. CAUSES  Polyps form when mutations in the genes cause your cells to grow and divide even though no more tissue is needed. RISK FACTORS There are a number of risk factors that can increase your chances of getting colon polyps. They include:  Being older than 50 years.  Family history of colon polyps or  colon cancer.  Long-term colon diseases, such as colitis or Crohn disease.  Being overweight.  Smoking.  Being inactive.  Drinking too much alcohol. SYMPTOMS  Most small polyps do not cause symptoms. If symptoms are present, they may include:  Blood in the stool. The stool may look dark red or black.  Constipation or diarrhea that lasts longer than 1 week. DIAGNOSIS People often do not know they have polyps until their caregiver finds them during a regular checkup. Your caregiver can use 4 tests to check for polyps:  Digital rectal exam. The caregiver wears gloves and feels inside the rectum. This test would find polyps only in the rectum.  Barium enema. The caregiver puts a liquid called barium into your rectum before taking X-rays of your colon. Barium makes your colon look white. Polyps are dark, so they are easy to see in the X-ray pictures.  Sigmoidoscopy. A thin, flexible tube (sigmoidoscope) is placed into your  rectum. The sigmoidoscope has a light and tiny camera in it. The caregiver uses the sigmoidoscope to look at the last third of your colon.  Colonoscopy. This test is like sigmoidoscopy, but the caregiver looks at the entire colon. This is the most common method for finding and removing polyps. TREATMENT  Any polyps will be removed during a sigmoidoscopy or colonoscopy. The polyps are then tested for cancer. PREVENTION  To help lower your risk of getting more colon polyps:  Eat plenty of fruits and vegetables. Avoid eating fatty foods.  Do not smoke.  Avoid drinking alcohol.  Exercise every day.  Lose weight if recommended by your caregiver.  Eat plenty of calcium and folate. Foods that are rich in calcium include milk, cheese, and broccoli. Foods that are rich in folate include chickpeas, kidney beans, and spinach. HOME CARE INSTRUCTIONS Keep all follow-up appointments as directed by your caregiver. You may need periodic exams to check for polyps. SEEK MEDICAL CARE IF: You notice bleeding during a bowel movement.   This information is not intended to replace advice given to you by your health care provider. Make sure you discuss any questions you have with your health care provider.   Document Released: 10/10/2003 Document Revised: 02/03/2014 Document Reviewed: 03/25/2011 Elsevier Interactive Patient Education [employer].

## 2015-07-16 NOTE — Anesthesia Preprocedure Evaluation (Signed)
Anesthesia Evaluation  Patient identified by MRN, date of birth, ID band Patient awake    Reviewed: Allergy & Precautions, H&P , NPO status , Patient's Chart, lab work & pertinent test results, reviewed documented beta blocker date and time   History of Anesthesia Complications (+) PONV and history of anesthetic complications  Airway Mallampati: III  TM Distance: >3 FB Neck ROM: Full    Dental no notable dental hx. (+) Teeth Intact, Dental Advisory Given   Pulmonary neg pulmonary ROS,    Pulmonary exam normal breath sounds clear to auscultation       Cardiovascular hypertension, Pt. on medications and Pt. on home beta blockers + dysrhythmias  Rhythm:Regular Rate:Normal     Neuro/Psych  Headaches, PSYCHIATRIC DISORDERS (PTSD ) Anxiety Depression  Neuromuscular disease ( Lumbosacral radiculopathy )    GI/Hepatic Neg liver ROS, PUD, GERD  Medicated and Controlled,  Endo/Other  negative endocrine ROS  Renal/GU negative Renal ROS  negative genitourinary   Musculoskeletal  (+) Arthritis , Osteoarthritis,  Fibromyalgia -  Abdominal   Peds  Hematology negative hematology ROS (+)   Anesthesia Other Findings   Reproductive/Obstetrics negative OB ROS                             Anesthesia Physical Anesthesia Plan  ASA: III  Anesthesia Plan: MAC   Post-op Pain Management:    Induction: Intravenous  Airway Management Planned: Simple Face Mask  Additional Equipment:   Intra-op Plan:   Post-operative Plan:   Informed Consent: I have reviewed the patients History and Physical, chart, labs and discussed the procedure including the risks, benefits and alternatives for the proposed anesthesia with the patient or authorized representative who has indicated his/her understanding and acceptance.     Plan Discussed with:   Anesthesia Plan Comments:         Anesthesia Quick Evaluation

## 2015-07-18 ENCOUNTER — Encounter: Payer: Self-pay | Admitting: Internal Medicine

## 2015-07-18 ENCOUNTER — Encounter (HOSPITAL_COMMUNITY): Payer: Self-pay | Admitting: Internal Medicine

## 2015-08-01 DIAGNOSIS — Z0289 Encounter for other administrative examinations: Secondary | ICD-10-CM

## 2015-08-09 ENCOUNTER — Telehealth: Payer: Self-pay | Admitting: Neurology

## 2015-08-09 NOTE — Telephone Encounter (Signed)
error 

## 2015-08-13 DIAGNOSIS — Z6830 Body mass index (BMI) 30.0-30.9, adult: Secondary | ICD-10-CM | POA: Diagnosis not present

## 2015-09-04 DIAGNOSIS — M545 Low back pain: Secondary | ICD-10-CM | POA: Diagnosis not present

## 2015-09-04 DIAGNOSIS — G43809 Other migraine, not intractable, without status migrainosus: Secondary | ICD-10-CM | POA: Diagnosis not present

## 2015-09-04 DIAGNOSIS — E6609 Other obesity due to excess calories: Secondary | ICD-10-CM | POA: Diagnosis not present

## 2015-09-04 DIAGNOSIS — Z1389 Encounter for screening for other disorder: Secondary | ICD-10-CM | POA: Diagnosis not present

## 2015-09-04 DIAGNOSIS — F411 Generalized anxiety disorder: Secondary | ICD-10-CM | POA: Diagnosis not present

## 2015-09-04 DIAGNOSIS — Z6831 Body mass index (BMI) 31.0-31.9, adult: Secondary | ICD-10-CM | POA: Diagnosis not present

## 2015-09-04 DIAGNOSIS — R0789 Other chest pain: Secondary | ICD-10-CM | POA: Diagnosis not present

## 2015-09-04 DIAGNOSIS — F3341 Major depressive disorder, recurrent, in partial remission: Secondary | ICD-10-CM | POA: Diagnosis not present

## 2015-09-26 ENCOUNTER — Other Ambulatory Visit: Payer: Self-pay

## 2015-09-26 ENCOUNTER — Ambulatory Visit (INDEPENDENT_AMBULATORY_CARE_PROVIDER_SITE_OTHER): Payer: Medicare Other | Admitting: Nurse Practitioner

## 2015-09-26 ENCOUNTER — Encounter: Payer: Self-pay | Admitting: Nurse Practitioner

## 2015-09-26 VITALS — BP 146/95 | HR 72 | Temp 98.5°F | Ht 64.0 in | Wt 186.4 lb

## 2015-09-26 DIAGNOSIS — K219 Gastro-esophageal reflux disease without esophagitis: Secondary | ICD-10-CM

## 2015-09-26 DIAGNOSIS — K589 Irritable bowel syndrome without diarrhea: Secondary | ICD-10-CM

## 2015-09-26 MED ORDER — SUCRALFATE 1 G PO TABS: 1.0000 g | ORAL_TABLET | Freq: Three times a day (TID) | ORAL | 1 refills | Status: DC | PRN

## 2015-09-26 NOTE — Patient Instructions (Signed)
1. We'll schedule your procedure for you. 2. I sent Carafate to your pharmacy for you. Take it at the 3 times a day as needed for worsening symptoms. 3. Return for follow-up in 3 months.
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
## 2015-09-28 NOTE — Assessment & Plan Note (Signed)
Patient with significant, persistent GERD symptoms despite Protonix. She declined to take Dexilant due to history of liver tumor and precautions found on package insert. Her tonics effective in the morning but not the evening. Rare NSAID use, denies aspirin powders. Given persistent symptoms on PPI we'll proceed with EGD. Her last EGD was about 24 years ago. She is concerned about possible ulcer developing.  Proceed with EGD on propofol/MAC with Dr. Gala Romney in near future: the risks, benefits, and alternatives have been discussed with the patient in detail. The patient states understanding and desires to proceed.  Patient is not on any anticoagulants. She is on Xanax, Flexeril, Neurontin, Ativan, Phenergan, trazodone, and Ultram. We will provide further procedure on propofol/MAC to promote adequate sedation.

## 2015-09-28 NOTE — Assessment & Plan Note (Signed)
Stop taking Linzess due to diarrhea. Symptoms are tolerable at this time and no further treatment desired. Return for follow-up as needed for worsening symptoms.

## 2015-10-02 NOTE — Progress Notes (Signed)
cc'ed to pcp °

## 2015-10-16 ENCOUNTER — Encounter (HOSPITAL_COMMUNITY): Payer: Self-pay

## 2015-10-16 ENCOUNTER — Encounter (HOSPITAL_COMMUNITY)
Admission: RE | Admit: 2015-10-16 | Discharge: 2015-10-16 | Disposition: A | Discharge status: Home / Self Care | Payer: Commercial Managed Care - HMO | Source: Ambulatory Visit | Attending: Internal Medicine | Admitting: Internal Medicine

## 2015-10-16 DIAGNOSIS — Z01812 Encounter for preprocedural laboratory examination: Secondary | ICD-10-CM | POA: Diagnosis not present

## 2015-10-16 LAB — BASIC METABOLIC PANEL
ANION GAP: 0 — AB (ref 5–15)
BUN: 14 mg/dL (ref 6–20)
CO2: 23 mmol/L (ref 22–32)
Calcium: 8.3 mg/dL — ABNORMAL LOW (ref 8.9–10.3)
Chloride: 112 mmol/L — ABNORMAL HIGH (ref 101–111)
Creatinine, Ser: 0.71 mg/dL (ref 0.44–1.00)
GFR calc Af Amer: >60 mL/min (ref >60)
GFR calc non Af Amer: >60 mL/min (ref >60)
GLUCOSE: 106 mg/dL — AB (ref 65–99)
POTASSIUM: 3.4 mmol/L — AB (ref 3.5–5.1)
Sodium: 135 mmol/L (ref 135–145)

## 2015-10-16 LAB — CBC
HEMATOCRIT: 37.7 % (ref 36.0–46.0)
Hemoglobin: 12.3 g/dL (ref 12.0–15.0)
MCH: 29 pg (ref 26.0–34.0)
MCHC: 32.6 g/dL (ref 30.0–36.0)
MCV: 88.9 fL (ref 78.0–100.0)
PLATELETS: 280 10*3/uL (ref 150–400)
RBC: 4.24 MIL/uL (ref 3.87–5.11)
RDW: 12.5 % (ref 11.5–15.5)
WBC: 6.5 10*3/uL (ref 4.0–10.5)

## 2015-10-16 NOTE — Patient Instructions (Signed)
Ashley [name]  10/16/2015     @PREFPERIOPPHARMACY @   Your procedure is scheduled on 10/22/2015.  Report to [name] at 6:15 A.M.  Call this number if you have problems the morning of surgery:  [phone]   Remember:  Do not eat food or drink liquids after midnight.  Take these medicines the morning of surgery with A SIP OF WATER Xanax, Fioracet, Flexeril, Gabapentin, Phenergan if needed, Inderal, Carafate, Topamax, Ultram, Effexor   Do not wear jewelry, make-up or nail polish.  Do not wear lotions, powders, or perfumes, or deoderant.  Do not shave 48 hours prior to surgery.  Men may shave face and neck.  Do not bring valuables to the hospital.  [facility] is not responsible for any belongings or valuables.  Contacts, dentures or bridgework may not be worn into surgery.  Leave your suitcase in the car.  After surgery it may be brought to your room.  For patients admitted to the hospital, discharge time will be determined by your treatment team.  Patients discharged the day of surgery will not be allowed to drive home.    Please read over the following fact sheets that you were given. Anesthesia Post-op Instructions     PATIENT INSTRUCTIONS POST-ANESTHESIA  IMMEDIATELY FOLLOWING SURGERY:  Do not drive or operate machinery for the first twenty four hours after surgery.  Do not make any important decisions for twenty four hours after surgery or while taking narcotic pain medications or sedatives.  If you develop intractable nausea and vomiting or a severe headache please notify your doctor immediately.  FOLLOW-UP:  Please make an appointment with your surgeon as instructed. You do not need to follow up with anesthesia unless specifically instructed to do so.  WOUND CARE INSTRUCTIONS (if applicable):  Keep a dry clean dressing on the anesthesia/puncture wound site if there is drainage.  Once the wound has quit draining you may leave it open to air.  Generally you should  leave the bandage intact for twenty four hours unless there is drainage.  If the epidural site drains for more than 36-48 hours please call the anesthesia department.  QUESTIONS?:  Please feel free to call your physician or the hospital operator if you have any questions, and they will be happy to assist you.      Esophagogastroduodenoscopy Esophagogastroduodenoscopy (EGD) is a procedure that is used to examine the lining of the esophagus, stomach, and first part of the small intestine (duodenum). A long, flexible, lighted tube with a camera attached (endoscope) is inserted down the throat to view these organs. This procedure is done to detect problems or abnormalities, such as inflammation, bleeding, ulcers, or growths, in order to treat them. The procedure lasts 5-20 minutes. It is usually an outpatient procedure, but it may need to be performed in a hospital in emergency cases. LET [facility] CARE PROVIDER KNOW ABOUT:  Any allergies you have.  All medicines you are taking, including vitamins, herbs, eye drops, creams, and over-the-counter medicines.  Previous problems you or members of your family have had with the use of anesthetics.  Any blood disorders you have.  Previous surgeries you have had.  Medical conditions you have. RISKS AND COMPLICATIONS Generally, this is a safe procedure. However, problems can occur and include:  Infection.  Bleeding.  Tearing (perforation) of the esophagus, stomach, or duodenum.  Difficulty breathing or not being able to breathe.  Excessive sweating.  Spasms of the larynx.  Slowed heartbeat.  Low  blood pressure. BEFORE THE PROCEDURE  Do not eat or drink anything after midnight on the night before the procedure or as directed by your health care provider.  Do not take your regular medicines before the procedure if your health care provider asks you not to. Ask your health care provider about changing or stopping those medicines.  If you  wear dentures, be prepared to remove them before the procedure.  Arrange for someone to drive you home after the procedure. PROCEDURE  A numbing medicine (local anesthetic) may be sprayed in your throat for comfort and to stop you from gagging or coughing.  You will have an IV tube inserted in a vein in your hand or arm. You will receive medicines and fluids through this tube.  You will be given a medicine to relax you (sedative).  A pain reliever will be given through the IV tube.  A mouth guard may be placed in your mouth to protect your teeth and to keep you from biting on the endoscope.  You will be asked to lie on your left side.  The endoscope will be inserted down your throat and into your esophagus, stomach, and duodenum.  Air will be put through the endoscope to allow your health care provider to clearly view the lining of your esophagus.  The lining of your esophagus, stomach, and duodenum will be examined. During the exam, your health care provider may:  Remove tissue to be examined under a microscope (biopsy) for inflammation, infection, or other medical problems.  Remove growths.  Remove objects (foreign bodies) that are stuck.  Treat any bleeding with medicines or other devices that stop tissues from bleeding (hot cautery, clipping devices).  Widen (dilate) or stretch narrowed areas of your esophagus and stomach.  The endoscope will be withdrawn. AFTER THE PROCEDURE  You will be taken to a recovery area for observation. Your blood pressure, heart rate, breathing rate, and blood oxygen level will be monitored often until the medicines you were given have worn off.  Do not eat or drink anything until the numbing medicine has worn off and your gag reflex has returned. You may choke.  Your health care provider should be able to discuss his or her findings with you. It will take longer to discuss the test results if any biopsies were taken.   This information is not  intended to replace advice given to you by your health care provider. Make sure you discuss any questions you have with your health care provider.   Document Released: 05/16/2004 Document Revised: 02/03/2014 Document Reviewed: 12/17/2011 Elsevier Interactive Patient Education [employer].

## 2015-10-17 ENCOUNTER — Other Ambulatory Visit (HOSPITAL_COMMUNITY): Payer: Medicare Other

## 2015-10-18 ENCOUNTER — Inpatient Hospital Stay (HOSPITAL_COMMUNITY): Admission: RE | Admit: 2015-10-18 | Payer: Medicare Other | Source: Ambulatory Visit

## 2015-10-22 ENCOUNTER — Encounter (HOSPITAL_COMMUNITY): Payer: Self-pay | Admitting: *Deleted

## 2015-10-22 ENCOUNTER — Encounter (HOSPITAL_COMMUNITY)
Admission: RE | Disposition: A | Discharge status: Home / Self Care | Payer: Self-pay | Source: Ambulatory Visit | Attending: Internal Medicine

## 2015-10-22 ENCOUNTER — Other Ambulatory Visit: Payer: Self-pay

## 2015-10-22 ENCOUNTER — Telehealth: Payer: Self-pay

## 2015-10-22 ENCOUNTER — Ambulatory Visit (HOSPITAL_COMMUNITY)
Admission: RE | Admit: 2015-10-22 | Discharge: 2015-10-22 | Disposition: A | Discharge status: Home / Self Care | Payer: Commercial Managed Care - HMO | Source: Ambulatory Visit | Attending: Internal Medicine | Admitting: Internal Medicine

## 2015-10-22 ENCOUNTER — Ambulatory Visit (HOSPITAL_COMMUNITY): Payer: Commercial Managed Care - HMO | Admitting: Anesthesiology

## 2015-10-22 DIAGNOSIS — K210 Gastro-esophageal reflux disease with esophagitis: Secondary | ICD-10-CM | POA: Diagnosis not present

## 2015-10-22 DIAGNOSIS — F329 Major depressive disorder, single episode, unspecified: Secondary | ICD-10-CM | POA: Diagnosis not present

## 2015-10-22 DIAGNOSIS — G629 Polyneuropathy, unspecified: Secondary | ICD-10-CM | POA: Insufficient documentation

## 2015-10-22 DIAGNOSIS — M1990 Unspecified osteoarthritis, unspecified site: Secondary | ICD-10-CM | POA: Insufficient documentation

## 2015-10-22 DIAGNOSIS — I10 Essential (primary) hypertension: Secondary | ICD-10-CM | POA: Diagnosis not present

## 2015-10-22 DIAGNOSIS — K219 Gastro-esophageal reflux disease without esophagitis: Secondary | ICD-10-CM | POA: Diagnosis not present

## 2015-10-22 DIAGNOSIS — R12 Heartburn: Secondary | ICD-10-CM | POA: Diagnosis not present

## 2015-10-22 DIAGNOSIS — Z79899 Other long term (current) drug therapy: Secondary | ICD-10-CM | POA: Diagnosis not present

## 2015-10-22 DIAGNOSIS — G43909 Migraine, unspecified, not intractable, without status migrainosus: Secondary | ICD-10-CM | POA: Insufficient documentation

## 2015-10-22 DIAGNOSIS — K209 Esophagitis, unspecified: Secondary | ICD-10-CM

## 2015-10-22 DIAGNOSIS — F419 Anxiety disorder, unspecified: Secondary | ICD-10-CM | POA: Insufficient documentation

## 2015-10-22 DIAGNOSIS — Z09 Encounter for follow-up examination after completed treatment for conditions other than malignant neoplasm: Secondary | ICD-10-CM | POA: Diagnosis present

## 2015-10-22 HISTORY — PX: ESOPHAGOGASTRODUODENOSCOPY (EGD) WITH PROPOFOL: SHX5813

## 2015-10-22 SURGERY — ESOPHAGOGASTRODUODENOSCOPY (EGD) WITH PROPOFOL
Anesthesia: Monitor Anesthesia Care

## 2015-10-22 MED ORDER — SODIUM CHLORIDE 0.9 % IJ SOLN
INTRAMUSCULAR | Status: AC
  Filled 2015-10-22: qty 10

## 2015-10-22 MED ORDER — MIDAZOLAM HCL 2 MG/2ML IJ SOLN
INTRAMUSCULAR | Status: AC
  Filled 2015-10-22: qty 2

## 2015-10-22 MED ORDER — CHLORHEXIDINE GLUCONATE CLOTH 2 % EX PADS: 6.0000 | MEDICATED_PAD | Freq: Once | CUTANEOUS | Status: DC

## 2015-10-22 MED ORDER — PROPOFOL 10 MG/ML IV BOLUS
INTRAVENOUS | Status: AC
  Filled 2015-10-22: qty 40

## 2015-10-22 MED ORDER — FENTANYL CITRATE (PF) 100 MCG/2ML IJ SOLN
25.0000 ug | INTRAMUSCULAR | Status: DC | PRN
  Administered 2015-10-22: 25 ug via INTRAVENOUS

## 2015-10-22 MED ORDER — PROPOFOL 500 MG/50ML IV EMUL
INTRAVENOUS | Status: DC | PRN
  Administered 2015-10-22: 100 ug/kg/min via INTRAVENOUS

## 2015-10-22 MED ORDER — EPHEDRINE SULFATE 50 MG/ML IJ SOLN
INTRAMUSCULAR | Status: AC
  Filled 2015-10-22: qty 1

## 2015-10-22 MED ORDER — ONDANSETRON HCL 4 MG/2ML IJ SOLN
4.0000 mg | Freq: Once | INTRAMUSCULAR | Status: AC
  Administered 2015-10-22: 4 mg via INTRAVENOUS

## 2015-10-22 MED ORDER — LACTATED RINGERS IV SOLN
INTRAVENOUS | Status: DC
  Administered 2015-10-22: 07:00:00 via INTRAVENOUS

## 2015-10-22 MED ORDER — HYDROMORPHONE HCL 1 MG/ML IJ SOLN: 0.2500 mg | INTRAMUSCULAR | Status: DC | PRN

## 2015-10-22 MED ORDER — MIDAZOLAM HCL 2 MG/2ML IJ SOLN
1.0000 mg | INTRAMUSCULAR | Status: DC | PRN
  Administered 2015-10-22: 2 mg via INTRAVENOUS

## 2015-10-22 MED ORDER — FENTANYL CITRATE (PF) 100 MCG/2ML IJ SOLN
INTRAMUSCULAR | Status: AC
  Filled 2015-10-22: qty 2

## 2015-10-22 MED ORDER — ONDANSETRON HCL 4 MG/2ML IJ SOLN
INTRAMUSCULAR | Status: AC
  Filled 2015-10-22: qty 2

## 2015-10-22 NOTE — Transfer of Care (Signed)
Immediate Anesthesia Transfer of Care Note  Patient: Ashley [name]  Procedure(s) Performed: Procedure(s) with comments: ESOPHAGOGASTRODUODENOSCOPY (EGD) WITH PROPOFOL (N/A) - 7:30 am  Patient Location: PACU  Anesthesia Type:MAC  Level of Consciousness: sedated  Airway & Oxygen Therapy: Patient Spontanous Breathing and Patient connected to face mask oxygen  Post-op Assessment: Report given to RN, Post -op Vital signs reviewed and stable and Patient moving all extremities  Post vital signs: Reviewed and stable  Last Vitals:  Vitals:   10/22/15 0720 10/22/15 0730  BP: 120/86 113/82  Pulse:    Resp: 12 10  Temp:      Last Pain:  Vitals:   10/22/15 0640  TempSrc: (P) Oral  PainSc: (P) 5       Patients Stated Pain Goal: (P) 7 ([id number])  Complications: No apparent anesthesia complications
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
## 2015-10-22 NOTE — Anesthesia Postprocedure Evaluation (Signed)
Anesthesia Post Note  Patient: Ashley [name]  Procedure(s) Performed: Procedure(s) (LRB): ESOPHAGOGASTRODUODENOSCOPY (EGD) WITH PROPOFOL (N/A)  Patient location during evaluation: PACU Anesthesia Type: MAC Level of consciousness: awake and patient cooperative Pain management: pain level controlled Vital Signs Assessment: post-procedure vital signs reviewed and stable Respiratory status: spontaneous breathing, nonlabored ventilation and respiratory function stable Cardiovascular status: blood pressure returned to baseline Postop Assessment: no signs of nausea or vomiting Anesthetic complications: no    Last Vitals:  Vitals:   10/22/15 0720 10/22/15 0730  BP: 120/86 113/82  Pulse:    Resp: 12 10  Temp:      Last Pain:  Vitals:   10/22/15 0640  TempSrc: (P) Oral  PainSc: (P) 5                  , J

## 2015-10-22 NOTE — Op Note (Signed)
[facility] Patient Name: Ashley [name] Procedure Date: 10/22/2015 7:35 AM MRN: [id number] Date of Birth: [birth year] Attending MD: Norvel Richards , MD CSN: [id number] Age: 50 Admit Type: Outpatient Procedure:                Upper GI endoscopy Indications:              Heartburn Providers:                Norvel Richards, MD, Otis Peak B. Sharon Seller, RN,                            Randa Spike, Technician Referring MD:              Medicines:                Propofol per Anesthesia Complications:            No immediate complications. Estimated Blood Loss:     Estimated blood loss: none. Procedure:                Pre-Anesthesia Assessment:                           - Prior to the procedure, a History and Physical                            was performed, and patient medications and                            allergies were reviewed. The patient's tolerance of                            previous anesthesia was also reviewed. The risks                            and benefits of the procedure and the sedation                            options and risks were discussed with the patient.                            All questions were answered, and informed consent                            was obtained. Prior Anticoagulants: The patient has                            taken no previous anticoagulant or antiplatelet                            agents. ASA Grade Assessment: III - A patient with                            severe systemic disease. After reviewing the risks  and benefits, the patient was deemed in                            satisfactory condition to undergo the procedure.                           After obtaining informed consent, the endoscope was                            passed under direct vision. Throughout the                            procedure, the patient's blood pressure, pulse, and                            oxygen saturations were  monitored continuously. The                            [id number]) scope was introduced through the                            mouth, and advanced to the second part of duodenum.                            The upper GI endoscopy was accomplished without                            difficulty. The patient tolerated the procedure                            well. Scope In: 7:41:11 AM Scope Out: 7:45:26 AM Total Procedure Duration: 0 hours 4 minutes 15 seconds  Findings:      LA Grade B (one or more mucosal breaks greater than 5 mm, not extending       between the tops of two mucosal folds) esophagitis with no bleeding was       found 35 to 36 cm from the incisors. No Barrett's epithelium seen.      Large amount of retained food debris in the stomach precluded complete       examination. Pylorus patent. Normal-appearing first and second portion       of the duodenum Impression:               - LA Grade B esophagitis.                           - retained gastric contents precluded complete                            examination.                           - No specimens collected. Moderate Sedation:      Moderate (conscious) sedation was personally administered by an       anesthesia professional. The following parameters were monitored: oxygen       saturation, heart rate,  blood pressure, respiratory rate, EKG, adequacy       of pulmonary ventilation, and response to care. Total physician       intraservice time was 12 minutes. Recommendation:           - Patient has a contact number available for                            emergencies. The signs and symptoms of potential                            delayed complications were discussed with the                            patient. Return to normal activities tomorrow.                            Written discharge instructions were provided to the                            patient.                           - Advance diet as  tolerated.                           - Use Protonix (pantoprazole) 40 mg PO BID                            indefinitely. Proceed with a solid phase gastric                            emptying study in the near future                           - Continue present medications. Procedure Code(s):        --- Professional ---                           [phone], Esophagogastroduodenoscopy, flexible,                            transoral; diagnostic, including collection of                            specimen(s) by brushing or washing, when performed                            (separate procedure) Diagnosis Code(s):        --- Professional ---                           K20.9, Esophagitis, unspecified                           R12, Heartburn CPT copyright 2016 American Medical Association. All rights reserved. The codes documented in this report are preliminary and upon  coder review may  be revised to meet current compliance requirements. [name] Rourk, MD Norvel Richards, MD 10/22/2015 8:05:15 AM This report has been signed electronically. Number of Addenda: 0

## 2015-10-22 NOTE — Interval H&P Note (Signed)
History and Physical Interval Note:  10/22/2015 7:24 AM  Ashley [name]  has presented today for surgery, with the diagnosis of GERD  The various methods of treatment have been discussed with the patient and family. After consideration of risks, benefits and other options for treatment, the patient has consented to  Procedure(s) with comments: ESOPHAGOGASTRODUODENOSCOPY (EGD) WITH PROPOFOL (N/A) - 7:30 am as a surgical intervention .  The patient's history has been reviewed, patient examined, no change in status, stable for surgery.  I have reviewed the patient's chart and labs.  Questions were answered to the patient's satisfaction.      No dysphagia. No change. EGD per plan. The risks, benefits, limitations, alternatives and imponderables have been reviewed with the patient. Potential for esophageal dilation, biopsy, etc. have also been reviewed.  Questions have been answered. All parties agreeable.  Manus Rudd

## 2015-10-22 NOTE — Anesthesia Preprocedure Evaluation (Signed)
Anesthesia Evaluation  Patient identified by MRN, date of birth, ID band Patient awake    Reviewed: Allergy & Precautions, H&P , NPO status , Patient's Chart, lab work & pertinent test results, reviewed documented beta blocker date and time   History of Anesthesia Complications (+) PONV and history of anesthetic complications  Airway Mallampati: III  TM Distance: >3 FB Neck ROM: Full    Dental no notable dental hx. (+) Teeth Intact, Dental Advisory Given   Pulmonary neg pulmonary ROS,    Pulmonary exam normal breath sounds clear to auscultation       Cardiovascular hypertension, Pt. on medications and Pt. on home beta blockers + dysrhythmias  Rhythm:Regular Rate:Normal     Neuro/Psych  Headaches, PSYCHIATRIC DISORDERS (PTSD ) Anxiety Depression  Neuromuscular disease ( Lumbosacral radiculopathy )    GI/Hepatic Neg liver ROS, PUD, GERD  Medicated and Controlled,  Endo/Other  negative endocrine ROS  Renal/GU negative Renal ROS  negative genitourinary   Musculoskeletal  (+) Arthritis , Osteoarthritis,  Fibromyalgia -  Abdominal   Peds  Hematology negative hematology ROS (+)   Anesthesia Other Findings   Reproductive/Obstetrics negative OB ROS                             Anesthesia Physical Anesthesia Plan  ASA: III  Anesthesia Plan: MAC   Post-op Pain Management:    Induction: Intravenous  Airway Management Planned: Simple Face Mask  Additional Equipment:   Intra-op Plan:   Post-operative Plan:   Informed Consent: I have reviewed the patients History and Physical, chart, labs and discussed the procedure including the risks, benefits and alternatives for the proposed anesthesia with the patient or authorized representative who has indicated his/her understanding and acceptance.     Plan Discussed with:   Anesthesia Plan Comments:         Anesthesia Quick Evaluation

## 2015-10-22 NOTE — Telephone Encounter (Signed)
Pt is set up for [facility] on 10/25/15 @ 8:00. She is aware of appointment

## 2015-10-22 NOTE — Discharge Instructions (Addendum)
EGD Discharge instructions Please read the instructions outlined below and refer to this sheet in the next few weeks. These discharge instructions provide you with general information on caring for yourself after you leave the hospital. Your doctor may also give you specific instructions. While your treatment has been planned according to the most current medical practices available, unavoidable complications occasionally occur. If you have any problems or questions after discharge, please call your doctor. ACTIVITY  You may resume your regular activity but move at a slower pace for the next 24 hours.   Take frequent rest periods for the next 24 hours.   Walking will help expel (get rid of) the air and reduce the bloated feeling in your abdomen.   No driving for 24 hours (because of the anesthesia (medicine) used during the test).   You may shower.   Do not sign any important legal documents or operate any machinery for 24 hours (because of the anesthesia used during the test).  NUTRITION  Drink plenty of fluids.   You may resume your normal diet.   Begin with a light meal and progress to your normal diet.   Avoid alcoholic beverages for 24 hours or as instructed by your caregiver.  MEDICATIONS  You may resume your normal medications unless your caregiver tells you otherwise.  WHAT YOU CAN EXPECT TODAY  You may experience abdominal discomfort such as a feeling of fullness or gas pains.  FOLLOW-UP  Your doctor will discuss the results of your test with you.  SEEK IMMEDIATE MEDICAL ATTENTION IF ANY OF THE FOLLOWING OCCUR:  Excessive nausea (feeling sick to your stomach) and/or vomiting.   Severe abdominal pain and distention (swelling).   Trouble swallowing.   Temperature over 101 F (37.8 C).   Rectal bleeding or vomiting of blood.   GERD information provided  Stomach still with quite a bit of food in it which precluded complete examination  For now, increase  Protonix to 40 mg twice daily  Solid phase gastric emptying study later this week  Further recommendations to follow.   Gastroesophageal Reflux Disease, Adult Normally, food travels down the esophagus and stays in the stomach to be digested. However, when a person has gastroesophageal reflux disease (GERD), food and stomach acid move back up into the esophagus. When this happens, the esophagus becomes sore and inflamed. Over time, GERD can create small holes (ulcers) in the lining of the esophagus.  CAUSES This condition is caused by a problem with the muscle between the esophagus and the stomach (lower esophageal sphincter, or LES). Normally, the LES muscle closes after food passes through the esophagus to the stomach. When the LES is weakened or abnormal, it does not close properly, and that allows food and stomach acid to go back up into the esophagus. The LES can be weakened by certain dietary substances, medicines, and medical conditions, including:  Tobacco use.  Pregnancy.  Having a hiatal hernia.  Heavy alcohol use.  Certain foods and beverages, such as coffee, chocolate, onions, and peppermint. RISK FACTORS This condition is more likely to develop in:  People who have an increased body weight.  People who have connective tissue disorders.  People who use NSAID medicines. SYMPTOMS Symptoms of this condition include:  Heartburn.  Difficult or painful swallowing.  The feeling of having a lump in the throat.  Abitter taste in the mouth.  Bad breath.  Having a large amount of saliva.  Having an upset or bloated stomach.  Belching.  Chest pain.  Shortness of breath or wheezing.  Ongoing (chronic) cough or a night-time cough.  Wearing away of tooth enamel.  Weight loss. Different conditions can cause chest pain. Make sure to see your health care provider if you experience chest pain. DIAGNOSIS Your health care provider will take a medical history and  perform a physical exam. To determine if you have mild or severe GERD, your health care provider may also monitor how you respond to treatment. You may also have other tests, including:  An endoscopy toexamine your stomach and esophagus with a small camera.  A test thatmeasures the acidity level in your esophagus.  A test thatmeasures how much pressure is on your esophagus.  A barium swallow or modified barium swallow to show the shape, size, and functioning of your esophagus. TREATMENT The goal of treatment is to help relieve your symptoms and to prevent complications. Treatment for this condition may vary depending on how severe your symptoms are. Your health care provider may recommend:  Changes to your diet.  Medicine.  Surgery. HOME CARE INSTRUCTIONS Diet  Follow a diet as recommended by your health care provider. This may involve avoiding foods and drinks such as:  Coffee and tea (with or without caffeine).  Drinks that containalcohol.  Energy drinks and sports drinks.  Carbonated drinks or sodas.  Chocolate and cocoa.  Peppermint and mint flavorings.  Garlic and onions.  Horseradish.  Spicy and acidic foods, including peppers, chili powder, curry powder, vinegar, hot sauces, and barbecue sauce.  Citrus fruit juices and citrus fruits, such as oranges, lemons, and limes.  Tomato-based foods, such as red sauce, chili, salsa, and pizza with red sauce.  Fried and fatty foods, such as donuts, french fries, potato chips, and high-fat dressings.  High-fat meats, such as hot dogs and fatty cuts of red and white meats, such as rib eye steak, sausage, ham, and bacon.  High-fat dairy items, such as whole milk, butter, and cream cheese.  Eat small, frequent meals instead of large meals.  Avoid drinking large amounts of liquid with your meals.  Avoid eating meals during the 2-3 hours before bedtime.  Avoid lying down right after you eat.  Do not exercise right  after you eat. General Instructions  Pay attention to any changes in your symptoms.  Take over-the-counter and prescription medicines only as told by your health care provider. Do not take aspirin, ibuprofen, or other NSAIDs unless your health care provider told you to do so.  Do not use any tobacco products, including cigarettes, chewing tobacco, and e-cigarettes. If you need help quitting, ask your health care provider.  Wear loose-fitting clothing. Do not wear anything tight around your waist that causes pressure on your abdomen.  Raise (elevate) the head of your bed 6 inches (15cm).  Try to reduce your stress, such as with yoga or meditation. If you need help reducing stress, ask your health care provider.  If you are overweight, reduce your weight to an amount that is healthy for you. Ask your health care provider for guidance about a safe weight loss goal.  Keep all follow-up visits as told by your health care provider. This is important. SEEK MEDICAL CARE IF:  You have new symptoms.  You have unexplained weight loss.  You have difficulty swallowing, or it hurts to swallow.  You have wheezing or a persistent cough.  Your symptoms do not improve with treatment.  You have a hoarse voice. SEEK IMMEDIATE MEDICAL CARE IF:  You have pain in your arms, neck, jaw, teeth, or back.  You feel sweaty, dizzy, or light-headed.  You have chest pain or shortness of breath.  You vomit and your vomit looks like blood or coffee grounds.  You faint.  Your stool is bloody or black.  You cannot swallow, drink, or eat.   This information is not intended to replace advice given to you by your health care provider. Make sure you discuss any questions you have with your health care provider.   Document Released: 10/23/2004 Document Revised: 10/04/2014 Document Reviewed: 05/10/2014 Elsevier Interactive Patient Education [employer].

## 2015-10-25 ENCOUNTER — Encounter (HOSPITAL_COMMUNITY): Payer: Self-pay

## 2015-10-25 ENCOUNTER — Encounter (HOSPITAL_COMMUNITY)
Admission: RE | Admit: 2015-10-25 | Discharge: 2015-10-25 | Disposition: A | Discharge status: Home / Self Care | Payer: Commercial Managed Care - HMO | Source: Ambulatory Visit | Attending: Internal Medicine | Admitting: Internal Medicine

## 2015-10-25 DIAGNOSIS — K219 Gastro-esophageal reflux disease without esophagitis: Secondary | ICD-10-CM | POA: Insufficient documentation

## 2015-10-25 DIAGNOSIS — R109 Unspecified abdominal pain: Secondary | ICD-10-CM | POA: Diagnosis not present

## 2015-10-25 MED ORDER — TECHNETIUM TC 99M SULFUR COLLOID
2.0000 | Freq: Once | INTRAVENOUS | Status: AC | PRN
  Administered 2015-10-25: 2 via ORAL

## 2015-11-15 ENCOUNTER — Ambulatory Visit (INDEPENDENT_AMBULATORY_CARE_PROVIDER_SITE_OTHER): Payer: Commercial Managed Care - HMO | Admitting: Neurology

## 2015-11-15 ENCOUNTER — Encounter: Payer: Self-pay | Admitting: Neurology

## 2015-11-15 VITALS — BP 115/82 | HR 74 | Ht 65.0 in | Wt 187.5 lb

## 2015-11-15 DIAGNOSIS — M545 Low back pain, unspecified: Secondary | ICD-10-CM

## 2015-11-15 DIAGNOSIS — M797 Fibromyalgia: Secondary | ICD-10-CM

## 2015-11-15 DIAGNOSIS — G4730 Sleep apnea, unspecified: Secondary | ICD-10-CM

## 2015-11-15 DIAGNOSIS — M542 Cervicalgia: Secondary | ICD-10-CM | POA: Diagnosis not present

## 2015-11-15 MED ORDER — BACLOFEN 10 MG PO TABS: 5.0000 mg | ORAL_TABLET | Freq: Three times a day (TID) | ORAL | 3 refills | Status: DC

## 2015-11-15 NOTE — Patient Instructions (Signed)
   WE will get physical therapy and get a cervical traction device. We will start baclofen for the neck pain.
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
## 2015-11-29 ENCOUNTER — Ambulatory Visit: Payer: Commercial Managed Care - HMO | Admitting: Rehabilitative and Restorative Service Providers"

## 2015-12-12 ENCOUNTER — Ambulatory Visit (HOSPITAL_COMMUNITY): Payer: Commercial Managed Care - HMO | Attending: Neurology | Admitting: Physical Therapy

## 2015-12-12 DIAGNOSIS — R252 Cramp and spasm: Secondary | ICD-10-CM | POA: Diagnosis not present

## 2015-12-12 DIAGNOSIS — M542 Cervicalgia: Secondary | ICD-10-CM | POA: Diagnosis not present

## 2015-12-12 DIAGNOSIS — R29898 Other symptoms and signs involving the musculoskeletal system: Secondary | ICD-10-CM | POA: Insufficient documentation

## 2015-12-12 DIAGNOSIS — R293 Abnormal posture: Secondary | ICD-10-CM | POA: Insufficient documentation

## 2015-12-12 NOTE — Patient Instructions (Signed)
   TENNISBALL OS RELEASE  Lying on your back take the rolled up towel and place behind your neck, then place the tennis balls at the base of your skull. Set a timer for no more than 5 minutes.  A. rest on tennis balls  B. retract neck into the tennis balls and tilt head forward and backward as if nodding your head yes (while doing this motion you may turn your head left and right slowly to release the entire occipital ridge)
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
## 2015-12-27 ENCOUNTER — Ambulatory Visit: Payer: Medicare Other | Admitting: Nurse Practitioner

## 2015-12-27 DIAGNOSIS — Z0100 Encounter for examination of eyes and vision without abnormal findings: Secondary | ICD-10-CM | POA: Diagnosis not present

## 2015-12-28 ENCOUNTER — Telehealth (HOSPITAL_COMMUNITY): Payer: Self-pay | Admitting: Physical Therapy

## 2015-12-28 NOTE — Telephone Encounter (Signed)
Pt L/M 12/28/15>Will have surgery on her wrist pt wants to reschedule. I called to reschedule talked with husband and he said to call back she was not there.

## 2015-12-31 ENCOUNTER — Ambulatory Visit: Payer: Commercial Managed Care - HMO | Admitting: Nurse Practitioner

## 2015-12-31 ENCOUNTER — Encounter: Payer: Self-pay | Admitting: Nurse Practitioner

## 2015-12-31 DIAGNOSIS — G43809 Other migraine, not intractable, without status migrainosus: Secondary | ICD-10-CM | POA: Diagnosis not present

## 2015-12-31 DIAGNOSIS — Z6831 Body mass index (BMI) 31.0-31.9, adult: Secondary | ICD-10-CM | POA: Diagnosis not present

## 2015-12-31 DIAGNOSIS — J069 Acute upper respiratory infection, unspecified: Secondary | ICD-10-CM | POA: Diagnosis not present

## 2015-12-31 DIAGNOSIS — F3341 Major depressive disorder, recurrent, in partial remission: Secondary | ICD-10-CM | POA: Diagnosis not present

## 2015-12-31 DIAGNOSIS — R0789 Other chest pain: Secondary | ICD-10-CM | POA: Diagnosis not present

## 2015-12-31 DIAGNOSIS — E6609 Other obesity due to excess calories: Secondary | ICD-10-CM | POA: Diagnosis not present

## 2015-12-31 DIAGNOSIS — M545 Low back pain: Secondary | ICD-10-CM | POA: Diagnosis not present

## 2015-12-31 DIAGNOSIS — F411 Generalized anxiety disorder: Secondary | ICD-10-CM | POA: Diagnosis not present

## 2016-01-02 ENCOUNTER — Ambulatory Visit (HOSPITAL_COMMUNITY): Payer: Commercial Managed Care - HMO | Admitting: Physical Therapy

## 2016-02-04 ENCOUNTER — Ambulatory Visit: Payer: Commercial Managed Care - HMO | Admitting: Nurse Practitioner

## 2016-02-07 DIAGNOSIS — F411 Generalized anxiety disorder: Secondary | ICD-10-CM | POA: Diagnosis not present

## 2016-02-07 DIAGNOSIS — Z6831 Body mass index (BMI) 31.0-31.9, adult: Secondary | ICD-10-CM | POA: Diagnosis not present

## 2016-02-07 DIAGNOSIS — M940 Chondrocostal junction syndrome [Tietze]: Secondary | ICD-10-CM | POA: Diagnosis not present

## 2016-02-07 DIAGNOSIS — R0789 Other chest pain: Secondary | ICD-10-CM | POA: Diagnosis not present

## 2016-02-07 DIAGNOSIS — H81319 Aural vertigo, unspecified ear: Secondary | ICD-10-CM | POA: Diagnosis not present

## 2016-02-08 DIAGNOSIS — I889 Nonspecific lymphadenitis, unspecified: Secondary | ICD-10-CM | POA: Diagnosis not present

## 2016-02-14 ENCOUNTER — Ambulatory Visit: Payer: Commercial Managed Care - HMO | Admitting: Nurse Practitioner

## 2016-02-21 DIAGNOSIS — M4726 Other spondylosis with radiculopathy, lumbar region: Secondary | ICD-10-CM | POA: Diagnosis not present

## 2016-02-22 ENCOUNTER — Other Ambulatory Visit: Payer: Self-pay | Admitting: Orthopedic Surgery

## 2016-02-26 ENCOUNTER — Other Ambulatory Visit: Payer: Self-pay | Admitting: Orthopedic Surgery

## 2016-03-21 ENCOUNTER — Encounter (HOSPITAL_BASED_OUTPATIENT_CLINIC_OR_DEPARTMENT_OTHER): Payer: Self-pay | Admitting: *Deleted

## 2016-03-27 NOTE — Anesthesia Preprocedure Evaluation (Addendum)
Anesthesia Evaluation  Patient identified by MRN, date of birth, ID band Patient awake    Reviewed: Allergy & Precautions, H&P , NPO status , Patient's Chart, lab work & pertinent test results, reviewed documented beta blocker date and time   History of Anesthesia Complications (+) PONV and history of anesthetic complications  Airway Mallampati: III  TM Distance: >3 FB Neck ROM: Full    Dental no notable dental hx. (+) Teeth Intact, Dental Advisory Given   Pulmonary neg pulmonary ROS,    Pulmonary exam normal breath sounds clear to auscultation       Cardiovascular hypertension, Pt. on medications and Pt. on home beta blockers + dysrhythmias  Rhythm:Regular Rate:Normal     Neuro/Psych  Headaches, PSYCHIATRIC DISORDERS (PTSD ) Anxiety Depression  Neuromuscular disease ( Lumbosacral radiculopathy )    GI/Hepatic Neg liver ROS, PUD, GERD  Medicated and Controlled,  Endo/Other  negative endocrine ROS  Renal/GU negative Renal ROS     Musculoskeletal  (+) Arthritis , Osteoarthritis,  Fibromyalgia -  Abdominal   Peds  Hematology negative hematology ROS (+)   Anesthesia Other Findings   Reproductive/Obstetrics negative OB ROS                             Anesthesia Physical  Anesthesia Plan  ASA: III  Anesthesia Plan: Regional and General   Post-op Pain Management:    Induction:   Airway Management Planned: LMA  Additional Equipment:   Intra-op Plan:   Post-operative Plan: Extubation in OR  Informed Consent: I have reviewed the patients History and Physical, chart, labs and discussed the procedure including the risks, benefits and alternatives for the proposed anesthesia with the patient or authorized representative who has indicated his/her understanding and acceptance.   Dental advisory given  Plan Discussed with: CRNA  Anesthesia Plan Comments:        Anesthesia Quick  Evaluation

## 2016-03-28 ENCOUNTER — Ambulatory Visit (HOSPITAL_BASED_OUTPATIENT_CLINIC_OR_DEPARTMENT_OTHER)
Admission: RE | Admit: 2016-03-28 | Discharge: 2016-03-28 | Disposition: A | Discharge status: Home / Self Care | Payer: PPO | Source: Ambulatory Visit | Attending: Orthopedic Surgery | Admitting: Orthopedic Surgery

## 2016-03-28 ENCOUNTER — Ambulatory Visit (HOSPITAL_BASED_OUTPATIENT_CLINIC_OR_DEPARTMENT_OTHER): Payer: PPO | Admitting: Anesthesiology

## 2016-03-28 ENCOUNTER — Encounter (HOSPITAL_BASED_OUTPATIENT_CLINIC_OR_DEPARTMENT_OTHER): Payer: Self-pay | Admitting: *Deleted

## 2016-03-28 ENCOUNTER — Encounter (HOSPITAL_BASED_OUTPATIENT_CLINIC_OR_DEPARTMENT_OTHER)
Admission: RE | Disposition: A | Discharge status: Home / Self Care | Payer: Self-pay | Source: Ambulatory Visit | Attending: Orthopedic Surgery

## 2016-03-28 DIAGNOSIS — G8918 Other acute postprocedural pain: Secondary | ICD-10-CM | POA: Diagnosis not present

## 2016-03-28 DIAGNOSIS — S52502P Unspecified fracture of the lower end of left radius, subsequent encounter for closed fracture with malunion: Secondary | ICD-10-CM | POA: Insufficient documentation

## 2016-03-28 DIAGNOSIS — G5602 Carpal tunnel syndrome, left upper limb: Secondary | ICD-10-CM | POA: Insufficient documentation

## 2016-03-28 DIAGNOSIS — X58XXXD Exposure to other specified factors, subsequent encounter: Secondary | ICD-10-CM | POA: Insufficient documentation

## 2016-03-28 DIAGNOSIS — I10 Essential (primary) hypertension: Secondary | ICD-10-CM | POA: Diagnosis not present

## 2016-03-28 DIAGNOSIS — K219 Gastro-esophageal reflux disease without esophagitis: Secondary | ICD-10-CM | POA: Insufficient documentation

## 2016-03-28 DIAGNOSIS — Z6831 Body mass index (BMI) 31.0-31.9, adult: Secondary | ICD-10-CM | POA: Diagnosis not present

## 2016-03-28 DIAGNOSIS — Z79899 Other long term (current) drug therapy: Secondary | ICD-10-CM | POA: Insufficient documentation

## 2016-03-28 DIAGNOSIS — S52552P Other extraarticular fracture of lower end of left radius, subsequent encounter for closed fracture with malunion: Secondary | ICD-10-CM | POA: Diagnosis not present

## 2016-03-28 DIAGNOSIS — F419 Anxiety disorder, unspecified: Secondary | ICD-10-CM | POA: Insufficient documentation

## 2016-03-28 DIAGNOSIS — F329 Major depressive disorder, single episode, unspecified: Secondary | ICD-10-CM | POA: Diagnosis not present

## 2016-03-28 DIAGNOSIS — E669 Obesity, unspecified: Secondary | ICD-10-CM | POA: Insufficient documentation

## 2016-03-28 DIAGNOSIS — G629 Polyneuropathy, unspecified: Secondary | ICD-10-CM | POA: Insufficient documentation

## 2016-03-28 DIAGNOSIS — M797 Fibromyalgia: Secondary | ICD-10-CM | POA: Insufficient documentation

## 2016-03-28 HISTORY — PX: CARPAL TUNNEL RELEASE: SHX101

## 2016-03-28 HISTORY — PX: WRIST OSTEOTOMY: SHX1093

## 2016-03-28 SURGERY — WRIST OSTEOTOMY
Anesthesia: Regional | Site: Wrist | Laterality: Left

## 2016-03-28 MED ORDER — PROPOFOL 10 MG/ML IV BOLUS
INTRAVENOUS | Status: AC
  Filled 2016-03-28: qty 20

## 2016-03-28 MED ORDER — OXYCODONE HCL 5 MG PO TABS: 5.0000 mg | ORAL_TABLET | Freq: Once | ORAL | Status: DC | PRN

## 2016-03-28 MED ORDER — MEPERIDINE HCL 25 MG/ML IJ SOLN: 6.2500 mg | INTRAMUSCULAR | Status: DC | PRN

## 2016-03-28 MED ORDER — BUPIVACAINE-EPINEPHRINE (PF) 0.5% -1:200000 IJ SOLN
INTRAMUSCULAR | Status: DC | PRN
  Administered 2016-03-28: 30 mL via PERINEURAL

## 2016-03-28 MED ORDER — SCOPOLAMINE 1 MG/3DAYS TD PT72: 1.0000 | MEDICATED_PATCH | Freq: Once | TRANSDERMAL | Status: DC | PRN

## 2016-03-28 MED ORDER — CEFAZOLIN SODIUM-DEXTROSE 2-4 GM/100ML-% IV SOLN
2.0000 g | INTRAVENOUS | Status: AC
  Administered 2016-03-28: 2 g via INTRAVENOUS

## 2016-03-28 MED ORDER — OXYCODONE-ACETAMINOPHEN 5-325 MG PO TABS: 1.0000 | ORAL_TABLET | ORAL | 0 refills | Status: DC | PRN

## 2016-03-28 MED ORDER — PROPOFOL 10 MG/ML IV BOLUS
INTRAVENOUS | Status: DC | PRN
  Administered 2016-03-28: 200 mg via INTRAVENOUS

## 2016-03-28 MED ORDER — EPHEDRINE SULFATE 50 MG/ML IJ SOLN
INTRAMUSCULAR | Status: DC | PRN
  Administered 2016-03-28 (×2): 10 mg via INTRAVENOUS

## 2016-03-28 MED ORDER — LIDOCAINE HCL (CARDIAC) 20 MG/ML IV SOLN
INTRAVENOUS | Status: DC | PRN
  Administered 2016-03-28: 30 mg via INTRAVENOUS

## 2016-03-28 MED ORDER — CEFAZOLIN SODIUM-DEXTROSE 2-4 GM/100ML-% IV SOLN
INTRAVENOUS | Status: AC
  Filled 2016-03-28: qty 100

## 2016-03-28 MED ORDER — MIDAZOLAM HCL 2 MG/2ML IJ SOLN
INTRAMUSCULAR | Status: AC
  Filled 2016-03-28: qty 2

## 2016-03-28 MED ORDER — LACTATED RINGERS IV SOLN
INTRAVENOUS | Status: DC
  Administered 2016-03-28 (×2): via INTRAVENOUS

## 2016-03-28 MED ORDER — HYDROMORPHONE HCL 1 MG/ML IJ SOLN: 0.2500 mg | INTRAMUSCULAR | Status: DC | PRN

## 2016-03-28 MED ORDER — PROMETHAZINE HCL 25 MG/ML IJ SOLN
INTRAMUSCULAR | Status: AC
  Filled 2016-03-28: qty 1

## 2016-03-28 MED ORDER — CHLORHEXIDINE GLUCONATE 4 % EX LIQD: 60.0000 mL | Freq: Once | CUTANEOUS | Status: DC

## 2016-03-28 MED ORDER — ONDANSETRON HCL 4 MG/2ML IJ SOLN
INTRAMUSCULAR | Status: DC | PRN
  Administered 2016-03-28: 2 mg via INTRAVENOUS
  Administered 2016-03-28: 4 mg via INTRAVENOUS

## 2016-03-28 MED ORDER — FENTANYL CITRATE (PF) 100 MCG/2ML IJ SOLN
INTRAMUSCULAR | Status: AC
  Filled 2016-03-28: qty 2

## 2016-03-28 MED ORDER — OXYCODONE HCL 5 MG/5ML PO SOLN: 5.0000 mg | Freq: Once | ORAL | Status: DC | PRN

## 2016-03-28 MED ORDER — PROMETHAZINE HCL 25 MG/ML IJ SOLN
6.2500 mg | INTRAMUSCULAR | Status: DC | PRN
  Administered 2016-03-28: 6.25 mg via INTRAVENOUS

## 2016-03-28 MED ORDER — FENTANYL CITRATE (PF) 100 MCG/2ML IJ SOLN
50.0000 ug | INTRAMUSCULAR | Status: DC | PRN
  Administered 2016-03-28: 100 ug via INTRAVENOUS

## 2016-03-28 MED ORDER — MIDAZOLAM HCL 2 MG/2ML IJ SOLN
1.0000 mg | INTRAMUSCULAR | Status: DC | PRN
  Administered 2016-03-28: 2 mg via INTRAVENOUS

## 2016-03-28 SURGICAL SUPPLY — 83 items
APL SKNCLS STERI-STRIP NONHPOA (GAUZE/BANDAGES/DRESSINGS) ×1
BAG DECANTER FOR FLEXI CONT (MISCELLANEOUS) IMPLANT
BANDAGE ACE 3X5.8 VEL STRL LF (GAUZE/BANDAGES/DRESSINGS) IMPLANT
BANDAGE ACE 4X5 VEL STRL LF (GAUZE/BANDAGES/DRESSINGS) ×3 IMPLANT
BENZOIN TINCTURE PRP APPL 2/3 (GAUZE/BANDAGES/DRESSINGS) ×3 IMPLANT
BIT DRILL 2 FAST STEP (BIT) ×3 IMPLANT
BIT DRILL 2.5X4 QC (BIT) ×3 IMPLANT
BLADE AVERAGE 25MMX9MM (BLADE) ×1
BLADE AVERAGE 25X9 (BLADE) ×2 IMPLANT
BLADE MINI RND TIP GREEN BEAV (BLADE) IMPLANT
BLADE OSC/SAG .038X5.5 CUT EDG (BLADE) ×3 IMPLANT
BLADE SURG 15 STRL LF DISP TIS (BLADE) ×1 IMPLANT
BLADE SURG 15 STRL SS (BLADE) ×3
BNDG CMPR 9X4 STRL LF SNTH (GAUZE/BANDAGES/DRESSINGS) ×1
BNDG ELASTIC 2X5.8 VLCR STR LF (GAUZE/BANDAGES/DRESSINGS) IMPLANT
BNDG ESMARK 4X9 LF (GAUZE/BANDAGES/DRESSINGS) ×3 IMPLANT
BNDG GAUZE ELAST 4 BULKY (GAUZE/BANDAGES/DRESSINGS) ×3 IMPLANT
CANISTER SUCT 1200ML W/VALVE (MISCELLANEOUS) ×3 IMPLANT
CLOSURE WOUND 1/2 X4 (GAUZE/BANDAGES/DRESSINGS) ×1
CORDS BIPOLAR (ELECTRODE) ×3 IMPLANT
COVER BACK TABLE 60X90IN (DRAPES) ×3 IMPLANT
CUFF TOURNIQUET SINGLE 18IN (TOURNIQUET CUFF) ×3 IMPLANT
DECANTER SPIKE VIAL GLASS SM (MISCELLANEOUS) IMPLANT
DRAPE EXTREMITY T 121X128X90 (DRAPE) ×3 IMPLANT
DRAPE OEC MINIVIEW 54X84 (DRAPES) ×3 IMPLANT
DRAPE SURG 17X23 STRL (DRAPES) ×3 IMPLANT
DURAPREP 26ML APPLICATOR (WOUND CARE) ×3 IMPLANT
GAUZE SPONGE 4X4 12PLY STRL (GAUZE/BANDAGES/DRESSINGS) ×3 IMPLANT
GAUZE SPONGE 4X4 16PLY XRAY LF (GAUZE/BANDAGES/DRESSINGS) IMPLANT
GAUZE XEROFORM 1X8 LF (GAUZE/BANDAGES/DRESSINGS) IMPLANT
GLOVE BIOGEL PI IND STRL 7.0 (GLOVE) ×1 IMPLANT
GLOVE BIOGEL PI INDICATOR 7.0 (GLOVE) ×2
GLOVE SURG SYN 7.5  E (GLOVE) ×4
GLOVE SURG SYN 7.5 E (GLOVE) ×2 IMPLANT
GLOVE SURG SYN 8.0 (GLOVE) ×6 IMPLANT
GOWN STRL REUS W/ TWL LRG LVL3 (GOWN DISPOSABLE) ×1 IMPLANT
GOWN STRL REUS W/TWL LRG LVL3 (GOWN DISPOSABLE) ×3
GOWN STRL REUS W/TWL XL LVL3 (GOWN DISPOSABLE) ×6 IMPLANT
MASK DUCK BILL (MISCELLANEOUS) ×2
MASK SURG DUCK BILL (MISCELLANEOUS) ×1 IMPLANT
NEEDLE HYPO 25X1 1.5 SAFETY (NEEDLE) ×3 IMPLANT
NS IRRIG 1000ML POUR BTL (IV SOLUTION) ×3 IMPLANT
PACK BASIN DAY SURGERY FS (CUSTOM PROCEDURE TRAY) ×3 IMPLANT
PAD CAST 3X4 CTTN HI CHSV (CAST SUPPLIES) ×1 IMPLANT
PAD CAST 4YDX4 CTTN HI CHSV (CAST SUPPLIES) ×1 IMPLANT
PADDING CAST ABS 4INX4YD NS (CAST SUPPLIES) ×2
PADDING CAST ABS COTTON 4X4 ST (CAST SUPPLIES) ×1 IMPLANT
PADDING CAST COTTON 3X4 STRL (CAST SUPPLIES) ×3
PADDING CAST COTTON 4X4 STRL (CAST SUPPLIES) ×3
PADDING UNDERCAST 2 STRL (CAST SUPPLIES)
PADDING UNDERCAST 2X4 STRL (CAST SUPPLIES) IMPLANT
PLATE STAN 24.4X59.5 LT (Plate) ×3 IMPLANT
PUTTY DBM STAGRAFT 2CC (Putty) ×3 IMPLANT
SCREW BN 12X3.5XNS CORT TI (Screw) ×2 IMPLANT
SCREW CORT 3.5X10 LNG (Screw) ×6 IMPLANT
SCREW CORT 3.5X12 (Screw) ×6 IMPLANT
SCREW PEG 2.5X20 NONLOCK (Screw) ×12 IMPLANT
SCREW PEG 2.5X22 NONLOCK (Screw) ×9 IMPLANT
SHEET MEDIUM DRAPE 40X70 STRL (DRAPES) ×3 IMPLANT
SPLINT PLASTER CAST XFAST 4X15 (CAST SUPPLIES) ×5 IMPLANT
SPLINT PLASTER XTRA FAST SET 4 (CAST SUPPLIES) ×10
STOCKINETTE 4X48 STRL (DRAPES) ×3 IMPLANT
STRIP CLOSURE SKIN 1/2X4 (GAUZE/BANDAGES/DRESSINGS) ×2 IMPLANT
SUCTION FRAZIER HANDLE 10FR (MISCELLANEOUS)
SUCTION TUBE FRAZIER 10FR DISP (MISCELLANEOUS) IMPLANT
SUT ETHILON 4 0 PS 2 18 (SUTURE) IMPLANT
SUT ETHILON 5 0 PS 2 18 (SUTURE) IMPLANT
SUT MERSILENE 4 0 P 3 (SUTURE) IMPLANT
SUT MON AB 4-0 PC3 18 (SUTURE) IMPLANT
SUT PROLENE 3 0 PS 2 (SUTURE) ×3 IMPLANT
SUT VIC AB 2-0 SH 27 (SUTURE)
SUT VIC AB 2-0 SH 27XBRD (SUTURE) IMPLANT
SUT VIC AB 4-0 P-3 18XBRD (SUTURE) IMPLANT
SUT VIC AB 4-0 P3 18 (SUTURE)
SUT VICRYL 4-0 PS2 18IN ABS (SUTURE) IMPLANT
SUT VICRYL RAPIDE 4-0 (SUTURE) IMPLANT
SUT VICRYL RAPIDE 4/0 PS 2 (SUTURE) IMPLANT
SYR 10ML LL (SYRINGE) ×3 IMPLANT
SYR BULB 3OZ (MISCELLANEOUS) ×3 IMPLANT
TOWEL OR 17X24 6PK STRL BLUE (TOWEL DISPOSABLE) ×3 IMPLANT
TUBE CONNECTING 20'X1/4 (TUBING)
TUBE CONNECTING 20X1/4 (TUBING) IMPLANT
UNDERPAD 30X30 (UNDERPADS AND DIAPERS) ×3 IMPLANT

## 2016-03-28 NOTE — Anesthesia Procedure Notes (Signed)
Procedure Name: LMA Insertion Date/Time: 03/28/2016 7:34 AM Performed by: ,  D Pre-anesthesia Checklist: Patient identified, Emergency Drugs available, Suction available and Patient being monitored Patient Re-evaluated:Patient Re-evaluated prior to inductionOxygen Delivery Method: Circle system utilized Preoxygenation: Pre-oxygenation with 100% oxygen Intubation Type: IV induction Ventilation: Mask ventilation without difficulty LMA: LMA inserted LMA Size: 3.0 Number of attempts: 1 Airway Equipment and Method: Bite block Placement Confirmation: positive ETCO2 Tube secured with: Tape Dental Injury: Teeth and Oropharynx as per pre-operative assessment

## 2016-03-28 NOTE — Op Note (Signed)
See dictated note [phone]

## 2016-03-28 NOTE — Transfer of Care (Signed)
Immediate Anesthesia Transfer of Care Note  Patient: Ashley [name]  Procedure(s) Performed: Procedure(s): LEFT DISTAL RADIUS OSTEOTOMY (Left) LEFT CARPAL TUNNEL RELEASE (Left)  Patient Location: PACU  Anesthesia Type:General and Regional  Level of Consciousness: awake and alert   Airway & Oxygen Therapy: Patient Spontanous Breathing  Post-op Assessment: Report given to RN and Post -op Vital signs reviewed and stable  Post vital signs: Reviewed and stable  Last Vitals:  Vitals:   03/28/16 0915 03/28/16 0959  BP: 132/87 (!) 143/95  Pulse: 79 77  Resp: 16 16  Temp:  36.6 C    Last Pain:  Vitals:   03/28/16 0959  TempSrc: Oral  PainSc:          Complications: No apparent anesthesia complications
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
## 2016-03-28 NOTE — Discharge Instructions (Signed)
Post Anesthesia Home Care Instructions  Activity: Get plenty of rest for the remainder of the day. A responsible adult should stay with you for 24 hours following the procedure.  For the next 24 hours, DO NOT: -Drive a car -Paediatric nurse -Drink alcoholic beverages -Take any medication unless instructed by your physician -Make any legal decisions or sign important papers.  Meals: Start with liquid foods such as gelatin or soup. Progress to regular foods as tolerated. Avoid greasy, spicy, heavy foods. If nausea and/or vomiting occur, drink only clear liquids until the nausea and/or vomiting subsides. Call your physician if vomiting continues.  Special Instructions/Symptoms: Your throat may feel dry or sore from the anesthesia or the breathing tube placed in your throat during surgery. If this causes discomfort, gargle with warm salt water. The discomfort should disappear within 24 hours.  If you had a scopolamine patch placed behind your ear for the management of post- operative nausea and/or vomiting:  1. The medication in the patch is effective for 72 hours, after which it should be removed.  Wrap patch in a tissue and discard in the trash. Wash hands thoroughly with soap and water. 2. You may remove the patch earlier than 72 hours if you experience unpleasant side effects which may include dry mouth, dizziness or visual disturbances. 3. Avoid touching the patch. Wash your hands with soap and water after contact with the patch.      Regional Anesthesia Blocks  1. Numbness or the inability to move the "blocked" extremity may last from 3-48 hours after placement. The length of time depends on the medication injected and your individual response to the medication. If the numbness is not going away after 48 hours, call your surgeon.  2. The extremity that is blocked will need to be protected until the numbness is gone and the  Strength has returned. Because you cannot feel it, you  will need to take extra care to avoid injury. Because it may be weak, you may have difficulty moving it or using it. You may not know what position it is in without looking at it while the block is in effect.  3. For blocks in the legs and feet, returning to weight bearing and walking needs to be done carefully. You will need to wait until the numbness is entirely gone and the strength has returned. You should be able to move your leg and foot normally before you try and bear weight or walk. You will need someone to be with you when you first try to ensure you do not fall and possibly risk injury.  4. Bruising and tenderness at the needle site are common side effects and will resolve in a few days.  5. Persistent numbness or new problems with movement should be communicated to the surgeon or the [facility] [phone] [place] [phone]).Regional Anesthesia Blocks  1. Numbness or the inability to move the "blocked" extremity may last from 3-48 hours after placement. The length of time depends on the medication injected and your individual response to the medication. If the numbness is not going away after 48 hours, call your surgeon.  2. The extremity that is blocked will need to be protected until the numbness is gone and the  Strength has returned. Because you cannot feel it, you will need to take extra care to avoid injury. Because it may be weak, you may have difficulty moving it or using it. You may not know what position  it is in without looking at it while the block is in effect.  3. For blocks in the legs and feet, returning to weight bearing and walking needs to be done carefully. You will need to wait until the numbness is entirely gone and the strength has returned. You should be able to move your leg and foot normally before you try and bear weight or walk. You will need someone to be with you when you first try to ensure you do not fall and possibly risk  injury.  4. Bruising and tenderness at the needle site are common side effects and will resolve in a few days.  5. Persistent numbness or new problems with movement should be communicated to the surgeon or the [place] [phone] [place] [phone]).

## 2016-03-28 NOTE — Anesthesia Postprocedure Evaluation (Signed)
Anesthesia Post Note  Patient: Ashley [name]  Procedure(s) Performed: Procedure(s) (LRB): LEFT DISTAL RADIUS OSTEOTOMY (Left) LEFT CARPAL TUNNEL RELEASE (Left)  Patient location during evaluation: PACU Anesthesia Type: Regional and General Level of consciousness: sedated and patient cooperative Pain management: pain level controlled Vital Signs Assessment: post-procedure vital signs reviewed and stable Respiratory status: spontaneous breathing Cardiovascular status: stable Anesthetic complications: no       Last Vitals:  Vitals:   03/28/16 0915 03/28/16 0959  BP: 132/87 (!) 143/95  Pulse: 79 77  Resp: 16 16  Temp:  36.6 C    Last Pain:  Vitals:   03/28/16 0959  TempSrc: Oral  PainSc:                  Nolon Nations

## 2016-03-28 NOTE — Progress Notes (Signed)
Assisted Dr. Germeroth with left, ultrasound guided, axillary block. Side rails up, monitors on throughout procedure. See vital signs in flow sheet. Tolerated Procedure well. 

## 2016-03-28 NOTE — Anesthesia Procedure Notes (Addendum)
Anesthesia Regional Block: Axillary brachial plexus block   Pre-Anesthetic Checklist: ,, timeout performed, Correct Patient, Correct Site, Correct Laterality, Correct Procedure, Correct Position, site marked, Risks and benefits discussed,  Surgical consent,  Pre-op evaluation,  At surgeon's request and post-op pain management  Laterality: Left  Prep: chloraprep       Needles:  Injection technique: Single-shot     Needle Length: 4cm  Needle Gauge: 22     Additional Needles:   Procedures: ultrasound guided,,,,,,,,  Narrative:  Start time: 03/28/2016 7:20 AM End time: 03/28/2016 7:24 AM Injection made incrementally with aspirations every 5 mL.  Performed by: Personally   Additional Notes: Tolerated well. Good blockade/anesthesia.

## 2016-03-31 NOTE — Op Note (Signed)
[name], [name] NO.:  [id number]  MEDICAL RECORD NO.:  [id number]  LOCATION:                                 FACILITY:  PHYSICIAN:  Sheral Apley. , M.D.DATE OF BIRTH:  04-Jan-1966  DATE OF PROCEDURE:  03/28/2016 DATE OF DISCHARGE:                              OPERATIVE REPORT   PREOPERATIVE DIAGNOSIS:  Malunited left distal radius with carpal tunnel syndrome.  POSTOPERATIVE DIAGNOSIS:  Malunited left distal radius with carpal tunnel syndrome.  PROCEDURES:  Carpal tunnel release, left side, as well as opening wedge osteotomy of malunited left distal radius fracture with fixation with DVR plate and screws and StaGraft, DBM putty bone grafting.  SURGEON:  Sheral Apley. Burney Gauze, M.D.  ASSISTANT:  Marily Lente Dasnoit, PA-C  ANESTHESIA:  Ax block and general.  COMPLICATIONS:  No complications.  DRAINS:  No drains.  DESCRIPTION OF PROCEDURE:  The patient was taken to the operating suite after induction of adequate axillary block analgesia and general laryngeal mask airway anesthetic.  The left upper extremity was prepped and draped in usual sterile fashion.  An Esmarch was used to exsanguinate the limb.  Tourniquet was then inflated to 250 mmHg.  At this point in time, incision was made on the palmar aspect of the distal forearm and wrist on the left over the FCR tendon.  Skin was incised 6-7 cm.  The sheath overlying the FCR was incised.  The FCR was retracted to the midline.  The radial artery to the lateral side, we incised the fascia under the FCR and dissected down to the level of the pronator quadratus.  We identified the malunited distal radius fracture.  In the proximal aspect of the wound, we identified the median nerve.  We traced it to the proximal edge of the transverse carpal ligament.  We created a path dorsal and volar to the transverse carpal ligament and under the appropriate retraction divided from proximal to distal releasing  the median nerve.  We carefully identified and protected the palmar cutaneous branch of the median nerve.  Once this was accomplished, we incised the pronator quadratus and subperiosteally dissected out the volar distal radius.  The malunion site was carefully identified.  Using fluoroscopic imaging, we placed a DVR standard left plate and temporarily fixed it distally.  Fluoroscopy was used to determine adequate plate position.  We then used a small oscillating saw to create an opening wedge osteotomy dorsally.  We then fixed the DVR plate distally with the 7 screws and after the osteotomy had been completed using downward pressure on the proximal aspect of the plate, created the osteotomy.  We then fixed it proximally with four cortical screws. Intraoperative fluoroscopy was then revealed restoration of the normal volar tilt and angle of the distal radius.  We then used the StaGraft, DBM putty to fill the void dorsally.  Fluoroscopy then revealed this to be in good position.  We gently irrigated and then loosely closed in layers of 2-0 undyed Vicryl and a 3-0 Prolene subcuticular stitch on the skin.  Steri-Strips, 4x4s, fluffs and a volar splint were applied.  The patient tolerated this procedure well, went to the recovery  room in stable fashion.     Sheral Apley Burney Gauze, M.D.   ______________________________ Sheral Apley. Burney Gauze, M.D.    MAW/MEDQ  D:  03/28/2016  T:  03/29/2016  Job:  [id number]

## 2016-04-01 ENCOUNTER — Encounter (HOSPITAL_BASED_OUTPATIENT_CLINIC_OR_DEPARTMENT_OTHER): Payer: Self-pay | Admitting: Orthopedic Surgery

## 2016-04-01 DIAGNOSIS — S52552P Other extraarticular fracture of lower end of left radius, subsequent encounter for closed fracture with malunion: Secondary | ICD-10-CM | POA: Diagnosis not present

## 2016-04-01 DIAGNOSIS — M25642 Stiffness of left hand, not elsewhere classified: Secondary | ICD-10-CM | POA: Diagnosis not present

## 2016-04-01 DIAGNOSIS — R29898 Other symptoms and signs involving the musculoskeletal system: Secondary | ICD-10-CM | POA: Diagnosis not present

## 2016-04-01 DIAGNOSIS — M25632 Stiffness of left wrist, not elsewhere classified: Secondary | ICD-10-CM | POA: Diagnosis not present

## 2016-04-01 DIAGNOSIS — M25532 Pain in left wrist: Secondary | ICD-10-CM | POA: Diagnosis not present

## 2016-04-01 DIAGNOSIS — M25432 Effusion, left wrist: Secondary | ICD-10-CM | POA: Diagnosis not present

## 2016-04-22 DIAGNOSIS — S52552P Other extraarticular fracture of lower end of left radius, subsequent encounter for closed fracture with malunion: Secondary | ICD-10-CM | POA: Diagnosis not present

## 2016-04-30 DIAGNOSIS — G4709 Other insomnia: Secondary | ICD-10-CM | POA: Diagnosis not present

## 2016-04-30 DIAGNOSIS — R0789 Other chest pain: Secondary | ICD-10-CM | POA: Diagnosis not present

## 2016-04-30 DIAGNOSIS — F411 Generalized anxiety disorder: Secondary | ICD-10-CM | POA: Diagnosis not present

## 2016-04-30 DIAGNOSIS — E6609 Other obesity due to excess calories: Secondary | ICD-10-CM | POA: Diagnosis not present

## 2016-04-30 DIAGNOSIS — S90122A Contusion of left lesser toe(s) without damage to nail, initial encounter: Secondary | ICD-10-CM | POA: Diagnosis not present

## 2016-04-30 DIAGNOSIS — F3341 Major depressive disorder, recurrent, in partial remission: Secondary | ICD-10-CM | POA: Diagnosis not present

## 2016-04-30 DIAGNOSIS — M545 Low back pain: Secondary | ICD-10-CM | POA: Diagnosis not present

## 2016-04-30 DIAGNOSIS — G43809 Other migraine, not intractable, without status migrainosus: Secondary | ICD-10-CM | POA: Diagnosis not present

## 2016-05-13 DIAGNOSIS — S52552P Other extraarticular fracture of lower end of left radius, subsequent encounter for closed fracture with malunion: Secondary | ICD-10-CM | POA: Diagnosis not present

## 2016-05-13 DIAGNOSIS — M25432 Effusion, left wrist: Secondary | ICD-10-CM | POA: Diagnosis not present

## 2016-05-13 DIAGNOSIS — M25532 Pain in left wrist: Secondary | ICD-10-CM | POA: Diagnosis not present

## 2016-06-03 DIAGNOSIS — S52552P Other extraarticular fracture of lower end of left radius, subsequent encounter for closed fracture with malunion: Secondary | ICD-10-CM | POA: Diagnosis not present

## 2016-07-01 DIAGNOSIS — S52552P Other extraarticular fracture of lower end of left radius, subsequent encounter for closed fracture with malunion: Secondary | ICD-10-CM | POA: Diagnosis not present

## 2016-07-01 DIAGNOSIS — M25532 Pain in left wrist: Secondary | ICD-10-CM | POA: Diagnosis not present

## 2016-07-01 DIAGNOSIS — M25432 Effusion, left wrist: Secondary | ICD-10-CM | POA: Diagnosis not present

## 2016-07-23 ENCOUNTER — Ambulatory Visit (INDEPENDENT_AMBULATORY_CARE_PROVIDER_SITE_OTHER): Payer: PPO | Admitting: Neurology

## 2016-07-23 ENCOUNTER — Encounter: Payer: Self-pay | Admitting: Neurology

## 2016-07-23 VITALS — BP 150/99 | HR 76 | Ht 65.0 in | Wt 188.5 lb

## 2016-07-23 DIAGNOSIS — M5126 Other intervertebral disc displacement, lumbar region: Secondary | ICD-10-CM

## 2016-07-23 DIAGNOSIS — M797 Fibromyalgia: Secondary | ICD-10-CM | POA: Diagnosis not present
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
## 2016-07-23 NOTE — Patient Instructions (Signed)
   We will check EMG and NCV of the legs.

## 2016-07-28 DIAGNOSIS — F411 Generalized anxiety disorder: Secondary | ICD-10-CM | POA: Diagnosis not present

## 2016-07-28 DIAGNOSIS — G43809 Other migraine, not intractable, without status migrainosus: Secondary | ICD-10-CM | POA: Diagnosis not present

## 2016-07-28 DIAGNOSIS — M545 Low back pain: Secondary | ICD-10-CM | POA: Diagnosis not present

## 2016-07-28 DIAGNOSIS — Z6831 Body mass index (BMI) 31.0-31.9, adult: Secondary | ICD-10-CM | POA: Diagnosis not present

## 2016-07-28 DIAGNOSIS — G4709 Other insomnia: Secondary | ICD-10-CM | POA: Diagnosis not present

## 2016-07-28 DIAGNOSIS — Z0001 Encounter for general adult medical examination with abnormal findings: Secondary | ICD-10-CM | POA: Diagnosis not present

## 2016-07-28 DIAGNOSIS — F3341 Major depressive disorder, recurrent, in partial remission: Secondary | ICD-10-CM | POA: Diagnosis not present

## 2016-07-28 DIAGNOSIS — E6609 Other obesity due to excess calories: Secondary | ICD-10-CM | POA: Diagnosis not present

## 2016-08-06 ENCOUNTER — Encounter: Payer: PPO | Admitting: Neurology

## 2016-08-12 ENCOUNTER — Ambulatory Visit (INDEPENDENT_AMBULATORY_CARE_PROVIDER_SITE_OTHER): Payer: PPO | Admitting: Neurology

## 2016-08-12 ENCOUNTER — Ambulatory Visit (INDEPENDENT_AMBULATORY_CARE_PROVIDER_SITE_OTHER): Payer: Self-pay | Admitting: Neurology

## 2016-08-12 ENCOUNTER — Encounter: Payer: Self-pay | Admitting: Neurology

## 2016-08-12 DIAGNOSIS — M5126 Other intervertebral disc displacement, lumbar region: Secondary | ICD-10-CM

## 2016-08-12 DIAGNOSIS — M797 Fibromyalgia: Secondary | ICD-10-CM

## 2016-08-12 NOTE — Progress Notes (Addendum)
MRI of the lumbar spine will be ordered, the patient is having saddle distribution numbness and urinary incontinence that has developed within the last 3 months.   [place]    Nerve / Sites Muscle Latency Ref. Amplitude Ref. Rel Amp Segments Distance Velocity Ref. Area    ms ms mV mV %  cm m/s m/s mVms  L Peroneal - EDB     Ankle EDB 3.2 ?6.5 2.3 ?2.0 100 Ankle - EDB 9   6.4     Fib head EDB 8.3  2.0  86.4 Fib head - Ankle 27 53 ?44 5.3     Pop fossa EDB 10.2  1.9  98.2 Pop fossa - Fib head 10 52 ?44 6.2         Pop fossa - Ankle      R Peroneal - EDB     Ankle EDB 3.5 ?6.5 5.2 ?2.0 100 Ankle - EDB 9   15.2     Fib head EDB 8.9  4.8  91.3 Fib head - Ankle 27 51 ?44 14.4     Pop fossa EDB 10.7  4.6  95.4 Pop fossa - Fib head 10 55 ?44 14.0         Pop fossa - Ankle      L Tibial - AH     Ankle AH 4.2 ?5.8 16.7 ?4.0 100 Ankle - AH 9   27.6     Pop fossa AH 10.6  13.5  81.3 Pop fossa - Ankle 35 55 ?41 24.8  R Tibial - AH     Ankle AH 3.4 ?5.8 21.9 ?4.0 100 Ankle - AH 9   40.4     Pop fossa AH 11.0  17.7  80.7 Pop fossa - Ankle 35 46 ?41 37.4             SNC    Nerve / Sites Rec. Site Peak Lat Ref.  Amp Ref. Segments Distance    ms ms V V  cm  R Sural - Ankle (Calf)     Calf Ankle 3.1 ?4.4 10 ?6 Calf - Ankle 14  L Sural - Ankle (Calf)     Calf Ankle 3.0 ?4.4 11 ?6 Calf - Ankle 14  L Superficial peroneal - Ankle     Lat leg Ankle 3.3 ?4.4 9 ?6 Lat leg - Ankle 14  R Superficial peroneal - Ankle     Lat leg Ankle 3.0 ?4.4 13 ?6 Lat leg - Ankle 14             F  Wave    Nerve F Lat Ref.   ms ms  L Tibial - AH 43.9 ?56.0  R Tibial - AH 43.3 ?56.0         H Reflex    Nerve H Lat Lat Hmax   ms ms   Left Right Ref. Left Right Ref.  Tibial - [place] 37.2 28.6 ?35.0 32.4 34.3 ?35.0         EMG full

## 2016-08-12 NOTE — Procedures (Signed)
     HISTORY:  [name] is a 51 year old patient with a history of prior lumbosacral spine surgery. The patient reports discomfort down the right leg at this time with some numbness into the right great toe. The patient also has reported onset of urinary incontinence and saddle distribution numbness in the groin area that began in [name] 2018. The patient is being evaluated for this issue as well.  NERVE CONDUCTION STUDIES:  Nerve conduction studies were performed on both lower extremities. The distal motor latencies and motor amplitudes for the peroneal and posterior tibial nerves were within normal limits. The nerve conduction velocities for these nerves were also normal. The H reflex latencies were normal on the right, delayed on the left. The sensory latencies for the peroneal and sural nerves were within normal limits.   EMG STUDIES:  EMG study was performed on the right lower extremity:  The tibialis anterior muscle reveals 2 to 4K motor units with slightly decreased recruitment. No fibrillations or positive waves were seen. The peroneus tertius muscle reveals 2 to 6K motor units with decreased recruitment. No fibrillations or positive waves were seen. The medial gastrocnemius muscle reveals 1 to 3K motor units with full recruitment. No fibrillations or positive waves were seen. The vastus lateralis muscle reveals 2 to 4K motor units with full recruitment. No fibrillations or positive waves were seen. The iliopsoas muscle reveals 2 to 4K motor units with full recruitment. No fibrillations or positive waves were seen. The biceps femoris muscle (long head) reveals 2 to 4K motor units with full recruitment. No fibrillations or positive waves were seen. The lumbosacral paraspinal muscles were tested at 3 levels, and revealed no abnormalities of insertional activity at all 3 levels tested. There was good relaxation.   IMPRESSION:  Nerve conduction studies done on both lower extremities  were relatively unremarkable without evidence of a peripheral neuropathy. EMG evaluation of the right lower extremity shows changes that are consistent with a mild stable chronic L5 radiculopathy. No other significant abnormalities were seen.  [name] MD 08/12/2016 3:53 PM  [facility] [address] [place], [place] [address]  Phone [phone] Fax [phone]

## 2016-08-12 NOTE — Progress Notes (Signed)
Please refer to EMG and nerve conduction study procedure note. 

## 2016-08-24 ENCOUNTER — Ambulatory Visit
Admission: RE | Admit: 2016-08-24 | Discharge: 2016-08-24 | Disposition: A | Discharge status: Home / Self Care | Payer: PPO | Source: Ambulatory Visit | Attending: Neurology | Admitting: Neurology

## 2016-08-24 DIAGNOSIS — M5126 Other intervertebral disc displacement, lumbar region: Secondary | ICD-10-CM | POA: Diagnosis not present

## 2016-08-25 ENCOUNTER — Telehealth: Payer: Self-pay | Admitting: Neurology

## 2016-08-25 DIAGNOSIS — G8929 Other chronic pain: Secondary | ICD-10-CM

## 2016-08-25 DIAGNOSIS — M5441 Lumbago with sciatica, right side: Principal | ICD-10-CM

## 2016-08-25 NOTE — Telephone Encounter (Signed)
I called the patient. The MRI of the low back does not show any nerve root compression on the right side. Nothing to explain the urinary incontinence or the groin numbness. We will try an epidural injection.   MRI lumbar 08/25/16:  IMPRESSION:  This MRI of the lumbar spine without contrast shows the following:  1.     The L4-L5 level has been fused. There is no significant foraminal or lateral recess stenosis and no nerve root compression. 2.     At L5-S1, there is a left paramedian disc protrusion that posteriorly displaces the left S1 nerve root.    This appears stable compared to the MRI dated 01/15/2015

## 2016-08-27 ENCOUNTER — Other Ambulatory Visit: Payer: Self-pay | Admitting: Neurology

## 2016-08-27 DIAGNOSIS — G8929 Other chronic pain: Secondary | ICD-10-CM

## 2016-08-27 DIAGNOSIS — M545 Low back pain: Principal | ICD-10-CM

## 2016-08-28 ENCOUNTER — Telehealth: Payer: Self-pay | Admitting: Neurology

## 2016-08-28 MED ORDER — PREDNISONE 5 MG PO TABS: ORAL_TABLET | ORAL | 0 refills | Status: DC

## 2016-08-28 NOTE — Telephone Encounter (Signed)
For the past 2 days she is experiencing shooting pain down the left leg with electrical shocking sensation in the foot and lumbar pain. These symptoms are unbearable. She has tried baclofen with no relief. She is wanting to know if she can have a pain medication, she can pick RX up this afternoon.

## 2016-08-28 NOTE — Telephone Encounter (Signed)
I called the patient. The patient is now having pain down the left leg, this correlates with the possible nerve root impingement at the left S1 level. The patient has had a recent MRI of the low back.  I will place her on a prednisone Dosepak, she has been set up for an epidural steroid injection on 09/05/2016.  If she does not improve the back pain, we may need to consider a surgical referral.  The patient has prednisone listed as an allergy, I called the patient, she indicates that it at one time caused facial swelling, but she has taken the medication since that time without problems. I will remove prednisone from her allergy list.

## 2016-09-05 ENCOUNTER — Ambulatory Visit
Admission: RE | Admit: 2016-09-05 | Discharge: 2016-09-05 | Disposition: A | Discharge status: Home / Self Care | Payer: PPO | Source: Ambulatory Visit | Attending: Neurology | Admitting: Neurology

## 2016-09-05 ENCOUNTER — Other Ambulatory Visit: Payer: Self-pay | Admitting: Neurology

## 2016-09-05 DIAGNOSIS — G8929 Other chronic pain: Secondary | ICD-10-CM

## 2016-09-05 DIAGNOSIS — M545 Low back pain: Principal | ICD-10-CM

## 2016-09-05 MED ORDER — METHYLPREDNISOLONE ACETATE 40 MG/ML INJ SUSP (RADIOLOG
120.0000 mg | Freq: Once | INTRAMUSCULAR | Status: AC
  Administered 2016-09-05: 120 mg via EPIDURAL

## 2016-09-05 MED ORDER — IOPAMIDOL (ISOVUE-M 200) INJECTION 41%
1.0000 mL | Freq: Once | INTRAMUSCULAR | Status: AC
  Administered 2016-09-05: 1 mL via EPIDURAL

## 2016-09-05 NOTE — Discharge Instructions (Signed)

## 2016-09-17 ENCOUNTER — Encounter (HOSPITAL_COMMUNITY): Payer: Self-pay | Admitting: Physical Therapy

## 2016-09-17 NOTE — Therapy (Signed)
[facility] [address] [place], [place], [address] Phone: [phone]   Fax:  [phone]  Patient Details  Name: Ashley [name] MRN: [id number] Date of Birth: 07/16/1965 Referring Provider:  No ref. provider found  Encounter Date: 09/17/2016   PHYSICAL THERAPY DISCHARGE SUMMARY  Visits from Start of Care: 1  Current functional level related to goals / functional outcomes: Patient has not returned since skilled PT evaluation; as indicated in initial evaluation, patient limited by financial reasons.    Remaining deficits: Unable to assess    Education / Equipment: N/A  Plan: Patient agrees to discharge.  Patient goals were not met. Patient is being discharged due to financial reasons.  ?????     Deniece Ree PT, DPT [facility] [address] [place], [place], [address] Phone: [phone]   Fax:  [phone]

## 2016-11-25 DIAGNOSIS — M545 Low back pain: Secondary | ICD-10-CM | POA: Diagnosis not present

## 2016-11-25 DIAGNOSIS — G4709 Other insomnia: Secondary | ICD-10-CM | POA: Diagnosis not present

## 2016-11-25 DIAGNOSIS — Z23 Encounter for immunization: Secondary | ICD-10-CM | POA: Diagnosis not present

## 2016-11-25 DIAGNOSIS — Z6831 Body mass index (BMI) 31.0-31.9, adult: Secondary | ICD-10-CM | POA: Diagnosis not present

## 2016-11-25 DIAGNOSIS — F411 Generalized anxiety disorder: Secondary | ICD-10-CM | POA: Diagnosis not present

## 2016-11-25 DIAGNOSIS — M25552 Pain in left hip: Secondary | ICD-10-CM | POA: Diagnosis not present

## 2016-11-25 DIAGNOSIS — M25562 Pain in left knee: Secondary | ICD-10-CM | POA: Diagnosis not present

## 2016-11-25 DIAGNOSIS — G43809 Other migraine, not intractable, without status migrainosus: Secondary | ICD-10-CM | POA: Diagnosis not present

## 2017-02-09 ENCOUNTER — Encounter: Payer: Self-pay | Admitting: Neurology

## 2017-02-09 ENCOUNTER — Ambulatory Visit (INDEPENDENT_AMBULATORY_CARE_PROVIDER_SITE_OTHER): Payer: PPO | Admitting: Neurology

## 2017-02-09 ENCOUNTER — Ambulatory Visit: Payer: PPO | Admitting: Neurology

## 2017-02-09 VITALS — BP 159/104 | HR 72 | Ht 65.0 in | Wt 200.0 lb

## 2017-02-09 DIAGNOSIS — M5417 Radiculopathy, lumbosacral region: Secondary | ICD-10-CM | POA: Diagnosis not present

## 2017-02-09 DIAGNOSIS — R202 Paresthesia of skin: Secondary | ICD-10-CM | POA: Diagnosis not present

## 2017-02-09 DIAGNOSIS — M797 Fibromyalgia: Secondary | ICD-10-CM

## 2017-02-09 DIAGNOSIS — G479 Sleep disorder, unspecified: Secondary | ICD-10-CM | POA: Diagnosis not present

## 2017-02-09 NOTE — Patient Instructions (Signed)
   We will get an epidural injection of the back.  I will make a referral for a pain center and for a sleep evaluation.  We will get MRI of the brain.
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
## 2017-02-10 ENCOUNTER — Other Ambulatory Visit: Payer: Self-pay | Admitting: Neurology

## 2017-02-10 DIAGNOSIS — M5417 Radiculopathy, lumbosacral region: Secondary | ICD-10-CM

## 2017-02-13 ENCOUNTER — Other Ambulatory Visit: Payer: Self-pay | Admitting: Neurology

## 2017-02-13 ENCOUNTER — Ambulatory Visit
Admission: RE | Admit: 2017-02-13 | Discharge: 2017-02-13 | Disposition: A | Discharge status: Home / Self Care | Payer: PPO | Source: Ambulatory Visit | Attending: Neurology | Admitting: Neurology

## 2017-02-13 DIAGNOSIS — M5417 Radiculopathy, lumbosacral region: Secondary | ICD-10-CM

## 2017-02-13 DIAGNOSIS — M5126 Other intervertebral disc displacement, lumbar region: Secondary | ICD-10-CM | POA: Diagnosis not present

## 2017-02-13 MED ORDER — IOPAMIDOL (ISOVUE-M 200) INJECTION 41%
1.0000 mL | Freq: Once | INTRAMUSCULAR | Status: AC
  Administered 2017-02-13: 1 mL via EPIDURAL

## 2017-02-13 MED ORDER — METHYLPREDNISOLONE ACETATE 40 MG/ML INJ SUSP (RADIOLOG
120.0000 mg | Freq: Once | INTRAMUSCULAR | Status: AC
  Administered 2017-02-13: 120 mg via EPIDURAL

## 2017-02-13 NOTE — Discharge Instructions (Signed)

## 2017-02-19 ENCOUNTER — Ambulatory Visit: Payer: PPO | Admitting: Neurology

## 2017-02-20 ENCOUNTER — Ambulatory Visit
Admission: RE | Admit: 2017-02-20 | Discharge: 2017-02-20 | Disposition: A | Discharge status: Home / Self Care | Payer: PPO | Source: Ambulatory Visit | Attending: Neurology | Admitting: Neurology

## 2017-02-20 DIAGNOSIS — R202 Paresthesia of skin: Secondary | ICD-10-CM | POA: Diagnosis not present

## 2017-02-21 ENCOUNTER — Telehealth: Payer: Self-pay | Admitting: Neurology

## 2017-02-21 NOTE — Telephone Encounter (Signed)
I called the patient.  MRI of the brain is normal.  Nothing to explain the left-sided numbness.    MRI brain 02/20/17:  IMPRESSION:  This is a normal non-contrasted MRI of the brain

## 2017-03-05 ENCOUNTER — Encounter: Payer: Self-pay | Admitting: Neurology

## 2017-03-05 ENCOUNTER — Ambulatory Visit (INDEPENDENT_AMBULATORY_CARE_PROVIDER_SITE_OTHER): Payer: PPO | Admitting: Neurology

## 2017-03-05 VITALS — BP 117/84 | HR 78 | Ht 64.0 in | Wt 196.0 lb

## 2017-03-05 DIAGNOSIS — G8929 Other chronic pain: Secondary | ICD-10-CM | POA: Insufficient documentation

## 2017-03-05 DIAGNOSIS — G4719 Other hypersomnia: Secondary | ICD-10-CM | POA: Diagnosis not present

## 2017-03-05 DIAGNOSIS — Z72821 Inadequate sleep hygiene: Secondary | ICD-10-CM | POA: Diagnosis not present

## 2017-03-05 DIAGNOSIS — F4310 Post-traumatic stress disorder, unspecified: Secondary | ICD-10-CM | POA: Diagnosis not present

## 2017-03-05 DIAGNOSIS — G4701 Insomnia due to medical condition: Secondary | ICD-10-CM

## 2017-03-05 DIAGNOSIS — R0683 Snoring: Secondary | ICD-10-CM | POA: Diagnosis not present
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
## 2017-03-05 NOTE — Patient Instructions (Signed)

## 2017-03-24 DIAGNOSIS — Z6831 Body mass index (BMI) 31.0-31.9, adult: Secondary | ICD-10-CM | POA: Diagnosis not present

## 2017-03-24 DIAGNOSIS — G43809 Other migraine, not intractable, without status migrainosus: Secondary | ICD-10-CM | POA: Diagnosis not present

## 2017-03-24 DIAGNOSIS — G4709 Other insomnia: Secondary | ICD-10-CM | POA: Diagnosis not present

## 2017-03-24 DIAGNOSIS — M25552 Pain in left hip: Secondary | ICD-10-CM | POA: Diagnosis not present

## 2017-03-24 DIAGNOSIS — F411 Generalized anxiety disorder: Secondary | ICD-10-CM | POA: Diagnosis not present

## 2017-03-24 DIAGNOSIS — M25562 Pain in left knee: Secondary | ICD-10-CM | POA: Diagnosis not present

## 2017-03-24 DIAGNOSIS — M545 Low back pain: Secondary | ICD-10-CM | POA: Diagnosis not present

## 2017-03-24 DIAGNOSIS — Z1389 Encounter for screening for other disorder: Secondary | ICD-10-CM | POA: Diagnosis not present

## 2017-04-15 DIAGNOSIS — M797 Fibromyalgia: Secondary | ICD-10-CM | POA: Diagnosis not present

## 2017-04-15 DIAGNOSIS — M961 Postlaminectomy syndrome, not elsewhere classified: Secondary | ICD-10-CM | POA: Diagnosis not present

## 2017-04-15 DIAGNOSIS — M5417 Radiculopathy, lumbosacral region: Secondary | ICD-10-CM | POA: Diagnosis not present

## 2017-04-15 DIAGNOSIS — M5416 Radiculopathy, lumbar region: Secondary | ICD-10-CM | POA: Diagnosis not present

## 2017-04-19 DIAGNOSIS — S0185XA Open bite of other part of head, initial encounter: Secondary | ICD-10-CM | POA: Diagnosis not present

## 2017-04-19 DIAGNOSIS — S01151A Open bite of right eyelid and periocular area, initial encounter: Secondary | ICD-10-CM | POA: Diagnosis not present

## 2017-04-19 DIAGNOSIS — Z7982 Long term (current) use of aspirin: Secondary | ICD-10-CM | POA: Diagnosis not present

## 2017-04-19 DIAGNOSIS — I10 Essential (primary) hypertension: Secondary | ICD-10-CM | POA: Diagnosis not present

## 2017-04-19 DIAGNOSIS — Z79899 Other long term (current) drug therapy: Secondary | ICD-10-CM | POA: Diagnosis not present

## 2017-04-19 DIAGNOSIS — S0081XA Abrasion of other part of head, initial encounter: Secondary | ICD-10-CM | POA: Diagnosis not present

## 2017-04-19 DIAGNOSIS — M797 Fibromyalgia: Secondary | ICD-10-CM | POA: Diagnosis not present

## 2017-04-19 DIAGNOSIS — W540XXA Bitten by dog, initial encounter: Secondary | ICD-10-CM | POA: Diagnosis not present

## 2017-04-19 DIAGNOSIS — S01451A Open bite of right cheek and temporomandibular area, initial encounter: Secondary | ICD-10-CM | POA: Diagnosis not present

## 2017-04-23 ENCOUNTER — Telehealth (HOSPITAL_COMMUNITY): Payer: Self-pay

## 2017-04-23 NOTE — Telephone Encounter (Signed)
Pt l/m  to request earlier apptment--Called patient l/m to call back and we will be glad to work with her on an earlier apptment on any day she preferrs. NF 3/28//2019

## 2017-04-30 ENCOUNTER — Ambulatory Visit (INDEPENDENT_AMBULATORY_CARE_PROVIDER_SITE_OTHER): Payer: PPO | Admitting: Neurology

## 2017-04-30 DIAGNOSIS — G4719 Other hypersomnia: Secondary | ICD-10-CM

## 2017-04-30 DIAGNOSIS — Z72821 Inadequate sleep hygiene: Secondary | ICD-10-CM

## 2017-04-30 DIAGNOSIS — G4733 Obstructive sleep apnea (adult) (pediatric): Secondary | ICD-10-CM

## 2017-04-30 DIAGNOSIS — F4310 Post-traumatic stress disorder, unspecified: Secondary | ICD-10-CM

## 2017-04-30 DIAGNOSIS — R0683 Snoring: Secondary | ICD-10-CM

## 2017-04-30 DIAGNOSIS — G8929 Other chronic pain: Secondary | ICD-10-CM

## 2017-04-30 DIAGNOSIS — G4701 Insomnia due to medical condition: Secondary | ICD-10-CM

## 2017-04-30 DIAGNOSIS — G4734 Idiopathic sleep related nonobstructive alveolar hypoventilation: Principal | ICD-10-CM

## 2017-05-04 ENCOUNTER — Ambulatory Visit (HOSPITAL_COMMUNITY): Payer: PPO | Attending: Physician Assistant

## 2017-05-04 ENCOUNTER — Encounter (HOSPITAL_COMMUNITY): Payer: Self-pay

## 2017-05-04 ENCOUNTER — Telehealth: Payer: Self-pay

## 2017-05-04 DIAGNOSIS — M5442 Lumbago with sciatica, left side: Secondary | ICD-10-CM | POA: Insufficient documentation

## 2017-05-04 DIAGNOSIS — Z9181 History of falling: Secondary | ICD-10-CM | POA: Insufficient documentation

## 2017-05-04 DIAGNOSIS — G8929 Other chronic pain: Secondary | ICD-10-CM | POA: Insufficient documentation

## 2017-05-04 DIAGNOSIS — R2689 Other abnormalities of gait and mobility: Secondary | ICD-10-CM | POA: Insufficient documentation

## 2017-05-04 DIAGNOSIS — M6281 Muscle weakness (generalized): Secondary | ICD-10-CM | POA: Insufficient documentation

## 2017-05-04 NOTE — Telephone Encounter (Signed)
-----   Message from Larey Seat, MD sent at 05/04/2017  3:44 PM EDT ----- IMPRESSION: 1) Mild Obstructive Sleep Apnea (OSA) at AHI 16.9/h.  The patient slept in non -supine position and did not enter REM  sleep.  Hypoxemia with a SpO2 nadir of 82% and 52 minutes in  desaturation. These conditions may contribute to her excessive  daytime sleepiness.   RECOMMENDATIONS:  1. Advise full-night, attended, CPAP titration study to optimize  therapy.  2. Avoid sedative-hypnotics and alcohol which may worsen sleep  apnea (as applicable). 3. Advise to lose weight by diet and exercise, if not  contraindicated (BMI 34).

## 2017-05-04 NOTE — Procedures (Signed)
PATIENT'S NAME:  Ashley [name], Ashley [name] DOB:      [birth year]      MR#:    [id number]     DATE OF RECORDING: 04/30/2017 REFERRING M.D.:  Consuello Masse, M.D./ Floyde Parkins, MD  Study Performed:   Baseline Polysomnogram HISTORY:  Ashley [name] had undergone a previous evaluation for a sleep disorder in January 2016- at the time she was referred by Dr. Jannifer Franklin with a diagnoses of PTSD, chronic pain, neuropathic chronic pain, S 1 radiculopathy/ hypertension, migraine headaches by now chronic migraine headaches, tachycardia and dysrhythmia, gastric esophageal reflux disease, fibromyalgia, anxiety and depression, obesity and incontinence of urine.  She continues to see Dr. Jannifer Franklin in follow-up frequently. She presents today for unwanted behaviors during sleep, for being EDS- excessively daytime sleepy, and been witnessed to snore. The patient endorsed the Epworth Sleepiness Scale at 24/ 24 points.   The patient's weight 196 pounds with a height of 64 (inches), resulting in a BMI of 33.5 kg/m2. The patient's neck circumference measured 16.5 inches.  CURRENT MEDICATIONS: Xanax, Baclofen, Fioricet, Tums, Lexapro, Neurontin, Roxicet, Protonix, Phenergan, Inderal, Topamax, Desyrel   PROCEDURE:  This is a multichannel digital polysomnogram utilizing the SomnoStar 11.2 system.  Electrodes and sensors were applied and monitored per AASM Specifications.   EEG, EOG, Chin and Limb EMG, were sampled at 200 Hz.  ECG, Snore and Nasal Pressure, Thermal Airflow, Respiratory Effort, CPAP Flow and Pressure, Oximetry was sampled at 50 Hz. Digital video and audio were recorded.      BASELINE STUDY: Lights Out was at 21:52 and Lights On at 05:00.  Total recording time (TRT) was 428 minutes, with a total sleep time (TST) of 351 minutes. The patient's sleep latency was 83.5 minutes.  REM latency was 0 minutes.  The sleep efficiency was 82.0 %.     SLEEP ARCHITECTURE: WASO (Wake after sleep onset) was 27.5 minutes.  There were 15 minutes in  Stage N1, 279 minutes Stage N2, 57 minutes Stage N3 and 0 minutes in Stage REM.  The percentage of Stage N1 was 4.3%, Stage N2 was 79.5%, Stage N3 was 16.2% and Stage R (REM sleep) was 0%.   RESPIRATORY ANALYSIS:  There were a total of 99 respiratory events: 47 obstructive apneas, 0 central apneas and 1 mixed apnea with 51 hypopneas. The patient also had 0 respiratory event related arousals (RERAs). The total APNEA/HYPOPNEA INDEX (AHI) was 16.9/hour and the total RESPIRATORY DISTURBANCE INDEX was 16.9 /hour.  0 events occurred in REM sleep and 103 events in NREM. The REM AHI was 0 /hour, versus a non-REM AHI of 16.9. The patient spent 8 minutes of total sleep time in the supine position and 343 minutes in non-supine. The supine AHI was 0.0 versus a non-supine AHI of 17.3.  OXYGEN SATURATION & C02:  The Wake baseline 02 saturation was 96%, with the lowest being 82%. Time spent below 89% saturation equaled 55 minutes.  PERIODIC LIMB MOVEMENTS:   The patient had a total of 0 Periodic Limb Movements.  The arousals were noted as: 27 were spontaneous, 0 were associated with PLMs, and 89 were associated with respiratory events. Audio and video analysis did not show any abnormal or unusual movements, behaviors, phonations or vocalizations.  No nocturia.  Snoring was noted. EKG was in keeping with normal sinus rhythm (NSR).  Post-study, the patient indicated that sleep was the same as usual.    IMPRESSION: 1) Mild Obstructive Sleep Apnea (OSA) at AHI 16.9/h. The patient slept in non -supine  position and did not enter REM sleep.  Hypoxemia with a SpO2 nadir of 82% and 52 minutes in desaturation. These conditions may contribute to her excessive daytime sleepiness.   RECOMMENDATIONS:  1. Advise full-night, attended, CPAP titration study to optimize therapy.   2. Avoid sedative-hypnotics and alcohol which may worsen sleep apnea (as applicable). 3. Advise to lose weight by diet and exercise, if not  contraindicated (BMI 34). 4. Further information regarding OSA may be obtained from [employer] (www.sleepfoundation.org) or American Sleep Apnea Association (www.sleepapnea.org). 5. A follow up appointment will be scheduled in the Sleep Clinic at [facility]. The referring provider will be notified of the results.      I certify that I have reviewed the entire raw data recording prior to the issuance of this report in accordance with the Standards of Accreditation of the American Academy of Sleep Medicine (AASM)      Larey Seat, MD    05-04-2017  Diplomat, [name] Neurology  Diplomat, [name] Sleep Medicine Medical Director of [employer] [facility] [employer]

## 2017-05-04 NOTE — Addendum Note (Signed)
Addended by: Larey Seat on: 05/04/2017 03:44 PM   Modules accepted: Orders

## 2017-05-04 NOTE — Telephone Encounter (Signed)
I called pt. I advised pt that Dr. Brett Fairy reviewed their sleep study results and found that pt has mild osa with hypoxemia and recommends that pt be treated with a cpap. Dr. Brett Fairy recommends that pt return for a repeat sleep study in order to properly titrate the cpap and ensure a good mask fit. Pt is agreeable to returning for a titration study. I advised pt that our sleep lab will file with pt's insurance and call pt to schedule the sleep study when we hear back from the pt's insurance regarding coverage of this sleep study. I advised pt to avoid sedative-hypnotics which may worsen sleep apnea, alcohol and tobacco. I advised pt to pursue weight loss by diet and exercise, if not contraindicated by her other physicians. Pt verbalized understanding of results. Pt had no questions at this time but was encouraged to call back if questions arise.

## 2017-05-05 ENCOUNTER — Ambulatory Visit (HOSPITAL_COMMUNITY): Payer: PPO

## 2017-05-05 ENCOUNTER — Other Ambulatory Visit: Payer: Self-pay

## 2017-05-05 ENCOUNTER — Encounter (HOSPITAL_COMMUNITY): Payer: Self-pay

## 2017-05-05 DIAGNOSIS — R2689 Other abnormalities of gait and mobility: Secondary | ICD-10-CM

## 2017-05-05 DIAGNOSIS — G8929 Other chronic pain: Secondary | ICD-10-CM | POA: Diagnosis not present

## 2017-05-05 DIAGNOSIS — Z9181 History of falling: Secondary | ICD-10-CM

## 2017-05-05 DIAGNOSIS — M5442 Lumbago with sciatica, left side: Principal | ICD-10-CM

## 2017-05-05 DIAGNOSIS — M6281 Muscle weakness (generalized): Secondary | ICD-10-CM | POA: Diagnosis not present
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
## 2017-05-08 DIAGNOSIS — M961 Postlaminectomy syndrome, not elsewhere classified: Secondary | ICD-10-CM | POA: Diagnosis not present

## 2017-05-08 DIAGNOSIS — G894 Chronic pain syndrome: Secondary | ICD-10-CM | POA: Diagnosis not present

## 2017-05-08 DIAGNOSIS — M5416 Radiculopathy, lumbar region: Secondary | ICD-10-CM | POA: Diagnosis not present

## 2017-05-13 ENCOUNTER — Ambulatory Visit (HOSPITAL_COMMUNITY): Payer: PPO

## 2017-05-13 ENCOUNTER — Encounter (HOSPITAL_COMMUNITY): Payer: Self-pay

## 2017-05-13 ENCOUNTER — Other Ambulatory Visit: Payer: Self-pay

## 2017-05-13 DIAGNOSIS — M5442 Lumbago with sciatica, left side: Principal | ICD-10-CM

## 2017-05-13 DIAGNOSIS — M6281 Muscle weakness (generalized): Secondary | ICD-10-CM

## 2017-05-13 DIAGNOSIS — G8929 Other chronic pain: Secondary | ICD-10-CM

## 2017-05-13 DIAGNOSIS — R2689 Other abnormalities of gait and mobility: Secondary | ICD-10-CM

## 2017-05-13 DIAGNOSIS — Z9181 History of falling: Secondary | ICD-10-CM

## 2017-05-13 NOTE — Patient Instructions (Signed)
Outpatient [facility] With guidance from [facility] outpatient behavioral health services, you or a loved one can make positive life changes. Children, adolescents and adults benefit from innovative, evidence-based care. Our experienced psychiatrists, psychologists, physicians, nurses and licensed therapists collaborate to understand your unique needs and to provide treatments to restore your well-being.  [facility] [address], [address]  [place], [place] [address]  Make an Appointment For exceptional mental health and substance abuse care close to home, call [phone].  Individualed Outpatient Services Build a path to recovery and wellness through therapy and education, including: Individual therapy to guide you in managing thoughts, feelings and behaviors.  Group therapy for confidential peer support.  Family and couples therapy to improve relationships.  Children's therapy to addresses emotional, psychological and medical issues is offered in partnership with the [place].  Dialectical skills and cognitive behavioral therapy group to manage borderline personality disorder.  Veterans' therapy to treat post-traumatic stress disorder (PTSD) and other conditions.  Life skills education to teach social skills and strategies for solving problems and making decisions.  Medication management to educate you on how to safely and correctly take medications.  Group Therapy Services For adults diagnosed with a mental health condition, our outpatient group therapy programs can help you learn to function better. If you are interested in getting high quality care without an inpatient stay, consider joining our specialized group for depression, anxiety, bipolar and/or personality disorders. Group sessions are held weekdays from 9 a.m. to noon. Group therapy is ideal  for patients who want to: Explore ways to cope with depression, anxiety, anger, grief and mood swings.  Build self-esteem.  Improve relationship skills.  Learn how to manage medications. To schedule an assessment, call [phone]. No physician referral is required. During the assessment, you will learn about program costs and verify your insurance benefits. Our group therapy programs do not accept Medicaid.
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
## 2017-05-14 ENCOUNTER — Other Ambulatory Visit: Payer: Self-pay

## 2017-05-14 ENCOUNTER — Encounter (HOSPITAL_COMMUNITY): Payer: Self-pay

## 2017-05-14 ENCOUNTER — Ambulatory Visit (HOSPITAL_COMMUNITY): Payer: PPO

## 2017-05-14 DIAGNOSIS — Z9181 History of falling: Secondary | ICD-10-CM

## 2017-05-14 DIAGNOSIS — G8929 Other chronic pain: Secondary | ICD-10-CM

## 2017-05-14 DIAGNOSIS — M5442 Lumbago with sciatica, left side: Principal | ICD-10-CM

## 2017-05-14 DIAGNOSIS — M6281 Muscle weakness (generalized): Secondary | ICD-10-CM

## 2017-05-14 DIAGNOSIS — R2689 Other abnormalities of gait and mobility: Secondary | ICD-10-CM
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
## 2017-05-21 ENCOUNTER — Encounter (HOSPITAL_COMMUNITY): Payer: Self-pay

## 2017-05-21 ENCOUNTER — Ambulatory Visit (HOSPITAL_COMMUNITY): Payer: PPO

## 2017-05-21 DIAGNOSIS — Z9181 History of falling: Secondary | ICD-10-CM

## 2017-05-21 DIAGNOSIS — G8929 Other chronic pain: Secondary | ICD-10-CM

## 2017-05-21 DIAGNOSIS — M5442 Lumbago with sciatica, left side: Principal | ICD-10-CM

## 2017-05-21 DIAGNOSIS — R2689 Other abnormalities of gait and mobility: Secondary | ICD-10-CM

## 2017-05-21 DIAGNOSIS — M6281 Muscle weakness (generalized): Secondary | ICD-10-CM

## 2017-05-21 NOTE — Patient Instructions (Addendum)
Double Knee to Chest (Flexion)    Gently pull both knees toward chest. Feel stretch in lower back or buttock area. May use towel for assistance.  Breathing deeply, Hold 30 seconds. Repeat 3 times. Do 2 sessions per day.  http://gt2.exer.us/228   Copyright  VHI. All rights reserved.   Decompression 1-5 printout given
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
## 2017-05-22 ENCOUNTER — Encounter (HOSPITAL_COMMUNITY): Payer: Self-pay | Admitting: Physical Therapy

## 2017-05-22 ENCOUNTER — Ambulatory Visit (HOSPITAL_COMMUNITY): Payer: PPO | Admitting: Physical Therapy

## 2017-05-22 DIAGNOSIS — M5442 Lumbago with sciatica, left side: Secondary | ICD-10-CM | POA: Diagnosis not present

## 2017-05-22 DIAGNOSIS — M6281 Muscle weakness (generalized): Secondary | ICD-10-CM

## 2017-05-22 DIAGNOSIS — G8929 Other chronic pain: Secondary | ICD-10-CM

## 2017-05-22 DIAGNOSIS — Z9181 History of falling: Secondary | ICD-10-CM

## 2017-05-22 DIAGNOSIS — R2689 Other abnormalities of gait and mobility: Secondary | ICD-10-CM
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
## 2017-05-25 ENCOUNTER — Ambulatory Visit (HOSPITAL_COMMUNITY): Payer: PPO

## 2017-05-25 ENCOUNTER — Telehealth (HOSPITAL_COMMUNITY): Payer: Self-pay

## 2017-05-25 NOTE — Telephone Encounter (Signed)
She is not feeling well and wont be able to come today

## 2017-05-26 DIAGNOSIS — M21372 Foot drop, left foot: Secondary | ICD-10-CM | POA: Diagnosis not present

## 2017-05-27 ENCOUNTER — Ambulatory Visit (HOSPITAL_COMMUNITY): Payer: PPO | Attending: Physician Assistant

## 2017-05-27 ENCOUNTER — Telehealth (HOSPITAL_COMMUNITY): Payer: Self-pay

## 2017-05-27 DIAGNOSIS — G8929 Other chronic pain: Secondary | ICD-10-CM | POA: Insufficient documentation

## 2017-05-27 DIAGNOSIS — R2689 Other abnormalities of gait and mobility: Secondary | ICD-10-CM | POA: Insufficient documentation

## 2017-05-27 DIAGNOSIS — Z9181 History of falling: Secondary | ICD-10-CM | POA: Insufficient documentation

## 2017-05-27 DIAGNOSIS — M6281 Muscle weakness (generalized): Secondary | ICD-10-CM | POA: Insufficient documentation

## 2017-05-27 DIAGNOSIS — M5442 Lumbago with sciatica, left side: Secondary | ICD-10-CM | POA: Insufficient documentation

## 2017-05-27 NOTE — Telephone Encounter (Signed)
No Show # 1: I called Ashley [name] on the home number on file to inquire why she did not arrive to her appointment this morning scheduled for 9:00AM. She stated she forgot and did not have the appointment in her phone schedule. She reports she had a long day yesterday with doctors appointments for herself and her husband from 52 AM-5 PM. She reports she didn't sleep last night and has been trying to sleep on the couch this mornign to recover. I reminded her of her next appointment on Monday 06/01/17 at 2:30 PM and confirmed that she has our front office number. I asked that she call the front office if she needs to cancel or reschedule that appointment.  Kipp Brood, PT, DPT Physical Therapist with [facility]  05/27/2017 9:25 AM

## 2017-06-01 ENCOUNTER — Other Ambulatory Visit: Payer: Self-pay

## 2017-06-01 ENCOUNTER — Ambulatory Visit (HOSPITAL_COMMUNITY): Payer: PPO

## 2017-06-01 ENCOUNTER — Encounter (HOSPITAL_COMMUNITY): Payer: Self-pay

## 2017-06-01 DIAGNOSIS — G894 Chronic pain syndrome: Secondary | ICD-10-CM | POA: Diagnosis not present

## 2017-06-01 DIAGNOSIS — Z9181 History of falling: Secondary | ICD-10-CM

## 2017-06-01 DIAGNOSIS — G8929 Other chronic pain: Secondary | ICD-10-CM

## 2017-06-01 DIAGNOSIS — R2689 Other abnormalities of gait and mobility: Secondary | ICD-10-CM | POA: Diagnosis not present

## 2017-06-01 DIAGNOSIS — M6281 Muscle weakness (generalized): Secondary | ICD-10-CM

## 2017-06-01 DIAGNOSIS — M961 Postlaminectomy syndrome, not elsewhere classified: Secondary | ICD-10-CM | POA: Diagnosis not present

## 2017-06-01 DIAGNOSIS — M5442 Lumbago with sciatica, left side: Secondary | ICD-10-CM | POA: Diagnosis not present

## 2017-06-01 DIAGNOSIS — M5416 Radiculopathy, lumbar region: Secondary | ICD-10-CM | POA: Diagnosis not present

## 2017-06-01 NOTE — Patient Instructions (Signed)
  Supine Double Knee to Chest REPS: 5  SETS: 1-2  HOLD: 30 seconds  WEEKLY: 7x  DAILY: 1x Clinician Notes: rock slightly side to side. Setup Begin lying on your back with your knees bent and feet resting flat on the floor. Movement Using your hands, slowly pull your knees toward your chest until you feel a gentle stretch in your lower back. Tip Make sure to keep your back relaxed during the stretch.
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
## 2017-06-03 ENCOUNTER — Encounter (HOSPITAL_COMMUNITY): Payer: Self-pay

## 2017-06-03 ENCOUNTER — Ambulatory Visit (HOSPITAL_COMMUNITY): Payer: PPO

## 2017-06-03 DIAGNOSIS — R2689 Other abnormalities of gait and mobility: Secondary | ICD-10-CM

## 2017-06-03 DIAGNOSIS — M5442 Lumbago with sciatica, left side: Secondary | ICD-10-CM | POA: Diagnosis not present

## 2017-06-03 DIAGNOSIS — G8929 Other chronic pain: Secondary | ICD-10-CM

## 2017-06-03 DIAGNOSIS — M6281 Muscle weakness (generalized): Secondary | ICD-10-CM

## 2017-06-03 DIAGNOSIS — Z9181 History of falling: Secondary | ICD-10-CM
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
## 2017-06-09 ENCOUNTER — Ambulatory Visit (HOSPITAL_COMMUNITY): Payer: PPO

## 2017-06-09 ENCOUNTER — Telehealth (HOSPITAL_COMMUNITY): Payer: Self-pay

## 2017-06-09 DIAGNOSIS — K210 Gastro-esophageal reflux disease with esophagitis: Secondary | ICD-10-CM | POA: Diagnosis not present

## 2017-06-09 DIAGNOSIS — Z6833 Body mass index (BMI) 33.0-33.9, adult: Secondary | ICD-10-CM | POA: Diagnosis not present

## 2017-06-09 DIAGNOSIS — L03032 Cellulitis of left toe: Secondary | ICD-10-CM | POA: Diagnosis not present

## 2017-06-09 NOTE — Telephone Encounter (Signed)
I called the home/mobile number on file for Ashley [name] to check in with her and see if she is feeling alright as she missed her 2:30 PM appointment for physical therapy today. I was unable to leave a voicemail to remind her of her upcoming appointment on 06/11/17 at 2:30 PM as her mailbox was full.  Kipp Brood, PT, DPT Physical Therapist with [facility]  06/09/2017 3:02 PM

## 2017-06-11 ENCOUNTER — Telehealth (HOSPITAL_COMMUNITY): Payer: Self-pay | Admitting: Family Medicine

## 2017-06-11 ENCOUNTER — Ambulatory Visit (HOSPITAL_COMMUNITY): Payer: PPO

## 2017-06-11 NOTE — Telephone Encounter (Signed)
06/11/17  pt left a message to cx said that she has been sick this week and still having vertigo and can't drive

## 2017-06-16 ENCOUNTER — Ambulatory Visit (HOSPITAL_COMMUNITY): Payer: PPO

## 2017-06-16 ENCOUNTER — Encounter (HOSPITAL_COMMUNITY): Payer: Self-pay

## 2017-06-16 ENCOUNTER — Telehealth: Payer: Self-pay | Admitting: *Deleted

## 2017-06-16 ENCOUNTER — Other Ambulatory Visit: Payer: Self-pay

## 2017-06-16 DIAGNOSIS — Z9181 History of falling: Secondary | ICD-10-CM

## 2017-06-16 DIAGNOSIS — G8929 Other chronic pain: Secondary | ICD-10-CM

## 2017-06-16 DIAGNOSIS — M5442 Lumbago with sciatica, left side: Principal | ICD-10-CM

## 2017-06-16 DIAGNOSIS — R2689 Other abnormalities of gait and mobility: Secondary | ICD-10-CM

## 2017-06-16 DIAGNOSIS — M6281 Muscle weakness (generalized): Secondary | ICD-10-CM
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
## 2017-06-16 NOTE — Telephone Encounter (Signed)
I called the patient.  The patient has a history of a left L5 and S1 nerve root problem.  Beginning in [place] 2018, the patient claims that she began having right-sided back pain and pain down the right leg into the foot with numbness of the great toe on the right.  The patient was seen in the office in June 2018, she was set up for EMG and nerve conduction study evaluation on the right leg which was done in July 2018.  The patient question whether the proper leg was done on the EMG study, I indicated that it was, the patient was having new symptoms of pain and discomfort and numbness of the right leg at that time.  If needed, EMG and nerve conduction study can be done at any time on the left leg.  The patient will talk to her pain management doctor, if he wishes to have this done, I will do it at any time.

## 2017-06-18 ENCOUNTER — Encounter (HOSPITAL_COMMUNITY): Payer: PPO

## 2017-06-23 DIAGNOSIS — Z6833 Body mass index (BMI) 33.0-33.9, adult: Secondary | ICD-10-CM | POA: Diagnosis not present

## 2017-06-23 DIAGNOSIS — W57XXXA Bitten or stung by nonvenomous insect and other nonvenomous arthropods, initial encounter: Secondary | ICD-10-CM | POA: Diagnosis not present

## 2017-06-23 DIAGNOSIS — H81313 Aural vertigo, bilateral: Secondary | ICD-10-CM | POA: Diagnosis not present

## 2017-07-13 ENCOUNTER — Encounter (HOSPITAL_COMMUNITY): Payer: Self-pay

## 2017-07-13 ENCOUNTER — Ambulatory Visit (HOSPITAL_COMMUNITY): Payer: PPO | Attending: Physician Assistant | Admitting: Physical Therapy

## 2017-07-15 ENCOUNTER — Ambulatory Visit (INDEPENDENT_AMBULATORY_CARE_PROVIDER_SITE_OTHER): Payer: PPO | Admitting: Neurology

## 2017-07-15 DIAGNOSIS — G4719 Other hypersomnia: Secondary | ICD-10-CM

## 2017-07-15 DIAGNOSIS — G4734 Idiopathic sleep related nonobstructive alveolar hypoventilation: Principal | ICD-10-CM

## 2017-07-15 DIAGNOSIS — G4733 Obstructive sleep apnea (adult) (pediatric): Secondary | ICD-10-CM

## 2017-07-15 DIAGNOSIS — G4701 Insomnia due to medical condition: Secondary | ICD-10-CM

## 2017-07-15 DIAGNOSIS — Z72821 Inadequate sleep hygiene: Secondary | ICD-10-CM

## 2017-07-15 DIAGNOSIS — G8929 Other chronic pain: Secondary | ICD-10-CM

## 2017-07-15 DIAGNOSIS — F4310 Post-traumatic stress disorder, unspecified: Secondary | ICD-10-CM

## 2017-07-17 NOTE — Addendum Note (Signed)
Addended by: Larey Seat on: 07/17/2017 03:12 PM   Modules accepted: Orders

## 2017-07-17 NOTE — Procedures (Signed)
PATIENT'S NAME:  Ashley [name], Ashley [name] DOB:      January 30, 1965      MR#:    [id number]     DATE OF RECORDING: 07/15/2017 REFERRING M.D.:  Floyde Parkins, MD and Dr. Quintin Alto  Study Performed:   Titration to Positive Airway Pressure HISTORY:  Mrs. Vink returned for a PAP titration following her PSG performed on 04/30/2017, which resulted in an AHI of 16.9/h, 52 minute of desaturation time under 89% SpO2 with a nadir of 82%. The patient endorsed the Epworth Sleepiness Scale at 24 points.   The patient's weight 196 pounds with a height of 64 (inches), resulting in a BMI of 33.5 kg/m2. The patient's neck circumference measured 16 inches.  CURRENT MEDICATIONS: Xanax, Baclofen, Fioricet, Tums, Lexapro, Neurontin, Roxicet, Protonix, Phenergan, Inderal, Topamax, Desyrel   PROCEDURE:  This is a multichannel digital polysomnogram utilizing the SomnoStar 11.2 system.  Electrodes and sensors were applied and monitored per AASM Specifications.   EEG, EOG, Chin and Limb EMG, were sampled at 200 Hz.  ECG, Snore and Nasal Pressure, Thermal Airflow, Respiratory Effort, CPAP Flow and Pressure, Oximetry was sampled at 50 Hz. Digital video and audio were recorded.      CPAP was initiated at 5 cmH20 with heated humidity per AASM split night standards and pressure was advanced to 11/11cmH20 because of hypopneas, apneas and desaturations.  At a PAP pressure of 11 cmH20, there was a reduction of the AHI to 0.0 with improvement of sleep apnea. The patient was fitted with a ResMed AirFit N30 I type with small pillows. Lights Out was at 23:00 and Lights On at 05:13. Total recording time (TRT) was 373 minutes, with a total sleep time (TST) of 352 minutes. The patient's sleep latency was 7.5 minutes. REM latency was 68 minutes.  The sleep efficiency was 94.4 %.    SLEEP ARCHITECTURE: WASO (Wake after sleep onset) was 13.5 minutes.  There were 15 minutes in Stage N1, 126 minutes Stage N2, 133 minutes Stage N3 and 78 minutes in Stage REM.  The  percentage of Stage N1 was 4.3%, Stage N2 was 35.8%, Stage N3 was 37.8% and Stage R (REM sleep) was 22.2%.The sleep architecture was notable for a large proportion of N3 sleep.  RESPIRATORY ANALYSIS:  There was a total of 1 respiratory event: 1 hypopnea with a hypopnea index of 0.2/hour. The patient also had 1 respiratory event related arousal (RERA).     The total APNEA/HYPOPNEA INDEX (AHI) was 0.2 /hour and the total RESPIRATORY DISTURBANCE INDEX was 0.3/hour  0 events occurred in REM sleep and 1 event in NREM. The non-REM AHI was 0.2/hour.  The patient spent 158 minutes of total sleep time in the supine position and 194 minutes in non-supine. The supine AHI was 0.4, versus a non-supine AHI of 0.0.  OXYGEN SATURATION & C02:  The baseline 02 saturation was 98%, with the lowest being 92%. Time spent below 89% saturation equaled 0 minutes.  PERIODIC LIMB MOVEMENTS:  The patient had a total of 70 Periodic Limb Movements. The Periodic Limb Movement (PLM) index was 11.9 and the PLM Arousal index was 1.2 /hour. The arousals were noted as: 8 were spontaneous, 7 were associated with PLMs, and 1 associated with respiratory events.  Audio and video analysis did not show any abnormal or unusual movements, behaviors, phonations or vocalizations.  Snoring was noted before CPAP reached 8 cm water. EKG was in keeping with normal sinus rhythm (NSR). The patient was fitted with a ResMed AirFit N30  i with small pillows.  DIAGNOSIS 1. Obstructive Sleep Apnea  2. Sleep Related Hypoxemia 3. Both conditions improved under CPAP therapy, the patient reached restorative, refreshing sleep with high sleep efficiency.   PLANS/RECOMMENDATIONS: CPAP auto-titration capable device, starting at 6 cm water and maximum pressure of 12 cm water. Heated humidity and EPR of 1 cm water. The patient was fitted with a ResMed AirFit N30 I with small pillows.  DISCUSSION: Post-study, the patient indicated that sleep was much better than  usual and that she appreciated the restorative quality of CPAP therapy. Compliance to CPAP is defined as 4 hours or more of nightly use.  A follow up appointment will be scheduled in the Sleep Clinic at [facility].   Please call [phone] with any questions.     I certify that I have reviewed the entire raw data recording prior to the issuance of this report in accordance with the Standards of Accreditation of the American Academy of Sleep Medicine (AASM)  Larey Seat, M.D.  07-16-2017    Diplomat, American Board of Psychiatry and Neurology  Diplomat, [place] of Sleep Medicine Medical Director, [place] [facility] at [facility]

## 2017-07-21 ENCOUNTER — Telehealth: Payer: Self-pay

## 2017-07-21 NOTE — Telephone Encounter (Signed)
I called pt to discuss her sleep study results, no answer, no VM set up, will try again later.

## 2017-07-21 NOTE — Telephone Encounter (Signed)
I called pt. I advised pt that Dr. Brett Fairy reviewed their sleep study results and found that pt pt has osa and sleep related hypoxemia but did well with cpap therapy. Dr. Brett Fairy recommends that pt start an auto capable cpap. I reviewed PAP compliance expectations with the pt. Pt is agreeable to starting an auto-PAP. I advised pt that an order will be sent to a DME, [facility], per pt request, and [facility] will call the pt within about one week after they file with the pt's insurance. [facility] will show the pt how to use the machine, fit for masks, and troubleshoot the auto-PAP if needed. A follow up appt was made for insurance purposes with Jinny Blossom, NP on 11/12/17 at 10:00am Pt verbalized understanding to arrive 15 minutes early and bring their auto-PAP. A letter with all of this information in it will be mailed to the pt as a reminder. I verified with the pt that the address we have on file is correct. Pt verbalized understanding of results. Pt had no questions at this time but was encouraged to call back if questions arise.

## 2017-07-21 NOTE — Telephone Encounter (Signed)
-----   Message from Larey Seat, MD sent at 07/17/2017  3:12 PM EDT ----- DIAGNOSIS 1. Obstructive Sleep Apnea  2. Sleep Related Hypoxemia 3. Both conditions improved under CPAP therapy, the patient reached restorative, refreshing sleep with high sleep efficiency.   PLANS/RECOMMENDATIONS: CPAP auto-titration capable device, starting at 6 cm water and maximum pressure of 12 cm water. Heated humidity and EPR of 1 cm water. The patient was fitted with a ResMed AirFit N30 I with small pillows.  DISCUSSION: Post-study, the patient indicated that sleep was much better than usual and that she appreciated the restorative quality of CPAP therapy. Compliance to CPAP is defined as 4 hours or more of nightly use.  A follow up appointment will be scheduled in the Sleep Clinic at [facility].   Please call [phone] with any questions.

## 2017-07-24 DIAGNOSIS — F3341 Major depressive disorder, recurrent, in partial remission: Secondary | ICD-10-CM | POA: Diagnosis not present

## 2017-07-24 DIAGNOSIS — K210 Gastro-esophageal reflux disease with esophagitis: Secondary | ICD-10-CM | POA: Diagnosis not present

## 2017-07-24 DIAGNOSIS — M940 Chondrocostal junction syndrome [Tietze]: Secondary | ICD-10-CM | POA: Diagnosis not present

## 2017-07-24 DIAGNOSIS — I889 Nonspecific lymphadenitis, unspecified: Secondary | ICD-10-CM | POA: Diagnosis not present

## 2017-07-24 DIAGNOSIS — F411 Generalized anxiety disorder: Secondary | ICD-10-CM | POA: Diagnosis not present

## 2017-07-24 DIAGNOSIS — R0789 Other chest pain: Secondary | ICD-10-CM | POA: Diagnosis not present

## 2017-07-28 DIAGNOSIS — M545 Low back pain: Secondary | ICD-10-CM | POA: Diagnosis not present

## 2017-07-28 DIAGNOSIS — M25552 Pain in left hip: Secondary | ICD-10-CM | POA: Diagnosis not present

## 2017-07-28 DIAGNOSIS — Z6832 Body mass index (BMI) 32.0-32.9, adult: Secondary | ICD-10-CM | POA: Diagnosis not present

## 2017-07-28 DIAGNOSIS — G43809 Other migraine, not intractable, without status migrainosus: Secondary | ICD-10-CM | POA: Diagnosis not present

## 2017-07-28 DIAGNOSIS — G4733 Obstructive sleep apnea (adult) (pediatric): Secondary | ICD-10-CM | POA: Diagnosis not present

## 2017-07-28 DIAGNOSIS — G4709 Other insomnia: Secondary | ICD-10-CM | POA: Diagnosis not present

## 2017-07-28 DIAGNOSIS — M25562 Pain in left knee: Secondary | ICD-10-CM | POA: Diagnosis not present

## 2017-07-28 DIAGNOSIS — F411 Generalized anxiety disorder: Secondary | ICD-10-CM | POA: Diagnosis not present

## 2017-07-28 DIAGNOSIS — Z0001 Encounter for general adult medical examination with abnormal findings: Secondary | ICD-10-CM | POA: Diagnosis not present

## 2017-07-29 ENCOUNTER — Ambulatory Visit (HOSPITAL_COMMUNITY): Payer: PPO | Attending: Family Medicine | Admitting: Physical Therapy

## 2017-07-29 ENCOUNTER — Ambulatory Visit (INDEPENDENT_AMBULATORY_CARE_PROVIDER_SITE_OTHER): Payer: PPO | Admitting: Neurology

## 2017-07-29 ENCOUNTER — Other Ambulatory Visit: Payer: Self-pay

## 2017-07-29 ENCOUNTER — Encounter (HOSPITAL_COMMUNITY): Payer: Self-pay | Admitting: Physical Therapy

## 2017-07-29 ENCOUNTER — Encounter: Payer: Self-pay | Admitting: Neurology

## 2017-07-29 DIAGNOSIS — M79605 Pain in left leg: Secondary | ICD-10-CM | POA: Diagnosis not present

## 2017-07-29 DIAGNOSIS — R2681 Unsteadiness on feet: Secondary | ICD-10-CM | POA: Insufficient documentation

## 2017-07-29 DIAGNOSIS — M5417 Radiculopathy, lumbosacral region: Secondary | ICD-10-CM

## 2017-07-29 DIAGNOSIS — Z9181 History of falling: Secondary | ICD-10-CM | POA: Diagnosis not present

## 2017-07-29 DIAGNOSIS — G4733 Obstructive sleep apnea (adult) (pediatric): Secondary | ICD-10-CM | POA: Diagnosis not present
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
## 2017-07-29 NOTE — Progress Notes (Signed)
Halfway    Nerve / Sites Muscle Latency Ref. Amplitude Ref. Rel Amp Segments Distance Velocity Ref. Area    ms ms mV mV %  cm m/s m/s mVms  R Peroneal - EDB     Ankle EDB 3.8 ?6.5 5.6 ?2.0 100 Ankle - EDB 9   15.2     Fib head EDB 9.4  5.1  91.8 Fib head - Ankle 29 52 ?44 15.1     Pop fossa EDB 11.3  5.0  96.7 Pop fossa - Fib head 10 53 ?44 14.2         Pop fossa - Ankle      L Peroneal - EDB     Ankle EDB 3.5 ?6.5 1.9 ?2.0 100 Ankle - EDB 9   6.0     Fib head EDB 9.4  1.6  83.4 Fib head - Ankle 29 50 ?44 4.8     Pop fossa EDB 11.5  1.4  85.4 Pop fossa - Fib head 10 48 ?44 4.4         Pop fossa - Ankle      R Tibial - AH     Ankle AH 4.0 ?5.8 16.8 ?4.0 100 Ankle - AH 9   30.6     Pop fossa AH 10.8  14.0  83.5 Pop fossa - Ankle 34 50 ?41 28.6  L Tibial - AH     Ankle AH 3.7 ?5.8 15.2 ?4.0 100 Ankle - AH 9   27.3     Pop fossa AH 11.5  10.0  65.9 Pop fossa - Ankle 34 44 ?41 21.1             SNC    Nerve / Sites Rec. Site Peak Lat Ref.  Amp Ref. Segments Distance    ms ms V V  cm  R Sural - Ankle (Calf)     Calf Ankle 4.0 ?4.4 9 ?6 Calf - Ankle 14  L Sural - Ankle (Calf)     Calf Ankle 4.1 ?4.4 22 ?6 Calf - Ankle 14  R Superficial peroneal - Ankle     Lat leg Ankle 3.7 ?4.4 12 ?6 Lat leg - Ankle 14  L Superficial peroneal - Ankle     Lat leg Ankle 3.6 ?4.4 7 ?6 Lat leg - Ankle 14              F  Wave    Nerve F Lat Ref.   ms ms  R Tibial - AH 46.4 ?56.0  L Tibial - AH 46.9 ?56.0

## 2017-07-29 NOTE — Progress Notes (Signed)
Please refer to EMG nerve conduction study procedure note. 

## 2017-07-29 NOTE — Patient Instructions (Addendum)
Sit to Side-Lying    Sit on edge of bed. 1. Turn head 45 to right. 2. Maintain head position and lie down slowly on left side. Hold until symptoms subside. 3. Sit up slowly. Hold until symptoms subside. 4. Turn head 45 to left. 5. Maintain head position and lie down slowly on right side. Hold until symptoms subside. 6. Sit up slowly. Repeat sequence _5-10___ times per session. Do _4___ sessions per day.  Copyright  VHI. All rights reserved.

## 2017-07-29 NOTE — Procedures (Signed)
     HISTORY:  Ashley [name] is a 52 year old patient with a history of chronic low back pain, the patient is having significant pain in the left lower extremity in the hip and knee and to the ankle.  She will occasionally have collapse of the left knee.  She has had prior lumbosacral spine surgery.  The patient is being evaluated for this ongoing discomfort.  NERVE CONDUCTION STUDIES:  Nerve conduction studies were performed on both lower extremities. The distal motor latencies and motor amplitudes for the peroneal and posterior tibial nerves were within normal limits, with exception that the there is a slightly low motor amplitude for the left peroneal nerve. The nerve conduction velocities for these nerves were also normal. The sensory latencies for the peroneal and sural nerves were within normal limits. The F wave latencies for the posterior tibial nerves were within normal limits.   EMG STUDIES:  EMG study was performed on the left lower extremity:  The tibialis anterior muscle reveals 2 to 4K motor units with full recruitment. No fibrillations or positive waves were seen. The peroneus tertius muscle reveals 2 to 4K motor units with full recruitment. No fibrillations or positive waves were seen. The medial gastrocnemius muscle reveals 1 to 3K motor units with slightly decreased recruitment. No fibrillations or positive waves were seen. The vastus lateralis muscle reveals 2 to 4K motor units with full recruitment. No fibrillations or positive waves were seen. The iliopsoas muscle reveals 2 to 4K motor units with full recruitment. No fibrillations or positive waves were seen. The biceps femoris muscle (long head) reveals 2 to 5K motor units with slightly decreased recruitment. No fibrillations or positive waves were seen. The lumbosacral paraspinal muscles were tested at 3 levels, and revealed no abnormalities of insertional activity at all 3 levels tested. There was good  relaxation.   IMPRESSION:  Nerve conduction studies done on both lower extremities were relatively unremarkable with exception of a slightly low motor amplitude for the left peroneal nerve.  No evidence of a generalized neuropathy is seen.  EMG evaluation of left lower extremity shows findings consistent with a very mild chronic S1 radiculopathy.  [name] MD 07/29/2017 1:41 PM  [facility] [address] [place], [place] [address]  Phone [phone] Fax [phone]

## 2017-08-05 ENCOUNTER — Telehealth (HOSPITAL_COMMUNITY): Payer: Self-pay | Admitting: Physical Therapy

## 2017-08-05 DIAGNOSIS — M961 Postlaminectomy syndrome, not elsewhere classified: Secondary | ICD-10-CM | POA: Diagnosis not present

## 2017-08-05 DIAGNOSIS — M5416 Radiculopathy, lumbar region: Secondary | ICD-10-CM | POA: Diagnosis not present

## 2017-08-05 DIAGNOSIS — G894 Chronic pain syndrome: Secondary | ICD-10-CM | POA: Diagnosis not present

## 2017-08-05 NOTE — Telephone Encounter (Signed)
Pt wants to cx these apptments - she is doing so much better- She will keep the re-eval date or call to cx if she doesn't need it. NF

## 2017-08-06 ENCOUNTER — Ambulatory Visit (HOSPITAL_COMMUNITY): Payer: PPO

## 2017-08-10 ENCOUNTER — Ambulatory Visit: Payer: PPO | Admitting: Adult Health

## 2017-08-11 ENCOUNTER — Encounter (HOSPITAL_COMMUNITY): Payer: PPO

## 2017-08-18 ENCOUNTER — Ambulatory Visit (HOSPITAL_COMMUNITY): Payer: PPO | Admitting: Physical Therapy

## 2017-08-18 ENCOUNTER — Other Ambulatory Visit: Payer: Self-pay

## 2017-08-18 ENCOUNTER — Encounter (HOSPITAL_COMMUNITY): Payer: Self-pay | Admitting: Physical Therapy

## 2017-08-18 DIAGNOSIS — Z9181 History of falling: Secondary | ICD-10-CM | POA: Diagnosis not present

## 2017-08-18 DIAGNOSIS — R2681 Unsteadiness on feet: Secondary | ICD-10-CM
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
## 2017-08-18 NOTE — Patient Instructions (Addendum)
Feet Heel-Toe "Tandem", Head Motion - Eyes Open    With eyes open, right foot directly in front of the other, move head slowly: up and down. Repeat __5-10__ times per session. Do __1__ sessions per day.  Copyright  VHI. All rights reserved.  Single Leg (Compliant Surface) - Eyes Open    Stand on compliant surface: ________ holding support. Lift right leg while maintaining balance over other leg. Progress to removing hands from support surface for longer periods of time. Hold_60___ seconds. Repeat ___5_ times per session. Do ____2 sessions per day.  Copyright  VHI. All rights reserved.

## 2017-08-25 ENCOUNTER — Encounter (HOSPITAL_COMMUNITY): Payer: PPO | Admitting: Physical Therapy

## 2017-08-26 DIAGNOSIS — N6314 Unspecified lump in the right breast, lower inner quadrant: Secondary | ICD-10-CM | POA: Diagnosis not present

## 2017-08-26 DIAGNOSIS — N644 Mastodynia: Secondary | ICD-10-CM | POA: Diagnosis not present

## 2017-08-26 DIAGNOSIS — R928 Other abnormal and inconclusive findings on diagnostic imaging of breast: Secondary | ICD-10-CM | POA: Diagnosis not present

## 2017-08-29 DIAGNOSIS — G4733 Obstructive sleep apnea (adult) (pediatric): Secondary | ICD-10-CM | POA: Diagnosis not present

## 2017-09-29 DIAGNOSIS — G4733 Obstructive sleep apnea (adult) (pediatric): Secondary | ICD-10-CM | POA: Diagnosis not present

## 2017-10-29 DIAGNOSIS — G4733 Obstructive sleep apnea (adult) (pediatric): Secondary | ICD-10-CM | POA: Diagnosis not present

## 2017-11-12 ENCOUNTER — Ambulatory Visit (INDEPENDENT_AMBULATORY_CARE_PROVIDER_SITE_OTHER): Payer: PPO | Admitting: Adult Health

## 2017-11-12 ENCOUNTER — Encounter: Payer: Self-pay | Admitting: Adult Health

## 2017-11-12 VITALS — BP 140/84 | HR 64 | Ht 64.0 in | Wt 188.4 lb

## 2017-11-12 DIAGNOSIS — Z9989 Dependence on other enabling machines and devices: Secondary | ICD-10-CM

## 2017-11-12 DIAGNOSIS — G4733 Obstructive sleep apnea (adult) (pediatric): Secondary | ICD-10-CM | POA: Diagnosis not present
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
## 2017-11-12 NOTE — Patient Instructions (Signed)
Your Plan:  Try using CPAP nightly and > 4 hours each night If your symptoms worsen or you develop new symptoms please let us know.   Thank you for coming to see [place] at [facility]. I hope we have been able to provide you high quality care today.  You may receive a patient satisfaction survey over the next few weeks. We would appreciate your feedback and comments so that we may continue to improve ourselves and the health of our patients.

## 2017-11-17 ENCOUNTER — Ambulatory Visit: Payer: Self-pay | Admitting: Nurse Practitioner

## 2017-11-25 DIAGNOSIS — R739 Hyperglycemia, unspecified: Secondary | ICD-10-CM | POA: Diagnosis not present

## 2017-11-25 DIAGNOSIS — K210 Gastro-esophageal reflux disease with esophagitis: Secondary | ICD-10-CM | POA: Diagnosis not present

## 2017-11-25 DIAGNOSIS — F411 Generalized anxiety disorder: Secondary | ICD-10-CM | POA: Diagnosis not present

## 2017-11-29 DIAGNOSIS — G4733 Obstructive sleep apnea (adult) (pediatric): Secondary | ICD-10-CM | POA: Diagnosis not present

## 2017-12-02 DIAGNOSIS — Z6831 Body mass index (BMI) 31.0-31.9, adult: Secondary | ICD-10-CM | POA: Diagnosis not present

## 2017-12-02 DIAGNOSIS — M25552 Pain in left hip: Secondary | ICD-10-CM | POA: Diagnosis not present

## 2017-12-02 DIAGNOSIS — G43809 Other migraine, not intractable, without status migrainosus: Secondary | ICD-10-CM | POA: Diagnosis not present

## 2017-12-02 DIAGNOSIS — G43909 Migraine, unspecified, not intractable, without status migrainosus: Secondary | ICD-10-CM | POA: Diagnosis not present

## 2017-12-02 DIAGNOSIS — M25562 Pain in left knee: Secondary | ICD-10-CM | POA: Diagnosis not present

## 2017-12-02 DIAGNOSIS — M545 Low back pain: Secondary | ICD-10-CM | POA: Diagnosis not present

## 2017-12-02 DIAGNOSIS — F411 Generalized anxiety disorder: Secondary | ICD-10-CM | POA: Diagnosis not present

## 2017-12-02 DIAGNOSIS — Z23 Encounter for immunization: Secondary | ICD-10-CM | POA: Diagnosis not present

## 2017-12-15 DIAGNOSIS — M533 Sacrococcygeal disorders, not elsewhere classified: Secondary | ICD-10-CM | POA: Diagnosis not present

## 2017-12-15 DIAGNOSIS — M5416 Radiculopathy, lumbar region: Secondary | ICD-10-CM | POA: Diagnosis not present

## 2017-12-15 DIAGNOSIS — M961 Postlaminectomy syndrome, not elsewhere classified: Secondary | ICD-10-CM | POA: Diagnosis not present

## 2018-01-05 DIAGNOSIS — G4709 Other insomnia: Secondary | ICD-10-CM | POA: Diagnosis not present

## 2018-01-05 DIAGNOSIS — M25562 Pain in left knee: Secondary | ICD-10-CM | POA: Diagnosis not present

## 2018-01-05 DIAGNOSIS — F3341 Major depressive disorder, recurrent, in partial remission: Secondary | ICD-10-CM | POA: Diagnosis not present

## 2018-01-05 DIAGNOSIS — G43809 Other migraine, not intractable, without status migrainosus: Secondary | ICD-10-CM | POA: Diagnosis not present

## 2018-01-05 DIAGNOSIS — Z6831 Body mass index (BMI) 31.0-31.9, adult: Secondary | ICD-10-CM | POA: Diagnosis not present

## 2018-01-05 DIAGNOSIS — G4733 Obstructive sleep apnea (adult) (pediatric): Secondary | ICD-10-CM | POA: Diagnosis not present

## 2018-01-05 DIAGNOSIS — M25552 Pain in left hip: Secondary | ICD-10-CM | POA: Diagnosis not present

## 2018-01-05 DIAGNOSIS — M545 Low back pain: Secondary | ICD-10-CM | POA: Diagnosis not present

## 2018-01-14 DIAGNOSIS — M5416 Radiculopathy, lumbar region: Secondary | ICD-10-CM | POA: Diagnosis not present

## 2018-01-14 DIAGNOSIS — M533 Sacrococcygeal disorders, not elsewhere classified: Secondary | ICD-10-CM | POA: Diagnosis not present

## 2018-01-14 DIAGNOSIS — M961 Postlaminectomy syndrome, not elsewhere classified: Secondary | ICD-10-CM | POA: Diagnosis not present

## 2018-01-25 DIAGNOSIS — K210 Gastro-esophageal reflux disease with esophagitis: Secondary | ICD-10-CM | POA: Diagnosis not present

## 2018-01-25 DIAGNOSIS — M545 Low back pain: Secondary | ICD-10-CM | POA: Diagnosis not present

## 2018-01-25 DIAGNOSIS — G43909 Migraine, unspecified, not intractable, without status migrainosus: Secondary | ICD-10-CM | POA: Diagnosis not present

## 2018-01-25 DIAGNOSIS — F411 Generalized anxiety disorder: Secondary | ICD-10-CM | POA: Diagnosis not present

## 2018-02-10 DIAGNOSIS — M533 Sacrococcygeal disorders, not elsewhere classified: Secondary | ICD-10-CM | POA: Diagnosis not present

## 2018-02-25 DIAGNOSIS — F331 Major depressive disorder, recurrent, moderate: Secondary | ICD-10-CM | POA: Diagnosis not present

## 2018-02-25 DIAGNOSIS — I10 Essential (primary) hypertension: Secondary | ICD-10-CM | POA: Diagnosis not present

## 2018-03-01 DIAGNOSIS — F4542 Pain disorder with related psychological factors: Secondary | ICD-10-CM | POA: Diagnosis not present

## 2018-03-01 DIAGNOSIS — M961 Postlaminectomy syndrome, not elsewhere classified: Secondary | ICD-10-CM | POA: Diagnosis not present

## 2018-03-02 DIAGNOSIS — M797 Fibromyalgia: Secondary | ICD-10-CM | POA: Diagnosis not present

## 2018-03-02 DIAGNOSIS — Z79899 Other long term (current) drug therapy: Secondary | ICD-10-CM | POA: Diagnosis not present

## 2018-03-02 DIAGNOSIS — M549 Dorsalgia, unspecified: Secondary | ICD-10-CM | POA: Diagnosis not present

## 2018-03-02 DIAGNOSIS — F329 Major depressive disorder, single episode, unspecified: Secondary | ICD-10-CM | POA: Diagnosis not present

## 2018-03-02 DIAGNOSIS — I10 Essential (primary) hypertension: Secondary | ICD-10-CM | POA: Diagnosis not present

## 2018-03-02 DIAGNOSIS — F419 Anxiety disorder, unspecified: Secondary | ICD-10-CM | POA: Diagnosis not present

## 2018-03-02 DIAGNOSIS — R079 Chest pain, unspecified: Secondary | ICD-10-CM | POA: Diagnosis not present

## 2018-03-02 DIAGNOSIS — G8929 Other chronic pain: Secondary | ICD-10-CM | POA: Diagnosis not present

## 2018-03-02 DIAGNOSIS — F4310 Post-traumatic stress disorder, unspecified: Secondary | ICD-10-CM | POA: Diagnosis not present

## 2018-03-10 DIAGNOSIS — Z6831 Body mass index (BMI) 31.0-31.9, adult: Secondary | ICD-10-CM | POA: Diagnosis not present

## 2018-03-10 DIAGNOSIS — M7918 Myalgia, other site: Secondary | ICD-10-CM | POA: Diagnosis not present

## 2018-03-10 DIAGNOSIS — R079 Chest pain, unspecified: Secondary | ICD-10-CM | POA: Diagnosis not present

## 2018-03-10 DIAGNOSIS — H9209 Otalgia, unspecified ear: Secondary | ICD-10-CM | POA: Diagnosis not present

## 2018-03-10 DIAGNOSIS — I10 Essential (primary) hypertension: Secondary | ICD-10-CM | POA: Diagnosis not present

## 2018-03-10 DIAGNOSIS — G43909 Migraine, unspecified, not intractable, without status migrainosus: Secondary | ICD-10-CM | POA: Diagnosis not present

## 2018-03-10 DIAGNOSIS — R002 Palpitations: Secondary | ICD-10-CM | POA: Diagnosis not present

## 2018-03-15 DIAGNOSIS — M5416 Radiculopathy, lumbar region: Secondary | ICD-10-CM | POA: Diagnosis not present

## 2018-03-15 DIAGNOSIS — M961 Postlaminectomy syndrome, not elsewhere classified: Secondary | ICD-10-CM | POA: Diagnosis not present

## 2018-03-15 DIAGNOSIS — M533 Sacrococcygeal disorders, not elsewhere classified: Secondary | ICD-10-CM | POA: Diagnosis not present

## 2018-03-25 DIAGNOSIS — R7301 Impaired fasting glucose: Secondary | ICD-10-CM | POA: Diagnosis not present

## 2018-03-25 DIAGNOSIS — K210 Gastro-esophageal reflux disease with esophagitis: Secondary | ICD-10-CM | POA: Diagnosis not present

## 2018-03-25 DIAGNOSIS — R739 Hyperglycemia, unspecified: Secondary | ICD-10-CM | POA: Diagnosis not present

## 2018-03-25 DIAGNOSIS — I10 Essential (primary) hypertension: Secondary | ICD-10-CM | POA: Diagnosis not present

## 2018-03-25 DIAGNOSIS — E8881 Metabolic syndrome: Secondary | ICD-10-CM | POA: Diagnosis not present

## 2018-03-26 DIAGNOSIS — F419 Anxiety disorder, unspecified: Secondary | ICD-10-CM | POA: Diagnosis not present

## 2018-03-26 DIAGNOSIS — F331 Major depressive disorder, recurrent, moderate: Secondary | ICD-10-CM | POA: Diagnosis not present

## 2018-03-26 DIAGNOSIS — I10 Essential (primary) hypertension: Secondary | ICD-10-CM | POA: Diagnosis not present

## 2018-03-31 DIAGNOSIS — J340 Abscess, furuncle and carbuncle of nose: Secondary | ICD-10-CM | POA: Diagnosis not present

## 2018-03-31 DIAGNOSIS — R7301 Impaired fasting glucose: Secondary | ICD-10-CM | POA: Diagnosis not present

## 2018-03-31 DIAGNOSIS — G43909 Migraine, unspecified, not intractable, without status migrainosus: Secondary | ICD-10-CM | POA: Diagnosis not present

## 2018-03-31 DIAGNOSIS — Z6831 Body mass index (BMI) 31.0-31.9, adult: Secondary | ICD-10-CM | POA: Diagnosis not present

## 2018-03-31 DIAGNOSIS — Z1389 Encounter for screening for other disorder: Secondary | ICD-10-CM | POA: Diagnosis not present

## 2018-03-31 DIAGNOSIS — Z1331 Encounter for screening for depression: Secondary | ICD-10-CM | POA: Diagnosis not present

## 2018-03-31 DIAGNOSIS — G43809 Other migraine, not intractable, without status migrainosus: Secondary | ICD-10-CM | POA: Diagnosis not present

## 2018-04-01 DIAGNOSIS — M5416 Radiculopathy, lumbar region: Secondary | ICD-10-CM | POA: Diagnosis not present

## 2018-05-27 DIAGNOSIS — M797 Fibromyalgia: Secondary | ICD-10-CM | POA: Diagnosis not present

## 2018-05-27 DIAGNOSIS — G629 Polyneuropathy, unspecified: Secondary | ICD-10-CM | POA: Diagnosis not present

## 2018-06-01 ENCOUNTER — Telehealth: Payer: Self-pay

## 2018-06-01 NOTE — Telephone Encounter (Signed)
I contacted the pt and updated meds, allergies and pmh.  Pt confirmed she has received the [email or web address] link.   Pt understands that although there may be some limitations with this type of visit, we will take all precautions to reduce any security or privacy concerns.  Pt understands that this will be treated like an in office visit and we will file with pt's insurance, and there may be a patient responsible charge related to this service.  Pt states her back pain has worsened since her last visit. Pt confirms she has had two different falls. Pt advised she was scheduled for her neurotransmitter surgery scheduled for 05/03/18 but ended up be canceled due to covid-19. Pt states she is having increase difficulty getting out of the bed in the am. Patient is followed by the pain clinic and her next appt is scheduled for 06/10/18 or 06/11/18.

## 2018-06-02 ENCOUNTER — Encounter: Payer: Self-pay | Admitting: Neurology

## 2018-06-02 ENCOUNTER — Ambulatory Visit (INDEPENDENT_AMBULATORY_CARE_PROVIDER_SITE_OTHER): Payer: Medicare HMO | Admitting: Neurology

## 2018-06-02 DIAGNOSIS — M545 Low back pain, unspecified: Secondary | ICD-10-CM

## 2018-06-02 DIAGNOSIS — M797 Fibromyalgia: Secondary | ICD-10-CM

## 2018-06-02 DIAGNOSIS — R51 Headache: Secondary | ICD-10-CM | POA: Diagnosis not present

## 2018-06-02 DIAGNOSIS — M5417 Radiculopathy, lumbosacral region: Secondary | ICD-10-CM

## 2018-06-02 DIAGNOSIS — G4486 Cervicogenic headache: Secondary | ICD-10-CM

## 2018-06-02 MED ORDER — TIZANIDINE HCL 2 MG PO TABS: 2.0000 mg | ORAL_TABLET | Freq: Three times a day (TID) | ORAL | 3 refills | Status: DC

## 2018-06-02 NOTE — Progress Notes (Signed)
Virtual Visit via Video Note  I connected with Ashley [name] on 06/02/18 at  9:00 AM EDT by a video enabled telemedicine application and verified that I am speaking with the correct person using two identifiers.  Location: Patient: The patient is at home. Provider: The physician is in office.   I discussed the limitations of evaluation and management by telemedicine and the availability of in person appointments. The patient expressed understanding and agreed to proceed.  History of Present Illness: Ashley [name] is a 53 year old right-handed white female with a history of fibromyalgia and chronic neck and low back pain.  The patient has had surgery at the C5-6 and C6-7 levels in the neck, she has chronic neck pain, particular on the left with left sided cervicogenic headache.  The patient is on gabapentin and high-dose without significant benefit.  She takes hydrocodone 7.5 mg, twice daily.  She is followed through the preferred pain center.  She continues to have low back pain with pain down the left leg tickly to the ankle.  EMG and nerve conduction study showed evidence of a chronic S1 radiculopathy on the left.  The patient does have scarring at the L4-5 and L5-S1 level that may be the source of her pain.  She has chronic fatigue and daytime drowsiness, she has sleep apnea, on CPAP but she has not been able to use her CPAP, her insurance will not cover the equipment for it.  The patient is being considered for spinal stimulator trial next week.  If this is effective, the permanent stimulator will be placed.  The patient feels slightly weak on the left leg, she uses a brace on the left leg.  She is not using a cane or a walker for ambulation, she denies any recent falls.   Observations/Objective: Video evaluation reveals that the patient is alert and cooperative.  She has a normal speech pattern, no aphasia or dysarthria is noted.  Extraocular movements are full.  Face is symmetric, the  patient is able to protrude the tongue in the midline with good lateral movement of the tongue.  She has good finger-nose-finger and heel-to-shin bilaterally.  She lacks about 20 degrees of full lateral rotation of the cervical spine bilaterally.  She is able to flex fairly well with the low back but claims to have pain when she comes back up.  She has a slight limping gait on the left leg, she is able to walk independently.  Tandem gait is slightly unsteady.  Romberg is negative, no drift is seen.  Assessment and Plan: 1.  Chronic neck pain  2.  Cervicogenic headache  3.  Chronic low back pain, chronic left S1 radiculopathy  4.  Fibromyalgia  The patient is being followed through a pain center, she will have a spinal stimulator trial next week.  She will be placed on tizanidine 2 mg 3 times daily.  She will continue the gabapentin.  She will follow-up through this office in 6 months.  She will call for any dose adjustments of the tizanidine.  Follow Up Instructions: 23-month follow-up, may see nurse practitioner.   I discussed the assessment and treatment plan with the patient. The patient was provided an opportunity to ask questions and all were answered. The patient agreed with the plan and demonstrated an understanding of the instructions.   The patient was advised to call back or seek an in-person evaluation if the symptoms worsen or if the condition fails to improve as  anticipated.  I provided 22 minutes of non-face-to-face time during this encounter.   Kathrynn Ducking, MD

## 2018-06-10 DIAGNOSIS — M533 Sacrococcygeal disorders, not elsewhere classified: Secondary | ICD-10-CM | POA: Diagnosis not present

## 2018-06-10 DIAGNOSIS — M961 Postlaminectomy syndrome, not elsewhere classified: Secondary | ICD-10-CM | POA: Diagnosis not present

## 2018-06-10 DIAGNOSIS — M5416 Radiculopathy, lumbar region: Secondary | ICD-10-CM | POA: Diagnosis not present

## 2018-06-15 ENCOUNTER — Encounter: Payer: Self-pay | Admitting: Neurology

## 2018-06-15 ENCOUNTER — Telehealth: Payer: Self-pay | Admitting: Neurology

## 2018-06-15 MED ORDER — TIZANIDINE HCL 4 MG PO TABS: 4.0000 mg | ORAL_TABLET | Freq: Three times a day (TID) | ORAL | 3 refills | Status: DC

## 2018-06-15 MED ORDER — PREDNISONE 10 MG PO TABS: ORAL_TABLET | ORAL | 0 refills | Status: DC

## 2018-06-15 NOTE — Telephone Encounter (Signed)
I called the patient.  The patient has had a recent increase in her back pain now on the right side of the back with a burning sensation, she is having some pain into the right groin and some tingling into her right great toe.  This started 5 days ago.  She still has the usual left-sided pain.  The spinal stimulator has been delayed until 29 June 2018.  I will send in a prescription for 6-day course of prednisone, and increase her tizanidine to 4 mg 3 times daily.

## 2018-06-15 NOTE — Addendum Note (Signed)
Addended by: Kathrynn Ducking on: 06/15/2018 04:43 PM   Modules accepted: Orders

## 2018-06-15 NOTE — Telephone Encounter (Signed)
Patient calling in and she states she is having a lot more pain in right lower back she is also having burning in right lower buttock and her groin area is burring on the right.  Patient pain level is 8 . [phone] .

## 2018-07-12 DIAGNOSIS — M961 Postlaminectomy syndrome, not elsewhere classified: Secondary | ICD-10-CM | POA: Diagnosis not present

## 2018-07-12 DIAGNOSIS — M5416 Radiculopathy, lumbar region: Secondary | ICD-10-CM | POA: Diagnosis not present

## 2018-07-27 DIAGNOSIS — R739 Hyperglycemia, unspecified: Secondary | ICD-10-CM | POA: Diagnosis not present

## 2018-07-27 DIAGNOSIS — E8881 Metabolic syndrome: Secondary | ICD-10-CM | POA: Diagnosis not present

## 2018-07-27 DIAGNOSIS — K210 Gastro-esophageal reflux disease with esophagitis: Secondary | ICD-10-CM | POA: Diagnosis not present

## 2018-07-27 DIAGNOSIS — F331 Major depressive disorder, recurrent, moderate: Secondary | ICD-10-CM | POA: Diagnosis not present

## 2018-07-27 DIAGNOSIS — I10 Essential (primary) hypertension: Secondary | ICD-10-CM | POA: Diagnosis not present

## 2018-07-27 DIAGNOSIS — R7301 Impaired fasting glucose: Secondary | ICD-10-CM | POA: Diagnosis not present

## 2018-08-04 DIAGNOSIS — F411 Generalized anxiety disorder: Secondary | ICD-10-CM | POA: Diagnosis not present

## 2018-08-04 DIAGNOSIS — G43809 Other migraine, not intractable, without status migrainosus: Secondary | ICD-10-CM | POA: Diagnosis not present

## 2018-08-04 DIAGNOSIS — Z1389 Encounter for screening for other disorder: Secondary | ICD-10-CM | POA: Diagnosis not present

## 2018-08-04 DIAGNOSIS — M545 Low back pain: Secondary | ICD-10-CM | POA: Diagnosis not present

## 2018-08-04 DIAGNOSIS — M546 Pain in thoracic spine: Secondary | ICD-10-CM | POA: Diagnosis not present

## 2018-08-04 DIAGNOSIS — Z6830 Body mass index (BMI) 30.0-30.9, adult: Secondary | ICD-10-CM | POA: Diagnosis not present

## 2018-08-04 DIAGNOSIS — R7301 Impaired fasting glucose: Secondary | ICD-10-CM | POA: Diagnosis not present

## 2018-08-04 DIAGNOSIS — F3341 Major depressive disorder, recurrent, in partial remission: Secondary | ICD-10-CM | POA: Diagnosis not present

## 2018-08-05 DIAGNOSIS — F419 Anxiety disorder, unspecified: Secondary | ICD-10-CM | POA: Diagnosis not present

## 2018-08-05 DIAGNOSIS — Z6830 Body mass index (BMI) 30.0-30.9, adult: Secondary | ICD-10-CM | POA: Diagnosis not present

## 2018-08-05 DIAGNOSIS — N816 Rectocele: Secondary | ICD-10-CM | POA: Diagnosis not present

## 2018-08-16 DIAGNOSIS — M961 Postlaminectomy syndrome, not elsewhere classified: Secondary | ICD-10-CM | POA: Diagnosis not present

## 2018-08-16 DIAGNOSIS — M5416 Radiculopathy, lumbar region: Secondary | ICD-10-CM | POA: Diagnosis not present

## 2018-08-16 DIAGNOSIS — M533 Sacrococcygeal disorders, not elsewhere classified: Secondary | ICD-10-CM | POA: Diagnosis not present

## 2018-08-26 ENCOUNTER — Other Ambulatory Visit: Payer: Self-pay

## 2018-08-26 MED ORDER — TIZANIDINE HCL 4 MG PO TABS: 4.0000 mg | ORAL_TABLET | Freq: Three times a day (TID) | ORAL | 1 refills | Status: DC

## 2018-08-28 HISTORY — PX: SPINAL CORD STIMULATOR INSERTION: SHX5378

## 2018-09-01 DIAGNOSIS — N816 Rectocele: Secondary | ICD-10-CM | POA: Diagnosis not present

## 2018-09-01 DIAGNOSIS — N3941 Urge incontinence: Secondary | ICD-10-CM | POA: Diagnosis not present

## 2018-09-15 DIAGNOSIS — Z20828 Contact with and (suspected) exposure to other viral communicable diseases: Secondary | ICD-10-CM | POA: Diagnosis not present

## 2018-09-22 DIAGNOSIS — G8929 Other chronic pain: Secondary | ICD-10-CM | POA: Diagnosis not present

## 2018-09-22 DIAGNOSIS — M961 Postlaminectomy syndrome, not elsewhere classified: Secondary | ICD-10-CM | POA: Diagnosis not present

## 2018-09-22 DIAGNOSIS — M5417 Radiculopathy, lumbosacral region: Secondary | ICD-10-CM | POA: Diagnosis not present

## 2018-09-29 DIAGNOSIS — F419 Anxiety disorder, unspecified: Secondary | ICD-10-CM | POA: Diagnosis not present

## 2018-09-29 DIAGNOSIS — R32 Unspecified urinary incontinence: Secondary | ICD-10-CM | POA: Diagnosis not present

## 2018-09-29 DIAGNOSIS — M545 Low back pain: Secondary | ICD-10-CM | POA: Diagnosis not present

## 2018-09-29 DIAGNOSIS — Z6832 Body mass index (BMI) 32.0-32.9, adult: Secondary | ICD-10-CM | POA: Diagnosis not present

## 2018-10-05 DIAGNOSIS — G894 Chronic pain syndrome: Secondary | ICD-10-CM | POA: Diagnosis not present

## 2018-10-05 DIAGNOSIS — M961 Postlaminectomy syndrome, not elsewhere classified: Secondary | ICD-10-CM | POA: Diagnosis not present

## 2018-11-01 DIAGNOSIS — M961 Postlaminectomy syndrome, not elsewhere classified: Secondary | ICD-10-CM | POA: Diagnosis not present

## 2018-11-01 DIAGNOSIS — M5416 Radiculopathy, lumbar region: Secondary | ICD-10-CM | POA: Diagnosis not present

## 2018-11-01 DIAGNOSIS — M25572 Pain in left ankle and joints of left foot: Secondary | ICD-10-CM | POA: Diagnosis not present

## 2018-11-30 DIAGNOSIS — I10 Essential (primary) hypertension: Secondary | ICD-10-CM | POA: Diagnosis not present

## 2018-11-30 DIAGNOSIS — R739 Hyperglycemia, unspecified: Secondary | ICD-10-CM | POA: Diagnosis not present

## 2018-11-30 DIAGNOSIS — E8881 Metabolic syndrome: Secondary | ICD-10-CM | POA: Diagnosis not present

## 2018-11-30 DIAGNOSIS — R7301 Impaired fasting glucose: Secondary | ICD-10-CM | POA: Diagnosis not present

## 2018-12-03 ENCOUNTER — Ambulatory Visit: Payer: Medicare HMO | Admitting: Neurology

## 2018-12-06 DIAGNOSIS — R7301 Impaired fasting glucose: Secondary | ICD-10-CM | POA: Diagnosis not present

## 2018-12-06 DIAGNOSIS — Z23 Encounter for immunization: Secondary | ICD-10-CM | POA: Diagnosis not present

## 2018-12-06 DIAGNOSIS — G43809 Other migraine, not intractable, without status migrainosus: Secondary | ICD-10-CM | POA: Diagnosis not present

## 2018-12-06 DIAGNOSIS — Z6831 Body mass index (BMI) 31.0-31.9, adult: Secondary | ICD-10-CM | POA: Diagnosis not present

## 2018-12-06 DIAGNOSIS — M546 Pain in thoracic spine: Secondary | ICD-10-CM | POA: Diagnosis not present

## 2018-12-06 DIAGNOSIS — F411 Generalized anxiety disorder: Secondary | ICD-10-CM | POA: Diagnosis not present

## 2018-12-06 DIAGNOSIS — E782 Mixed hyperlipidemia: Secondary | ICD-10-CM | POA: Diagnosis not present

## 2018-12-06 DIAGNOSIS — F3341 Major depressive disorder, recurrent, in partial remission: Secondary | ICD-10-CM | POA: Diagnosis not present
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
## 2018-12-09 ENCOUNTER — Ambulatory Visit: Payer: Medicare HMO | Admitting: Neurology

## 2018-12-14 DIAGNOSIS — Z111 Encounter for screening for respiratory tuberculosis: Secondary | ICD-10-CM | POA: Diagnosis not present
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
## 2018-12-22 ENCOUNTER — Ambulatory Visit: Payer: Medicare HMO | Admitting: Neurology

## 2018-12-22 ENCOUNTER — Encounter: Payer: Self-pay | Admitting: Neurology

## 2018-12-22 ENCOUNTER — Other Ambulatory Visit: Payer: Self-pay

## 2018-12-22 VITALS — BP 132/82 | HR 88 | Temp 97.4°F | Ht 64.0 in | Wt 193.2 lb

## 2018-12-22 DIAGNOSIS — M545 Low back pain, unspecified: Secondary | ICD-10-CM

## 2018-12-22 DIAGNOSIS — R0683 Snoring: Secondary | ICD-10-CM

## 2018-12-22 NOTE — Patient Instructions (Signed)
I will place an order for Ashley [name][facility] to assist in getting new tubing, but there may be an associated cost. I will ask that once you resume CPAP, please schedule a 3 month follow-up at our office.   Please call when you need tizanidine  I am very sorry for your loss. I will be thinking of you.

## 2018-12-22 NOTE — Progress Notes (Signed)
Order for cpap tubing sent to [facility] via fax. Confirmation received that the order transmitted was successful.

## 2018-12-22 NOTE — Progress Notes (Signed)
I have read the note, and I agree with the clinical assessment and plan.   K    

## 2018-12-27 DIAGNOSIS — F419 Anxiety disorder, unspecified: Secondary | ICD-10-CM | POA: Diagnosis not present

## 2018-12-27 DIAGNOSIS — I10 Essential (primary) hypertension: Secondary | ICD-10-CM | POA: Diagnosis not present

## 2019-01-05 DIAGNOSIS — M545 Low back pain: Secondary | ICD-10-CM | POA: Diagnosis not present

## 2019-01-05 DIAGNOSIS — F4321 Adjustment disorder with depressed mood: Secondary | ICD-10-CM | POA: Diagnosis not present

## 2019-01-05 DIAGNOSIS — G629 Polyneuropathy, unspecified: Secondary | ICD-10-CM | POA: Diagnosis not present

## 2019-01-05 DIAGNOSIS — F419 Anxiety disorder, unspecified: Secondary | ICD-10-CM | POA: Diagnosis not present

## 2019-01-05 DIAGNOSIS — M79605 Pain in left leg: Secondary | ICD-10-CM | POA: Diagnosis not present

## 2019-01-05 DIAGNOSIS — F330 Major depressive disorder, recurrent, mild: Secondary | ICD-10-CM | POA: Diagnosis not present

## 2019-01-05 DIAGNOSIS — K219 Gastro-esophageal reflux disease without esophagitis: Secondary | ICD-10-CM | POA: Diagnosis not present

## 2019-01-05 DIAGNOSIS — I10 Essential (primary) hypertension: Secondary | ICD-10-CM | POA: Diagnosis not present

## 2019-01-12 DIAGNOSIS — G894 Chronic pain syndrome: Secondary | ICD-10-CM | POA: Diagnosis not present

## 2019-01-12 DIAGNOSIS — M533 Sacrococcygeal disorders, not elsewhere classified: Secondary | ICD-10-CM | POA: Diagnosis not present

## 2019-01-12 DIAGNOSIS — M961 Postlaminectomy syndrome, not elsewhere classified: Secondary | ICD-10-CM | POA: Diagnosis not present

## 2019-01-12 DIAGNOSIS — M5416 Radiculopathy, lumbar region: Secondary | ICD-10-CM | POA: Diagnosis not present

## 2019-01-27 DIAGNOSIS — I10 Essential (primary) hypertension: Secondary | ICD-10-CM | POA: Diagnosis not present

## 2019-01-27 DIAGNOSIS — R7301 Impaired fasting glucose: Secondary | ICD-10-CM | POA: Diagnosis not present

## 2019-02-16 DIAGNOSIS — M533 Sacrococcygeal disorders, not elsewhere classified: Secondary | ICD-10-CM | POA: Diagnosis not present

## 2019-02-25 DIAGNOSIS — I10 Essential (primary) hypertension: Secondary | ICD-10-CM | POA: Diagnosis not present

## 2019-02-25 DIAGNOSIS — F331 Major depressive disorder, recurrent, moderate: Secondary | ICD-10-CM | POA: Diagnosis not present

## 2019-04-07 DIAGNOSIS — G894 Chronic pain syndrome: Secondary | ICD-10-CM | POA: Diagnosis not present

## 2019-04-07 DIAGNOSIS — M961 Postlaminectomy syndrome, not elsewhere classified: Secondary | ICD-10-CM | POA: Diagnosis not present

## 2019-04-07 DIAGNOSIS — M533 Sacrococcygeal disorders, not elsewhere classified: Secondary | ICD-10-CM | POA: Diagnosis not present

## 2019-04-07 DIAGNOSIS — M5416 Radiculopathy, lumbar region: Secondary | ICD-10-CM | POA: Diagnosis not present

## 2019-04-11 DIAGNOSIS — R739 Hyperglycemia, unspecified: Secondary | ICD-10-CM | POA: Diagnosis not present

## 2019-04-11 DIAGNOSIS — E782 Mixed hyperlipidemia: Secondary | ICD-10-CM | POA: Diagnosis not present

## 2019-04-11 DIAGNOSIS — I10 Essential (primary) hypertension: Secondary | ICD-10-CM | POA: Diagnosis not present

## 2019-04-12 DIAGNOSIS — G894 Chronic pain syndrome: Secondary | ICD-10-CM | POA: Diagnosis not present

## 2019-04-14 DIAGNOSIS — F411 Generalized anxiety disorder: Secondary | ICD-10-CM | POA: Diagnosis not present

## 2019-04-14 DIAGNOSIS — G43909 Migraine, unspecified, not intractable, without status migrainosus: Secondary | ICD-10-CM | POA: Diagnosis not present

## 2019-04-14 DIAGNOSIS — G43809 Other migraine, not intractable, without status migrainosus: Secondary | ICD-10-CM | POA: Diagnosis not present

## 2019-04-14 DIAGNOSIS — M545 Low back pain: Secondary | ICD-10-CM | POA: Diagnosis not present

## 2019-04-14 DIAGNOSIS — E782 Mixed hyperlipidemia: Secondary | ICD-10-CM | POA: Diagnosis not present

## 2019-04-14 DIAGNOSIS — F3341 Major depressive disorder, recurrent, in partial remission: Secondary | ICD-10-CM | POA: Diagnosis not present

## 2019-04-14 DIAGNOSIS — Z6832 Body mass index (BMI) 32.0-32.9, adult: Secondary | ICD-10-CM | POA: Diagnosis not present

## 2019-04-14 DIAGNOSIS — R7301 Impaired fasting glucose: Secondary | ICD-10-CM | POA: Diagnosis not present

## 2019-05-23 DIAGNOSIS — F332 Major depressive disorder, recurrent severe without psychotic features: Secondary | ICD-10-CM | POA: Diagnosis not present

## 2019-05-30 DIAGNOSIS — F332 Major depressive disorder, recurrent severe without psychotic features: Secondary | ICD-10-CM | POA: Diagnosis not present

## 2019-06-06 DIAGNOSIS — F332 Major depressive disorder, recurrent severe without psychotic features: Secondary | ICD-10-CM | POA: Diagnosis not present

## 2019-06-14 DIAGNOSIS — F411 Generalized anxiety disorder: Secondary | ICD-10-CM | POA: Diagnosis not present

## 2019-06-14 DIAGNOSIS — Z6832 Body mass index (BMI) 32.0-32.9, adult: Secondary | ICD-10-CM | POA: Diagnosis not present

## 2019-06-20 DIAGNOSIS — F332 Major depressive disorder, recurrent severe without psychotic features: Secondary | ICD-10-CM | POA: Diagnosis not present

## 2019-06-21 DIAGNOSIS — H52209 Unspecified astigmatism, unspecified eye: Secondary | ICD-10-CM | POA: Diagnosis not present

## 2019-06-21 DIAGNOSIS — H524 Presbyopia: Secondary | ICD-10-CM | POA: Diagnosis not present

## 2019-06-21 DIAGNOSIS — H5213 Myopia, bilateral: Secondary | ICD-10-CM | POA: Diagnosis not present

## 2019-06-22 ENCOUNTER — Ambulatory Visit: Payer: Medicare HMO | Admitting: Neurology

## 2019-07-19 ENCOUNTER — Encounter: Payer: Self-pay | Admitting: Neurology

## 2019-07-19 ENCOUNTER — Telehealth: Payer: Self-pay | Admitting: Neurology

## 2019-07-19 ENCOUNTER — Ambulatory Visit (INDEPENDENT_AMBULATORY_CARE_PROVIDER_SITE_OTHER): Payer: Medicare Other | Admitting: Neurology

## 2019-07-19 VITALS — BP 151/95 | HR 68 | Ht 64.0 in | Wt 191.0 lb

## 2019-07-19 DIAGNOSIS — M545 Low back pain, unspecified: Secondary | ICD-10-CM

## 2019-07-19 DIAGNOSIS — M797 Fibromyalgia: Secondary | ICD-10-CM

## 2019-07-19 DIAGNOSIS — G4486 Cervicogenic headache: Secondary | ICD-10-CM

## 2019-07-19 DIAGNOSIS — R519 Headache, unspecified: Secondary | ICD-10-CM | POA: Diagnosis not present

## 2019-07-19 MED ORDER — TIZANIDINE HCL 4 MG PO TABS: 4.0000 mg | ORAL_TABLET | Freq: Two times a day (BID) | ORAL | 1 refills | Status: DC

## 2019-07-19 NOTE — Telephone Encounter (Signed)
Attempted to call the patient mobile number that is listed on file and the number states not in service. I will send the patient a mychart message asking her to call our office.
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
## 2019-07-19 NOTE — Patient Instructions (Signed)
We will decrease tizanidine to 4 mg twice daily  Continue current medications  I will have our sleep nurse reach out to you about your CPAP machine See you back in 8 months

## 2019-07-19 NOTE — Telephone Encounter (Signed)
Could you kindly call this patient, she has history of OSA, on CPAP, is not using her machine, says needs fixing, but she can't afford. Also issue of switching insurances, could you provide her some guidance, any options for her?

## 2019-07-19 NOTE — Progress Notes (Signed)
I have read the note, and I agree with the clinical assessment and plan.   K    

## 2019-07-21 DIAGNOSIS — Z79891 Long term (current) use of opiate analgesic: Secondary | ICD-10-CM | POA: Diagnosis not present

## 2019-07-21 DIAGNOSIS — G894 Chronic pain syndrome: Secondary | ICD-10-CM | POA: Diagnosis not present

## 2019-07-21 DIAGNOSIS — Z79899 Other long term (current) drug therapy: Secondary | ICD-10-CM | POA: Diagnosis not present

## 2019-07-21 DIAGNOSIS — M961 Postlaminectomy syndrome, not elsewhere classified: Secondary | ICD-10-CM | POA: Diagnosis not present

## 2019-07-21 DIAGNOSIS — M545 Low back pain: Secondary | ICD-10-CM | POA: Diagnosis not present

## 2019-08-04 ENCOUNTER — Encounter: Payer: Self-pay | Admitting: Internal Medicine

## 2019-08-30 DIAGNOSIS — Z6832 Body mass index (BMI) 32.0-32.9, adult: Secondary | ICD-10-CM | POA: Diagnosis not present

## 2019-08-30 DIAGNOSIS — R7303 Prediabetes: Secondary | ICD-10-CM | POA: Diagnosis not present

## 2019-08-30 DIAGNOSIS — E782 Mixed hyperlipidemia: Secondary | ICD-10-CM | POA: Diagnosis not present

## 2019-08-30 DIAGNOSIS — Z0001 Encounter for general adult medical examination with abnormal findings: Secondary | ICD-10-CM | POA: Diagnosis not present

## 2019-08-30 DIAGNOSIS — F411 Generalized anxiety disorder: Secondary | ICD-10-CM | POA: Diagnosis not present

## 2019-08-30 DIAGNOSIS — Z23 Encounter for immunization: Secondary | ICD-10-CM | POA: Diagnosis not present

## 2019-08-30 DIAGNOSIS — R05 Cough: Secondary | ICD-10-CM | POA: Diagnosis not present

## 2019-08-30 DIAGNOSIS — F3341 Major depressive disorder, recurrent, in partial remission: Secondary | ICD-10-CM | POA: Diagnosis not present

## 2019-09-06 ENCOUNTER — Telehealth: Payer: Self-pay | Admitting: *Deleted

## 2019-09-06 ENCOUNTER — Other Ambulatory Visit: Payer: Self-pay

## 2019-09-06 ENCOUNTER — Ambulatory Visit (INDEPENDENT_AMBULATORY_CARE_PROVIDER_SITE_OTHER): Payer: Medicare PPO | Admitting: Internal Medicine

## 2019-09-06 ENCOUNTER — Encounter: Payer: Self-pay | Admitting: Internal Medicine

## 2019-09-06 VITALS — BP 152/107 | HR 90 | Temp 97.3°F | Ht 65.0 in | Wt 195.8 lb

## 2019-09-06 DIAGNOSIS — R1319 Other dysphagia: Secondary | ICD-10-CM

## 2019-09-06 DIAGNOSIS — R1310 Dysphagia, unspecified: Secondary | ICD-10-CM

## 2019-09-06 NOTE — Telephone Encounter (Signed)
LMOVM for pt to schedule EGD/ed with propofol, Dr. Gala Romney, ASA 3
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
## 2019-09-06 NOTE — Patient Instructions (Signed)
Schedule an EGD with esophageal dilation - GERD and Dysphagia - propofol (clear liquid for lunch and supper day before procedure)  Continue Protonix daily  As discussed, will put off surveillance colonoscopy until 2024  Further recommendations to follow

## 2019-09-07 ENCOUNTER — Encounter: Payer: Self-pay | Admitting: *Deleted

## 2019-09-07 NOTE — Telephone Encounter (Signed)
CALLED PT. She is scheduled for procedure 9/13 at 2:45pm. Patient aware will need pre-op/covid test prior. Advised will mail instructions with pre-op/covid.    PA done via [employer]. Procedure Tracking # [id number]

## 2019-09-08 NOTE — Telephone Encounter (Signed)
PA approved. Auth# [id number] dates 10/10/19-11/09/19

## 2019-10-04 NOTE — Patient Instructions (Signed)
Ashley [name]  10/04/2019     @PREFPERIOPPHARMACY @   Your procedure is scheduled on  10/10/2019.  Report to [name] at 1300 (1:00)  P.M.  Call this number if you have problems the morning of surgery:  [phone]   Remember:  Follow the diet instructions given to you by the office.                       Take these medicines the morning of surgery with A SIP OF WATER  Buspar, gabapentin, hydrocodone(if needed), protonix, phenergan(if needed), zoloft, zanaflex(if needed), verapamil.    Do not wear jewelry, make-up or nail polish.  Do not wear lotions, powders, or perfumes, or deodorant. Please wear deodorant and brush your teeth.  Do not shave 48 hours prior to surgery.  Men may shave face and neck.  Do not bring valuables to the hospital.  [facility] is not responsible for any belongings or valuables.  Contacts, dentures or bridgework may not be worn into surgery.  Leave your suitcase in the car.  After surgery it may be brought to your room.  For patients admitted to the hospital, discharge time will be determined by your treatment team.  Patients discharged the day of surgery will not be allowed to drive home.   Name and phone number of your driver:   family Special instructions:   DO NOT smoke the morning of your procedure. Please read over the following fact sheets that you were given. Anesthesia Post-op Instructions and Care and Recovery After Surgery       Upper Endoscopy, Adult, Care After This sheet gives you information about how to care for yourself after your procedure. Your health care provider may also give you more specific instructions. If you have problems or questions, contact your health care provider. What can I expect after the procedure? After the procedure, it is common to have:  A sore throat.  Mild stomach pain or discomfort.  Bloating.  Nausea. Follow these instructions at home:   Follow instructions from your health care  provider about what to eat or drink after your procedure.  Return to your normal activities as told by your health care provider. Ask your health care provider what activities are safe for you.  Take over-the-counter and prescription medicines only as told by your health care provider.  Do not drive for 24 hours if you were given a sedative during your procedure.  Keep all follow-up visits as told by your health care provider. This is important. Contact a health care provider if you have:  A sore throat that lasts longer than one day.  Trouble swallowing. Get help right away if:  You vomit blood or your vomit looks like coffee grounds.  You have: ? A fever. ? Bloody, black, or tarry stools. ? A severe sore throat or you cannot swallow. ? Difficulty breathing. ? Severe pain in your chest or abdomen. Summary  After the procedure, it is common to have a sore throat, mild stomach discomfort, bloating, and nausea.  Do not drive for 24 hours if you were given a sedative during the procedure.  Follow instructions from your health care provider about what to eat or drink after your procedure.  Return to your normal activities as told by your health care provider. This information is not intended to replace advice given to you by your health care provider. Make sure you discuss any questions you  have with your health care provider. Document Revised: 07/07/2017 Document Reviewed: 06/15/2017 Elsevier Patient Education  2020 [place] After These instructions provide you with information about caring for yourself after your procedure. Your health care provider may also give you more specific instructions. Your treatment has been planned according to current medical practices, but problems sometimes occur. Call your health care provider if you have any problems or questions after your procedure. What can I expect after the procedure? After your  procedure, you may:  Feel sleepy for several hours.  Feel clumsy and have poor balance for several hours.  Feel forgetful about what happened after the procedure.  Have poor judgment for several hours.  Feel nauseous or vomit.  Have a sore throat if you had a breathing tube during the procedure. Follow these instructions at home: For at least 24 hours after the procedure:      Have a responsible adult stay with you. It is important to have someone help care for you until you are awake and alert.  Rest as needed.  Do not: ? Participate in activities in which you could fall or become injured. ? Drive. ? Use heavy machinery. ? Drink alcohol. ? Take sleeping pills or medicines that cause drowsiness. ? Make important decisions or sign legal documents. ? Take care of children on your own. Eating and drinking  Follow the diet that is recommended by your health care provider.  If you vomit, drink water, juice, or soup when you can drink without vomiting.  Make sure you have little or no nausea before eating solid foods. General instructions  Take over-the-counter and prescription medicines only as told by your health care provider.  If you have sleep apnea, surgery and certain medicines can increase your risk for breathing problems. Follow instructions from your health care provider about wearing your sleep device: ? Anytime you are sleeping, including during daytime naps. ? While taking prescription pain medicines, sleeping medicines, or medicines that make you drowsy.  If you smoke, do not smoke without supervision.  Keep all follow-up visits as told by your health care provider. This is important. Contact a health care provider if:  You keep feeling nauseous or you keep vomiting.  You feel light-headed.  You develop a rash.  You have a fever. Get help right away if:  You have trouble breathing. Summary  For several hours after your procedure, you may feel  sleepy and have poor judgment.  Have a responsible adult stay with you for at least 24 hours or until you are awake and alert. This information is not intended to replace advice given to you by your health care provider. Make sure you discuss any questions you have with your health care provider. Document Revised: 04/13/2017 Document Reviewed: 05/06/2015 Elsevier Patient Education  [place].

## 2019-10-06 ENCOUNTER — Other Ambulatory Visit: Payer: Self-pay

## 2019-10-06 ENCOUNTER — Other Ambulatory Visit (HOSPITAL_COMMUNITY)
Admission: RE | Admit: 2019-10-06 | Discharge: 2019-10-06 | Disposition: A | Discharge status: Home / Self Care | Payer: Medicare PPO | Source: Ambulatory Visit | Attending: Internal Medicine | Admitting: Internal Medicine

## 2019-10-06 ENCOUNTER — Encounter (HOSPITAL_COMMUNITY)
Admission: RE | Admit: 2019-10-06 | Discharge: 2019-10-06 | Disposition: A | Discharge status: Home / Self Care | Payer: Medicare PPO | Source: Ambulatory Visit | Attending: Internal Medicine | Admitting: Internal Medicine

## 2019-10-06 ENCOUNTER — Encounter (HOSPITAL_COMMUNITY): Payer: Self-pay

## 2019-10-06 DIAGNOSIS — Z01818 Encounter for other preprocedural examination: Secondary | ICD-10-CM | POA: Insufficient documentation

## 2019-10-06 DIAGNOSIS — Z20822 Contact with and (suspected) exposure to covid-19: Secondary | ICD-10-CM | POA: Diagnosis not present

## 2019-10-07 LAB — SARS CORONAVIRUS 2 (TAT 6-24 HRS): SARS Coronavirus 2: NEGATIVE

## 2019-10-10 ENCOUNTER — Other Ambulatory Visit: Payer: Self-pay

## 2019-10-10 ENCOUNTER — Ambulatory Visit (HOSPITAL_COMMUNITY): Payer: Medicare PPO | Admitting: Anesthesiology

## 2019-10-10 ENCOUNTER — Encounter (HOSPITAL_COMMUNITY): Payer: Self-pay | Admitting: Internal Medicine

## 2019-10-10 ENCOUNTER — Encounter (HOSPITAL_COMMUNITY)
Admission: RE | Disposition: A | Discharge status: Home / Self Care | Payer: Self-pay | Source: Home / Self Care | Attending: Internal Medicine

## 2019-10-10 ENCOUNTER — Ambulatory Visit (HOSPITAL_COMMUNITY)
Admission: RE | Admit: 2019-10-10 | Discharge: 2019-10-10 | Disposition: A | Discharge status: Home / Self Care | Payer: Medicare PPO | Attending: Internal Medicine | Admitting: Internal Medicine

## 2019-10-10 DIAGNOSIS — G4733 Obstructive sleep apnea (adult) (pediatric): Secondary | ICD-10-CM | POA: Insufficient documentation

## 2019-10-10 DIAGNOSIS — Z888 Allergy status to other drugs, medicaments and biological substances status: Secondary | ICD-10-CM | POA: Insufficient documentation

## 2019-10-10 DIAGNOSIS — F329 Major depressive disorder, single episode, unspecified: Secondary | ICD-10-CM | POA: Insufficient documentation

## 2019-10-10 DIAGNOSIS — Z885 Allergy status to narcotic agent status: Secondary | ICD-10-CM | POA: Insufficient documentation

## 2019-10-10 DIAGNOSIS — Z86010 Personal history of colonic polyps: Secondary | ICD-10-CM | POA: Diagnosis not present

## 2019-10-10 DIAGNOSIS — Z79899 Other long term (current) drug therapy: Secondary | ICD-10-CM | POA: Insufficient documentation

## 2019-10-10 DIAGNOSIS — M797 Fibromyalgia: Secondary | ICD-10-CM | POA: Diagnosis not present

## 2019-10-10 DIAGNOSIS — F419 Anxiety disorder, unspecified: Secondary | ICD-10-CM | POA: Diagnosis not present

## 2019-10-10 DIAGNOSIS — Z8249 Family history of ischemic heart disease and other diseases of the circulatory system: Secondary | ICD-10-CM | POA: Diagnosis not present

## 2019-10-10 DIAGNOSIS — K259 Gastric ulcer, unspecified as acute or chronic, without hemorrhage or perforation: Secondary | ICD-10-CM | POA: Diagnosis not present

## 2019-10-10 DIAGNOSIS — Z91040 Latex allergy status: Secondary | ICD-10-CM | POA: Insufficient documentation

## 2019-10-10 DIAGNOSIS — R1314 Dysphagia, pharyngoesophageal phase: Secondary | ICD-10-CM | POA: Insufficient documentation

## 2019-10-10 DIAGNOSIS — Z882 Allergy status to sulfonamides status: Secondary | ICD-10-CM | POA: Diagnosis not present

## 2019-10-10 DIAGNOSIS — I10 Essential (primary) hypertension: Secondary | ICD-10-CM | POA: Insufficient documentation

## 2019-10-10 DIAGNOSIS — Z91030 Bee allergy status: Secondary | ICD-10-CM | POA: Insufficient documentation

## 2019-10-10 DIAGNOSIS — Z9049 Acquired absence of other specified parts of digestive tract: Secondary | ICD-10-CM | POA: Insufficient documentation

## 2019-10-10 DIAGNOSIS — E669 Obesity, unspecified: Secondary | ICD-10-CM | POA: Diagnosis not present

## 2019-10-10 DIAGNOSIS — M1990 Unspecified osteoarthritis, unspecified site: Secondary | ICD-10-CM | POA: Diagnosis not present

## 2019-10-10 DIAGNOSIS — Z8711 Personal history of peptic ulcer disease: Secondary | ICD-10-CM | POA: Diagnosis not present

## 2019-10-10 DIAGNOSIS — K219 Gastro-esophageal reflux disease without esophagitis: Secondary | ICD-10-CM | POA: Diagnosis not present

## 2019-10-10 DIAGNOSIS — F1120 Opioid dependence, uncomplicated: Secondary | ICD-10-CM | POA: Diagnosis not present

## 2019-10-10 DIAGNOSIS — G4730 Sleep apnea, unspecified: Secondary | ICD-10-CM | POA: Insufficient documentation

## 2019-10-10 DIAGNOSIS — K319 Disease of stomach and duodenum, unspecified: Secondary | ICD-10-CM | POA: Diagnosis not present

## 2019-10-10 DIAGNOSIS — R1310 Dysphagia, unspecified: Secondary | ICD-10-CM

## 2019-10-10 HISTORY — PX: ESOPHAGOGASTRODUODENOSCOPY (EGD) WITH PROPOFOL: SHX5813

## 2019-10-10 HISTORY — PX: MALONEY DILATION: SHX5535

## 2019-10-10 HISTORY — PX: BIOPSY: SHX5522

## 2019-10-10 SURGERY — ESOPHAGOGASTRODUODENOSCOPY (EGD) WITH PROPOFOL
Anesthesia: General

## 2019-10-10 MED ORDER — CHLORHEXIDINE GLUCONATE CLOTH 2 % EX PADS: 6.0000 | MEDICATED_PAD | Freq: Once | CUTANEOUS | Status: DC

## 2019-10-10 MED ORDER — LIDOCAINE 2% (20 MG/ML) 5 ML SYRINGE
INTRAMUSCULAR | Status: DC | PRN
  Administered 2019-10-10: 60 mg via INTRAVENOUS

## 2019-10-10 MED ORDER — GLYCOPYRROLATE 0.2 MG/ML IJ SOLN
0.2000 mg | Freq: Once | INTRAMUSCULAR | Status: AC
  Administered 2019-10-10: 0.2 mg via INTRAVENOUS

## 2019-10-10 MED ORDER — LIDOCAINE VISCOUS HCL 2 % MT SOLN
15.0000 mL | Freq: Once | OROMUCOSAL | Status: AC
  Administered 2019-10-10: 15 mL via OROMUCOSAL

## 2019-10-10 MED ORDER — GLYCOPYRROLATE 0.2 MG/ML IJ SOLN
INTRAMUSCULAR | Status: AC
  Filled 2019-10-10: qty 1

## 2019-10-10 MED ORDER — PROPOFOL 10 MG/ML IV BOLUS
INTRAVENOUS | Status: DC | PRN
  Administered 2019-10-10: 40 mg via INTRAVENOUS
  Administered 2019-10-10 (×2): 30 mg via INTRAVENOUS

## 2019-10-10 MED ORDER — PROPOFOL 500 MG/50ML IV EMUL
INTRAVENOUS | Status: DC | PRN
  Administered 2019-10-10: 150 ug/kg/min via INTRAVENOUS

## 2019-10-10 MED ORDER — ONDANSETRON HCL 4 MG/2ML IJ SOLN
4.0000 mg | Freq: Once | INTRAMUSCULAR | Status: AC
  Administered 2019-10-10: 4 mg via INTRAVENOUS

## 2019-10-10 MED ORDER — ONDANSETRON HCL 4 MG/2ML IJ SOLN
INTRAMUSCULAR | Status: AC
  Filled 2019-10-10: qty 2

## 2019-10-10 MED ORDER — LIDOCAINE 2% (20 MG/ML) 5 ML SYRINGE
INTRAMUSCULAR | Status: AC
  Filled 2019-10-10: qty 5

## 2019-10-10 MED ORDER — LACTATED RINGERS IV SOLN
Freq: Once | INTRAVENOUS | Status: AC
  Administered 2019-10-10: 1000 mL via INTRAVENOUS

## 2019-10-10 MED ORDER — LIDOCAINE VISCOUS HCL 2 % MT SOLN
OROMUCOSAL | Status: AC
  Filled 2019-10-10: qty 15

## 2019-10-10 MED ORDER — LACTATED RINGERS IV SOLN: INTRAVENOUS | Status: DC | PRN

## 2019-10-10 MED ORDER — STERILE WATER FOR IRRIGATION IR SOLN
Status: DC | PRN
  Administered 2019-10-10: 1.5 mL

## 2019-10-10 NOTE — Discharge Instructions (Signed)
EGD Discharge instructions Please read the instructions outlined below and refer to this sheet in the next few weeks. These discharge instructions provide you with general information on caring for yourself after you leave the hospital. Your doctor may also give you specific instructions. While your treatment has been planned according to the most current medical practices available, unavoidable complications occasionally occur. If you have any problems or questions after discharge, please call your doctor. ACTIVITY  You may resume your regular activity but move at a slower pace for the next 24 hours.   Take frequent rest periods for the next 24 hours.   Walking will help expel (get rid of) the air and reduce the bloated feeling in your abdomen.   No driving for 24 hours (because of the anesthesia (medicine) used during the test).   You may shower.   Do not sign any important legal documents or operate any machinery for 24 hours (because of the anesthesia used during the test).  NUTRITION  Drink plenty of fluids.   You may resume your normal diet.   Begin with a light meal and progress to your normal diet.   Avoid alcoholic beverages for 24 hours or as instructed by your caregiver.  MEDICATIONS  You may resume your normal medications unless your caregiver tells you otherwise.  WHAT YOU CAN EXPECT TODAY  You may experience abdominal discomfort such as a feeling of fullness or "gas" pains.  FOLLOW-UP  Your doctor will discuss the results of your test with you.  SEEK IMMEDIATE MEDICAL ATTENTION IF ANY OF THE FOLLOWING OCCUR:  Excessive nausea (feeling sick to your stomach) and/or vomiting.   Severe abdominal pain and distention (swelling).   Trouble swallowing.   Temperature over 101 F (37.8 C).   Rectal bleeding or vomiting of blood.    Your esophagus was dilated today  Continue Protonix 40 mg once daily  You have small ulcers in your stomach.  Biopsies  taken.  Further recommendations to follow pending review of pathology report  Office visit with [place] in 6 weeks  At patient request, I called Edwyna Shell at [phone] -call rolled to [place], Care After These instructions provide you with information about caring for yourself after your procedure. Your health care provider may also give you more specific instructions. Your treatment has been planned according to current medical practices, but problems sometimes occur. Call your health care provider if you have any problems or questions after your procedure. What can I expect after the procedure? After your procedure, you may:  Feel sleepy for several hours.  Feel clumsy and have poor balance for several hours.  Feel forgetful about what happened after the procedure.  Have poor judgment for several hours.  Feel nauseous or vomit.  Have a sore throat if you had a breathing tube during the procedure. Follow these instructions at home: For at least 24 hours after the procedure:      Have a responsible adult stay with you. It is important to have someone help care for you until you are awake and alert.  Rest as needed.  Do not: ? Participate in activities in which you could fall or become injured. ? Drive. ? Use heavy machinery. ? Drink alcohol. ? Take sleeping pills or medicines that cause drowsiness. ? Make important decisions or sign legal documents. ? Take care of children on your own. Eating and drinking  Follow the diet that is recommended by your health  care provider.  If you vomit, drink water, juice, or soup when you can drink without vomiting.  Make sure you have little or no nausea before eating solid foods. General instructions  Take over-the-counter and prescription medicines only as told by your health care provider.  If you have sleep apnea, surgery and certain medicines can increase your risk for breathing problems.  Follow instructions from your health care provider about wearing your sleep device: ? Anytime you are sleeping, including during daytime naps. ? While taking prescription pain medicines, sleeping medicines, or medicines that make you drowsy.  If you smoke, do not smoke without supervision.  Keep all follow-up visits as told by your health care provider. This is important. Contact a health care provider if:  You keep feeling nauseous or you keep vomiting.  You feel light-headed.  You develop a rash.  You have a fever. Get help right away if:  You have trouble breathing. Summary  For several hours after your procedure, you may feel sleepy and have poor judgment.  Have a responsible adult stay with you for at least 24 hours or until you are awake and alert. This information is not intended to replace advice given to you by your health care provider. Make sure you discuss any questions you have with your health care provider. Document Revised: 04/13/2017 Document Reviewed: 05/06/2015 Elsevier Patient Education  [place].   Upper Endoscopy, Adult, Care After This sheet gives you information about how to care for yourself after your procedure. Your health care provider may also give you more specific instructions. If you have problems or questions, contact your health care provider. What can I expect after the procedure? After the procedure, it is common to have:  A sore throat.  Mild stomach pain or discomfort.  Bloating.  Nausea. Follow these instructions at home:   Follow instructions from your health care provider about what to eat or drink after your procedure.  Return to your normal activities as told by your health care provider. Ask your health care provider what activities are safe for you.  Take over-the-counter and prescription medicines only as told by your health care provider.  Do not drive for 24 hours if you were given a sedative during your  procedure.  Keep all follow-up visits as told by your health care provider. This is important. Contact a health care provider if you have:  A sore throat that lasts longer than one day.  Trouble swallowing. Get help right away if:  You vomit blood or your vomit looks like coffee grounds.  You have: ? A fever. ? Bloody, black, or tarry stools. ? A severe sore throat or you cannot swallow. ? Difficulty breathing. ? Severe pain in your chest or abdomen. Summary  After the procedure, it is common to have a sore throat, mild stomach discomfort, bloating, and nausea.  Do not drive for 24 hours if you were given a sedative during the procedure.  Follow instructions from your health care provider about what to eat or drink after your procedure.  Return to your normal activities as told by your health care provider. This information is not intended to replace advice given to you by your health care provider. Make sure you discuss any questions you have with your health care provider. Document Revised: 07/07/2017 Document Reviewed: 06/15/2017 Elsevier Patient Education  2020 [place] After These instructions provide you with information about caring for yourself after your procedure.  Your health care provider may also give you more specific instructions. Your treatment has been planned according to current medical practices, but problems sometimes occur. Call your health care provider if you have any problems or questions after your procedure. What can I expect after the procedure? After your procedure, you may:  Feel sleepy for several hours.  Feel clumsy and have poor balance for several hours.  Feel forgetful about what happened after the procedure.  Have poor judgment for several hours.  Feel nauseous or vomit.  Have a sore throat if you had a breathing tube during the procedure. Follow these instructions at home: For at least 24 hours  after the procedure:      Have a responsible adult stay with you. It is important to have someone help care for you until you are awake and alert.  Rest as needed.  Do not: ? Participate in activities in which you could fall or become injured. ? Drive. ? Use heavy machinery. ? Drink alcohol. ? Take sleeping pills or medicines that cause drowsiness. ? Make important decisions or sign legal documents. ? Take care of children on your own. Eating and drinking  Follow the diet that is recommended by your health care provider.  If you vomit, drink water, juice, or soup when you can drink without vomiting.  Make sure you have little or no nausea before eating solid foods. General instructions  Take over-the-counter and prescription medicines only as told by your health care provider.  If you have sleep apnea, surgery and certain medicines can increase your risk for breathing problems. Follow instructions from your health care provider about wearing your sleep device: ? Anytime you are sleeping, including during daytime naps. ? While taking prescription pain medicines, sleeping medicines, or medicines that make you drowsy.  If you smoke, do not smoke without supervision.  Keep all follow-up visits as told by your health care provider. This is important. Contact a health care provider if:  You keep feeling nauseous or you keep vomiting.  You feel light-headed.  You develop a rash.  You have a fever. Get help right away if:  You have trouble breathing. Summary  For several hours after your procedure, you may feel sleepy and have poor judgment.  Have a responsible adult stay with you for at least 24 hours or until you are awake and alert. This information is not intended to replace advice given to you by your health care provider. Make sure you discuss any questions you have with your health care provider. Document Revised: 04/13/2017 Document Reviewed: 05/06/2015 Elsevier  Patient Education  [place].

## 2019-10-10 NOTE — Anesthesia Postprocedure Evaluation (Signed)
Anesthesia Post Note  Patient: Ashley [name]  Procedure(s) Performed: ESOPHAGOGASTRODUODENOSCOPY (EGD) WITH PROPOFOL (N/A ) MALONEY DILATION (N/A ) BIOPSY  Patient location during evaluation: PACU Anesthesia Type: General Level of consciousness: awake and alert and oriented Pain management: pain level controlled Vital Signs Assessment: post-procedure vital signs reviewed and stable Respiratory status: spontaneous breathing and respiratory function stable Cardiovascular status: blood pressure returned to baseline and stable Postop Assessment: no apparent nausea or vomiting Anesthetic complications: no   No complications documented.   Last Vitals:  Vitals:   10/10/19 1500 10/10/19 1513  BP: (!) 140/98 (!) 157/92  Pulse: 84 73  Resp: 18 16  Temp:  36.6 C  SpO2: 99% 100%    Last Pain:  Vitals:   10/10/19 1513  TempSrc: Oral  PainSc: 0-No pain                  C 

## 2019-10-10 NOTE — Anesthesia Preprocedure Evaluation (Signed)
Anesthesia Evaluation  Patient identified by MRN, date of birth, ID band Patient awake    Reviewed: Allergy & Precautions, NPO status , Patient's Chart, lab work & pertinent test results  History of Anesthesia Complications (+) PONV and history of anesthetic complications  Airway Mallampati: III  TM Distance: >3 FB Neck ROM: Full    Dental  (+) Dental Advisory Given, Missing, Chipped   Pulmonary sleep apnea ,    Pulmonary exam normal breath sounds clear to auscultation       Cardiovascular Exercise Tolerance: Good hypertension, Pt. on medications Normal cardiovascular exam+ dysrhythmias Supra Ventricular Tachycardia  Rhythm:Regular Rate:Normal     Neuro/Psych  Headaches, PSYCHIATRIC DISORDERS Anxiety Depression PTSD Neuromuscular disease    GI/Hepatic Neg liver ROS, PUD, GERD  Medicated,  Endo/Other  negative endocrine ROS  Renal/GU negative Renal ROS     Musculoskeletal  (+) Arthritis  (BACK PAIN), Fibromyalgia -, narcotic dependent  Abdominal   Peds  Hematology negative hematology ROS (+)   Anesthesia Other Findings   Reproductive/Obstetrics                             Anesthesia Physical Anesthesia Plan  ASA: III  Anesthesia Plan: General   Post-op Pain Management:    Induction: Intravenous  PONV Risk Score and Plan: TIVA  Airway Management Planned: Nasal Cannula and Natural Airway  Additional Equipment:   Intra-op Plan:   Post-operative Plan:   Informed Consent: I have reviewed the patients History and Physical, chart, labs and discussed the procedure including the risks, benefits and alternatives for the proposed anesthesia with the patient or authorized representative who has indicated his/her understanding and acceptance.     Dental advisory given  Plan Discussed with: CRNA and Surgeon  Anesthesia Plan Comments:         Anesthesia Quick Evaluation

## 2019-10-10 NOTE — Op Note (Signed)
[facility] Patient Name: Ashley [name] Procedure Date: 10/10/2019 2:04 PM MRN: [id number] Date of Birth: January 09, 1966 Attending MD: Norvel Richards , MD CSN: [id number] Age: 54 Admit Type: Ambulatory Procedure:                Upper GI endoscopy Indications:              Dysphagia Providers:                Norvel Richards, MD, Charlsie Quest. Theda Sers RN, RN,                            Lurline Del, RN Referring MD:              Medicines:                Propofol per Anesthesia Complications:            No immediate complications. Estimated Blood Loss:     Estimated blood loss was minimal. Procedure:                Pre-Anesthesia Assessment:                           - Prior to the procedure, a History and Physical                            was performed, and patient medications and                            allergies were reviewed. The patient's tolerance of                            previous anesthesia was also reviewed. The risks                            and benefits of the procedure and the sedation                            options and risks were discussed with the patient.                            All questions were answered, and informed consent                            was obtained. Prior Anticoagulants: The patient has                            taken no previous anticoagulant or antiplatelet                            agents. ASA Grade Assessment: III - A patient with                            severe systemic disease. After reviewing the risks  and benefits, the patient was deemed in                            satisfactory condition to undergo the procedure.                           After obtaining informed consent, the endoscope was                            passed under direct vision. Throughout the                            procedure, the patient's blood pressure, pulse, and                            oxygen saturations were  monitored continuously. The                            [id number]-H190 ([id number]) scope was introduced through the                            mouth, and advanced to the second part of duodenum.                            The upper GI endoscopy was accomplished without                            difficulty. The patient tolerated the procedure                            well. Scope In: 2:18:54 PM Scope Out: 2:28:05 PM Total Procedure Duration: 0 hours 9 minutes 11 seconds  Findings:      The examined esophagus was normal. The scope was withdrawn. Dilation was       performed with a Maloney dilator with mild resistance at 79 Fr. The       scope was withdrawn. Dilation was performed with a Maloney dilator with       mild resistance at 56 Fr. The dilation site was examined following       endoscope reinsertion and showed no change. Estimated blood loss: none.      Four non-bleeding cratered gastric ulcers with no stigmata of bleeding       were found in the gastric antrum. The largest lesion was 4 mm in largest       dimension. This was biopsied with a cold forceps for histology.       Estimated blood loss was minimal.      The duodenal bulb and second portion of the duodenum were normal. Impression:               - Normal esophagus. Dilated.                           - Non-bleeding gastric ulcers with no stigmata of  bleeding. Biopsied.                           - Normal duodenal bulb and second portion of the                            duodenum. Moderate Sedation:      Moderate (conscious) sedation was personally administered by an       anesthesia professional. The following parameters were monitored: oxygen       saturation, heart rate, blood pressure, respiratory rate, EKG, adequacy       of pulmonary ventilation, and response to care. Recommendation:           - Resume previous diet.                           - Patient has a contact number available for                             emergencies. The signs and symptoms of potential                            delayed complications were discussed with the                            patient. Return to normal activities tomorrow.                            Written discharge instructions were provided to the                            patient.                           - Return to my office in 6 weeks. Continue Protonix                            daily. Further recommendations to follow pending                            review of pathology report Procedure Code(s):        --- Professional ---                           [phone], Esophagogastroduodenoscopy, flexible,                            transoral; with biopsy, single or multiple                           43450, Dilation of esophagus, by unguided sound or                            bougie, single or multiple passes Diagnosis Code(s):        --- Professional ---  K25.9, Gastric ulcer, unspecified as acute or                            chronic, without hemorrhage or perforation                           R13.10, Dysphagia, unspecified CPT copyright 2019 [employer]. All rights reserved. The codes documented in this report are preliminary and upon coder review may  be revised to meet current compliance requirements. [name] , MD Norvel Richards, MD 10/10/2019 2:38:18 PM This report has been signed electronically. Number of Addenda: 0

## 2019-10-10 NOTE — Progress Notes (Signed)
Please excuse Ashley [name] from work.  She cannot drive, operate machinery or sign legal document until after 2:00pm on Tuesday September 14,2021.
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
## 2019-10-10 NOTE — Transfer of Care (Signed)
Immediate Anesthesia Transfer of Care Note  Patient: Ashley [name]  Procedure(s) Performed: ESOPHAGOGASTRODUODENOSCOPY (EGD) WITH PROPOFOL (N/A ) MALONEY DILATION (N/A ) BIOPSY  Patient Location: PACU  Anesthesia Type:MAC  Level of Consciousness: awake, alert  and oriented  Airway & Oxygen Therapy: Patient Spontanous Breathing and Patient connected to nasal cannula oxygen  Post-op Assessment: Report given to RN and Post -op Vital signs reviewed and stable  Post vital signs: Reviewed and stable  Last Vitals:  Vitals Value Taken Time  BP 132/89 10/10/19 1433  Temp    Pulse 96 10/10/19 1435  Resp 19 10/10/19 1435  SpO2 98 % 10/10/19 1435  Vitals shown include unvalidated device data.  Last Pain:  Vitals:   10/10/19 1415  TempSrc:   PainSc: 3       Patients Stated Pain Goal: 6 (22/97/98 9211)  Complications: No complications documented.

## 2019-10-12 ENCOUNTER — Other Ambulatory Visit: Payer: Self-pay

## 2019-10-12 LAB — SURGICAL PATHOLOGY

## 2019-10-13 ENCOUNTER — Other Ambulatory Visit: Payer: Self-pay

## 2019-10-13 ENCOUNTER — Encounter (HOSPITAL_COMMUNITY): Payer: Self-pay | Admitting: Internal Medicine

## 2019-10-13 ENCOUNTER — Telehealth: Payer: Self-pay

## 2019-10-13 ENCOUNTER — Encounter: Payer: Self-pay | Admitting: Internal Medicine

## 2019-10-13 DIAGNOSIS — Z20822 Contact with and (suspected) exposure to covid-19: Secondary | ICD-10-CM | POA: Diagnosis not present

## 2019-10-13 DIAGNOSIS — Z20828 Contact with and (suspected) exposure to other viral communicable diseases: Secondary | ICD-10-CM | POA: Diagnosis not present

## 2019-10-13 DIAGNOSIS — Z6832 Body mass index (BMI) 32.0-32.9, adult: Secondary | ICD-10-CM | POA: Diagnosis not present

## 2019-10-13 MED ORDER — PANTOPRAZOLE SODIUM 40 MG PO TBEC: 40.0000 mg | DELAYED_RELEASE_TABLET | Freq: Two times a day (BID) | ORAL | 3 refills | Status: DC

## 2019-10-13 NOTE — Telephone Encounter (Signed)
Letter mailed to the pt. 

## 2019-10-13 NOTE — Telephone Encounter (Signed)
Per Dr.Rourk- Send letter to patient.  Send copy of letter with path to referring provider and PCP.   Need to increase Protonix to 40 mg twice daily. Prescription dispense 60 with 3 refills.

## 2019-10-13 NOTE — Telephone Encounter (Signed)
Noted. Medication was sent to pts pharmacy. Spoke with pt and notified pt of results, letter was mailed and medication was sent to her pharmacy.

## 2019-10-26 DIAGNOSIS — M961 Postlaminectomy syndrome, not elsewhere classified: Secondary | ICD-10-CM | POA: Diagnosis not present

## 2019-10-26 DIAGNOSIS — Z79899 Other long term (current) drug therapy: Secondary | ICD-10-CM | POA: Diagnosis not present

## 2019-10-26 DIAGNOSIS — M5416 Radiculopathy, lumbar region: Secondary | ICD-10-CM | POA: Diagnosis not present

## 2019-10-26 DIAGNOSIS — M533 Sacrococcygeal disorders, not elsewhere classified: Secondary | ICD-10-CM | POA: Diagnosis not present

## 2019-11-16 DIAGNOSIS — M533 Sacrococcygeal disorders, not elsewhere classified: Secondary | ICD-10-CM | POA: Diagnosis not present
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
## 2019-11-28 ENCOUNTER — Ambulatory Visit: Payer: Medicare PPO | Admitting: Gastroenterology

## 2019-11-28 ENCOUNTER — Encounter: Payer: Self-pay | Admitting: Gastroenterology

## 2019-11-28 ENCOUNTER — Encounter: Payer: Self-pay | Admitting: Internal Medicine

## 2019-12-07 DIAGNOSIS — Z6832 Body mass index (BMI) 32.0-32.9, adult: Secondary | ICD-10-CM | POA: Diagnosis not present

## 2019-12-07 DIAGNOSIS — I10 Essential (primary) hypertension: Secondary | ICD-10-CM | POA: Diagnosis not present

## 2019-12-07 DIAGNOSIS — K219 Gastro-esophageal reflux disease without esophagitis: Secondary | ICD-10-CM | POA: Diagnosis not present

## 2020-01-07 DIAGNOSIS — E782 Mixed hyperlipidemia: Secondary | ICD-10-CM | POA: Diagnosis not present

## 2020-01-07 DIAGNOSIS — K2100 Gastro-esophageal reflux disease with esophagitis, without bleeding: Secondary | ICD-10-CM | POA: Diagnosis not present

## 2020-01-07 DIAGNOSIS — R7303 Prediabetes: Secondary | ICD-10-CM | POA: Diagnosis not present

## 2020-01-07 DIAGNOSIS — M797 Fibromyalgia: Secondary | ICD-10-CM | POA: Diagnosis not present

## 2020-01-07 DIAGNOSIS — F3341 Major depressive disorder, recurrent, in partial remission: Secondary | ICD-10-CM | POA: Diagnosis not present

## 2020-01-07 DIAGNOSIS — F411 Generalized anxiety disorder: Secondary | ICD-10-CM | POA: Diagnosis not present

## 2020-01-07 DIAGNOSIS — Z6832 Body mass index (BMI) 32.0-32.9, adult: Secondary | ICD-10-CM | POA: Diagnosis not present

## 2020-01-25 DIAGNOSIS — M5416 Radiculopathy, lumbar region: Secondary | ICD-10-CM | POA: Diagnosis not present

## 2020-01-25 DIAGNOSIS — M961 Postlaminectomy syndrome, not elsewhere classified: Secondary | ICD-10-CM | POA: Diagnosis not present

## 2020-01-25 DIAGNOSIS — G894 Chronic pain syndrome: Secondary | ICD-10-CM | POA: Diagnosis not present

## 2020-01-26 ENCOUNTER — Other Ambulatory Visit: Payer: Self-pay | Admitting: Physician Assistant

## 2020-01-26 DIAGNOSIS — M961 Postlaminectomy syndrome, not elsewhere classified: Secondary | ICD-10-CM

## 2020-02-10 DIAGNOSIS — H66002 Acute suppurative otitis media without spontaneous rupture of ear drum, left ear: Secondary | ICD-10-CM | POA: Diagnosis not present

## 2020-02-10 DIAGNOSIS — R42 Dizziness and giddiness: Secondary | ICD-10-CM | POA: Diagnosis not present

## 2020-02-10 DIAGNOSIS — Z6831 Body mass index (BMI) 31.0-31.9, adult: Secondary | ICD-10-CM | POA: Diagnosis not present

## 2020-03-03 ENCOUNTER — Other Ambulatory Visit: Payer: Medicare PPO

## 2020-03-05 ENCOUNTER — Ambulatory Visit: Payer: Medicare HMO | Admitting: Neurology

## 2020-03-09 ENCOUNTER — Other Ambulatory Visit (HOSPITAL_COMMUNITY): Payer: Self-pay | Admitting: Physician Assistant

## 2020-03-09 ENCOUNTER — Other Ambulatory Visit: Payer: Self-pay | Admitting: Physician Assistant

## 2020-03-09 DIAGNOSIS — M961 Postlaminectomy syndrome, not elsewhere classified: Secondary | ICD-10-CM

## 2020-03-21 DIAGNOSIS — Z111 Encounter for screening for respiratory tuberculosis: Secondary | ICD-10-CM | POA: Diagnosis not present

## 2020-03-21 DIAGNOSIS — M79605 Pain in left leg: Secondary | ICD-10-CM | POA: Diagnosis not present

## 2020-03-21 DIAGNOSIS — Z6832 Body mass index (BMI) 32.0-32.9, adult: Secondary | ICD-10-CM | POA: Diagnosis not present

## 2020-03-22 ENCOUNTER — Other Ambulatory Visit: Payer: Self-pay

## 2020-03-22 ENCOUNTER — Ambulatory Visit (HOSPITAL_COMMUNITY)
Admission: RE | Admit: 2020-03-22 | Discharge: 2020-03-22 | Disposition: A | Discharge status: Home / Self Care | Payer: Medicare PPO | Source: Ambulatory Visit | Attending: Physician Assistant | Admitting: Physician Assistant

## 2020-03-22 DIAGNOSIS — M961 Postlaminectomy syndrome, not elsewhere classified: Secondary | ICD-10-CM | POA: Insufficient documentation

## 2020-03-22 DIAGNOSIS — M5116 Intervertebral disc disorders with radiculopathy, lumbar region: Secondary | ICD-10-CM | POA: Diagnosis not present

## 2020-04-25 DIAGNOSIS — M961 Postlaminectomy syndrome, not elsewhere classified: Secondary | ICD-10-CM | POA: Diagnosis not present

## 2020-04-25 DIAGNOSIS — G894 Chronic pain syndrome: Secondary | ICD-10-CM | POA: Diagnosis not present

## 2020-04-25 DIAGNOSIS — M5416 Radiculopathy, lumbar region: Secondary | ICD-10-CM | POA: Diagnosis not present

## 2020-05-15 DIAGNOSIS — M5416 Radiculopathy, lumbar region: Secondary | ICD-10-CM | POA: Diagnosis not present

## 2020-06-12 DIAGNOSIS — M961 Postlaminectomy syndrome, not elsewhere classified: Secondary | ICD-10-CM | POA: Diagnosis not present

## 2020-06-12 DIAGNOSIS — G894 Chronic pain syndrome: Secondary | ICD-10-CM | POA: Diagnosis not present

## 2020-06-12 DIAGNOSIS — M1711 Unilateral primary osteoarthritis, right knee: Secondary | ICD-10-CM | POA: Diagnosis not present

## 2020-06-12 DIAGNOSIS — Z79899 Other long term (current) drug therapy: Secondary | ICD-10-CM | POA: Diagnosis not present

## 2020-06-12 DIAGNOSIS — M25561 Pain in right knee: Secondary | ICD-10-CM | POA: Diagnosis not present

## 2020-07-04 DIAGNOSIS — Z6832 Body mass index (BMI) 32.0-32.9, adult: Secondary | ICD-10-CM | POA: Diagnosis not present

## 2020-07-04 DIAGNOSIS — L989 Disorder of the skin and subcutaneous tissue, unspecified: Secondary | ICD-10-CM | POA: Diagnosis not present

## 2020-07-10 DIAGNOSIS — R7303 Prediabetes: Secondary | ICD-10-CM | POA: Diagnosis not present

## 2020-07-10 DIAGNOSIS — E7849 Other hyperlipidemia: Secondary | ICD-10-CM | POA: Diagnosis not present

## 2020-07-10 DIAGNOSIS — E782 Mixed hyperlipidemia: Secondary | ICD-10-CM | POA: Diagnosis not present

## 2020-07-10 DIAGNOSIS — K2100 Gastro-esophageal reflux disease with esophagitis, without bleeding: Secondary | ICD-10-CM | POA: Diagnosis not present

## 2020-07-10 DIAGNOSIS — E8881 Metabolic syndrome: Secondary | ICD-10-CM | POA: Diagnosis not present

## 2020-07-13 DIAGNOSIS — E1165 Type 2 diabetes mellitus with hyperglycemia: Secondary | ICD-10-CM | POA: Diagnosis not present

## 2020-07-13 DIAGNOSIS — E7849 Other hyperlipidemia: Secondary | ICD-10-CM | POA: Diagnosis not present

## 2020-07-13 DIAGNOSIS — M797 Fibromyalgia: Secondary | ICD-10-CM | POA: Diagnosis not present

## 2020-07-13 DIAGNOSIS — Z6832 Body mass index (BMI) 32.0-32.9, adult: Secondary | ICD-10-CM | POA: Diagnosis not present

## 2020-07-13 DIAGNOSIS — K2100 Gastro-esophageal reflux disease with esophagitis, without bleeding: Secondary | ICD-10-CM | POA: Diagnosis not present

## 2020-07-13 DIAGNOSIS — F411 Generalized anxiety disorder: Secondary | ICD-10-CM | POA: Diagnosis not present

## 2020-07-13 DIAGNOSIS — F3341 Major depressive disorder, recurrent, in partial remission: Secondary | ICD-10-CM | POA: Diagnosis not present

## 2020-07-17 ENCOUNTER — Other Ambulatory Visit: Payer: Self-pay

## 2020-07-17 ENCOUNTER — Ambulatory Visit (INDEPENDENT_AMBULATORY_CARE_PROVIDER_SITE_OTHER): Payer: BC Managed Care – PPO | Admitting: Neurology

## 2020-07-17 ENCOUNTER — Encounter: Payer: Self-pay | Admitting: Neurology

## 2020-07-17 VITALS — BP 124/88 | HR 85 | Ht 65.0 in | Wt 194.0 lb

## 2020-07-17 DIAGNOSIS — G894 Chronic pain syndrome: Secondary | ICD-10-CM

## 2020-07-17 DIAGNOSIS — M25512 Pain in left shoulder: Secondary | ICD-10-CM | POA: Diagnosis not present

## 2020-07-17 DIAGNOSIS — Z6832 Body mass index (BMI) 32.0-32.9, adult: Secondary | ICD-10-CM | POA: Diagnosis not present

## 2020-07-17 DIAGNOSIS — M797 Fibromyalgia: Secondary | ICD-10-CM | POA: Diagnosis not present

## 2020-07-17 DIAGNOSIS — M25561 Pain in right knee: Secondary | ICD-10-CM | POA: Diagnosis not present

## 2020-07-17 DIAGNOSIS — M25551 Pain in right hip: Secondary | ICD-10-CM | POA: Diagnosis not present

## 2020-07-17 DIAGNOSIS — M25532 Pain in left wrist: Secondary | ICD-10-CM | POA: Diagnosis not present
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
## 2020-07-17 NOTE — Patient Instructions (Signed)
Once you start using CPAP, send me a my chart message so we can do a follow-up  Continue to see primary care, pain management  Follow-up here as needed

## 2020-07-17 NOTE — Progress Notes (Signed)
I have read the note, and I agree with the clinical assessment and plan.   K    

## 2020-07-24 DIAGNOSIS — N12 Tubulo-interstitial nephritis, not specified as acute or chronic: Secondary | ICD-10-CM | POA: Diagnosis not present

## 2020-08-23 DIAGNOSIS — M533 Sacrococcygeal disorders, not elsewhere classified: Secondary | ICD-10-CM | POA: Diagnosis not present

## 2020-08-23 DIAGNOSIS — M961 Postlaminectomy syndrome, not elsewhere classified: Secondary | ICD-10-CM | POA: Diagnosis not present

## 2020-08-23 DIAGNOSIS — G894 Chronic pain syndrome: Secondary | ICD-10-CM | POA: Diagnosis not present

## 2020-08-23 DIAGNOSIS — M5416 Radiculopathy, lumbar region: Secondary | ICD-10-CM | POA: Diagnosis not present
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
## 2021-01-03 ENCOUNTER — Encounter: Payer: Self-pay | Admitting: Neurology

## 2021-01-03 ENCOUNTER — Ambulatory Visit (INDEPENDENT_AMBULATORY_CARE_PROVIDER_SITE_OTHER): Payer: BC Managed Care – PPO | Admitting: Neurology

## 2021-01-03 DIAGNOSIS — G4733 Obstructive sleep apnea (adult) (pediatric): Secondary | ICD-10-CM | POA: Diagnosis not present

## 2021-01-03 NOTE — Patient Instructions (Signed)
I will send an order to [facility] for mask refit of your choice  Once you have been using for 2-4 weeks let me know, then can pull a download a review  If you still cannot tolerate may need to get back for sleep titration study  See you back in 3-4 months

## 2021-01-15 ENCOUNTER — Encounter: Payer: Self-pay | Admitting: Dermatology

## 2021-01-15 ENCOUNTER — Other Ambulatory Visit: Payer: Self-pay

## 2021-01-15 ENCOUNTER — Ambulatory Visit (INDEPENDENT_AMBULATORY_CARE_PROVIDER_SITE_OTHER): Payer: BC Managed Care – PPO | Admitting: Dermatology

## 2021-01-15 DIAGNOSIS — D2371 Other benign neoplasm of skin of right lower limb, including hip: Secondary | ICD-10-CM

## 2021-01-15 DIAGNOSIS — D485 Neoplasm of uncertain behavior of skin: Secondary | ICD-10-CM

## 2021-01-15 DIAGNOSIS — D239 Other benign neoplasm of skin, unspecified: Secondary | ICD-10-CM

## 2021-01-15 DIAGNOSIS — Z1283 Encounter for screening for malignant neoplasm of skin: Secondary | ICD-10-CM

## 2021-01-15 DIAGNOSIS — D2372 Other benign neoplasm of skin of left lower limb, including hip: Secondary | ICD-10-CM

## 2021-01-15 DIAGNOSIS — D2272 Melanocytic nevi of left lower limb, including hip: Secondary | ICD-10-CM

## 2021-01-15 DIAGNOSIS — Z808 Family history of malignant neoplasm of other organs or systems: Secondary | ICD-10-CM | POA: Diagnosis not present

## 2021-01-22 ENCOUNTER — Telehealth: Payer: Self-pay

## 2021-01-22 NOTE — Telephone Encounter (Signed)
-----   Message from Lavonna Monarch, MD sent at 01/17/2021  5:55 PM EST ----- Please schedule routine follow-up visit for wider shave excision (surgical time slot not necessary)

## 2021-01-22 NOTE — Telephone Encounter (Signed)
Path to patient ans surgery made with Dr Denna Haggard

## 2021-01-23 ENCOUNTER — Other Ambulatory Visit: Payer: Self-pay | Admitting: Anesthesiology

## 2021-01-23 ENCOUNTER — Other Ambulatory Visit (HOSPITAL_COMMUNITY): Payer: Self-pay | Admitting: Anesthesiology

## 2021-01-23 DIAGNOSIS — M51369 Other intervertebral disc degeneration, lumbar region without mention of lumbar back pain or lower extremity pain: Secondary | ICD-10-CM

## 2021-01-23 DIAGNOSIS — M4726 Other spondylosis with radiculopathy, lumbar region: Secondary | ICD-10-CM

## 2021-01-23 DIAGNOSIS — M5136 Other intervertebral disc degeneration, lumbar region: Secondary | ICD-10-CM

## 2021-01-24 ENCOUNTER — Other Ambulatory Visit (HOSPITAL_COMMUNITY): Payer: Self-pay | Admitting: Anesthesiology

## 2021-01-24 DIAGNOSIS — M4726 Other spondylosis with radiculopathy, lumbar region: Secondary | ICD-10-CM

## 2021-01-24 DIAGNOSIS — M5136 Other intervertebral disc degeneration, lumbar region: Secondary | ICD-10-CM

## 2021-01-30 ENCOUNTER — Other Ambulatory Visit: Payer: Self-pay

## 2021-01-30 ENCOUNTER — Ambulatory Visit (HOSPITAL_COMMUNITY)
Admission: RE | Admit: 2021-01-30 | Discharge: 2021-01-30 | Disposition: A | Discharge status: Home / Self Care | Source: Ambulatory Visit | Attending: Anesthesiology | Admitting: Anesthesiology

## 2021-01-30 DIAGNOSIS — M4726 Other spondylosis with radiculopathy, lumbar region: Secondary | ICD-10-CM | POA: Insufficient documentation

## 2021-01-30 DIAGNOSIS — M5136 Other intervertebral disc degeneration, lumbar region: Secondary | ICD-10-CM | POA: Insufficient documentation

## 2021-01-30 MED ORDER — GADOBUTROL 1 MMOL/ML IV SOLN
8.0000 mL | Freq: Once | INTRAVENOUS | Status: AC | PRN
  Administered 2021-01-30: 8 mL via INTRAVENOUS

## 2021-02-10 ENCOUNTER — Encounter: Payer: Self-pay | Admitting: Dermatology

## 2021-02-10 NOTE — Progress Notes (Signed)
° °  Isotretinoin Follow-Up Visit   Subjective  Ashley [name] is a 56 y.o. female who presents for the following: Annual Exam (Lesion on left & right thigh- ? Dermatofibroma- dad died from melanoma ).  General skin check, possible change in spot left back hip Location:  Duration:  Quality:  Associated Signs/Symptoms: Modifying Factors:  Severity:  Timing: Context:   The following portions of the chart were reviewed this encounter and updated as appropriate:  Tobacco   Allergies   Meds   Problems   Med Hx   Surg Hx   Fam Hx         Objective  Well appearing patient in no apparent distress; mood and affect are within normal limits.  A full examination was performed including scalp, head, eyes, ears, nose, lips, neck, chest, axillae, abdomen, back, buttocks, bilateral upper extremities, bilateral lower extremities, hands, feet, fingers, toes, fingernails, and toenails. All findings within normal limits unless otherwise noted below.  Areas beneath undergarments not fully examined.  General skin examination, atypical pigmented spot in on hip.  No sign of nonmelanoma skin cancer.  Pigmented spot will be biopsied.  Left Thigh - Anterior, Right Thigh - Anterior Firm pink/brown 5 mm dermal papule; typical dermoscopy        Left Hip (side) - Posterior Irregular 2 toned 9 mm macule with dermoscopic atypia in a woman with family history of melanoma      Assessment & Plan  Screening exam for skin cancer  Annual skin examination.  Patient encouraged to self examine twice annually.  Continued ultraviolet protection.  Dermatofibroma (2) Left Thigh - Anterior; Right Thigh - Anterior  Benign, ok to leave if stable. Pt will let us know what she wants to do, leave ir or take off  Neoplasm of uncertain behavior of skin Left Hip (side) - Posterior  Skin / nail biopsy Type of biopsy: tangential   Informed consent: discussed and consent obtained   Timeout: patient name, date of  birth, surgical site, and procedure verified   Anesthesia: the lesion was anesthetized in a standard fashion   Anesthetic:  1% lidocaine w/ epinephrine 1-100,000 local infiltration Instrument used: flexible razor blade   Hemostasis achieved with: ferric subsulfate   Outcome: patient tolerated procedure well   Post-procedure details: wound care instructions given    Specimen 1 - Surgical pathology Differential Diagnosis: R/O BCC vs SCC  Check Margins: No  Shave biopsy with roughly 1 mm of marginal non-pigmented skin

## 2021-02-12 ENCOUNTER — Other Ambulatory Visit: Payer: Self-pay

## 2021-02-12 ENCOUNTER — Ambulatory Visit (INDEPENDENT_AMBULATORY_CARE_PROVIDER_SITE_OTHER): Payer: BC Managed Care – PPO | Admitting: Dermatology

## 2021-02-12 DIAGNOSIS — D2272 Melanocytic nevi of left lower limb, including hip: Secondary | ICD-10-CM | POA: Diagnosis not present

## 2021-02-12 DIAGNOSIS — D229 Melanocytic nevi, unspecified: Secondary | ICD-10-CM

## 2021-03-06 ENCOUNTER — Encounter: Payer: Self-pay | Admitting: Dermatology

## 2021-03-06 NOTE — Progress Notes (Signed)
° °  Follow-Up Visit   Subjective  Ashley [name] is a 56 y.o. female who presents for the following: Procedure (Here for ws x 1 - left hip (side) posterior- severe atypia).  Severely atypical mole left hip, here for wider removal Location:  Duration:  Quality:  Associated Signs/Symptoms: Modifying Factors:  Severity:  Timing: Context:   Objective  Well appearing patient in no apparent distress; mood and affect are within normal limits. Left Hip (side) - Posterior Biopsy site identified by nurse and me.    A focused examination was performed including hip.. Relevant physical exam findings are noted in the Assessment and Plan.   Assessment & Plan    Atypical mole Left Hip (side) - Posterior  1.5 mm margins marked around biopsy site.  Wider deeper shave with light cautery of base.  Patient understands this will leave a scar and there is a small possibility that a more extensive surgery may be required.  Epidermal / dermal shaving - Left Hip (side) - Posterior  Lesion diameter (cm):  1.5 Informed consent: discussed and consent obtained   Timeout: patient name, date of birth, surgical site, and procedure verified   Procedure prep:  Patient was prepped and draped in usual sterile fashion Prep type:  Chlorhexidine Anesthesia: the lesion was anesthetized in a standard fashion   Anesthetic:  1% lidocaine w/ epinephrine 1-100,000 local infiltration Instrument used: DermaBlade   Hemostasis achieved with: aluminum chloride   Outcome: patient tolerated procedure well   Post-procedure details: sterile dressing applied and wound care instructions given   Dressing type: petrolatum gauze, petrolatum and bandage    Specimen 1 - Surgical pathology Differential Diagnosis: widershave  Severe atypia       I, Lavonna Monarch, MD, have reviewed all documentation for this visit.  The documentation on 03/06/21 for the exam, diagnosis, procedures, and orders are all accurate and  complete.

## 2021-04-18 ENCOUNTER — Ambulatory Visit: Payer: BC Managed Care – PPO | Admitting: Neurology

## 2021-05-15 ENCOUNTER — Ambulatory Visit: Payer: BC Managed Care – PPO | Admitting: Neurology

## 2021-07-22 ENCOUNTER — Other Ambulatory Visit: Payer: Self-pay

## 2021-07-22 ENCOUNTER — Emergency Department (HOSPITAL_COMMUNITY)
Admission: EM | Admit: 2021-07-22 | Discharge: 2021-07-22 | Disposition: A | Discharge status: Home / Self Care | Payer: BC Managed Care – PPO | Attending: Emergency Medicine | Admitting: Emergency Medicine

## 2021-07-22 ENCOUNTER — Encounter (HOSPITAL_COMMUNITY): Payer: Self-pay

## 2021-07-22 DIAGNOSIS — Z91040 Latex allergy status: Secondary | ICD-10-CM | POA: Diagnosis not present

## 2021-07-22 DIAGNOSIS — Z79899 Other long term (current) drug therapy: Secondary | ICD-10-CM | POA: Diagnosis not present

## 2021-07-22 DIAGNOSIS — M5442 Lumbago with sciatica, left side: Secondary | ICD-10-CM | POA: Diagnosis not present

## 2021-07-22 DIAGNOSIS — G8929 Other chronic pain: Secondary | ICD-10-CM | POA: Insufficient documentation

## 2021-07-22 DIAGNOSIS — I10 Essential (primary) hypertension: Secondary | ICD-10-CM | POA: Diagnosis not present

## 2021-07-22 DIAGNOSIS — M549 Dorsalgia, unspecified: Secondary | ICD-10-CM | POA: Diagnosis present

## 2021-07-22 MED ORDER — ONDANSETRON 4 MG PO TBDP
4.0000 mg | ORAL_TABLET | Freq: Once | ORAL | Status: AC
  Administered 2021-07-22: 4 mg via ORAL
  Filled 2021-07-22: qty 1

## 2021-07-22 MED ORDER — HYDROMORPHONE HCL 1 MG/ML IJ SOLN
1.0000 mg | Freq: Once | INTRAMUSCULAR | Status: AC
  Administered 2021-07-22: 1 mg via INTRAMUSCULAR
  Filled 2021-07-22: qty 1

## 2021-07-22 NOTE — ED Provider Notes (Signed)
[facility] EMERGENCY DEPARTMENT Provider Note   CSN: [id number] Arrival date & time: 07/22/21  3086     History  Chief Complaint  Patient presents with   Back Pain    Ashley [name] is a 56 y.o. female.   Back Pain   Patient has history of fibromyalgia, depression, PTSD, chronic back pain, hypertension, migraines, GERD, status post back surgery in 2015 and chronic back pain who presents the ED with recurrent back pain.  Patient states she has been seeing her spine doctor.  She last received an epidural injection a few months ago and had MRI earlier this year.  Patient states she has been taking all her medications but she started having increasing pain over the last several days over the weekend.  Patient states she is have a burning pain in her left buttock region and sharp pain that is rating down to her foot.  Patient tried managing with home medications but the symptoms became more severe so she came to the ED for pain relief.  She does not have any fevers or chills.  No new numbness or weakness  Home Medications Prior to Admission medications   Medication Sig Start Date End Date Taking? Authorizing Provider  calcium carbonate (TUMS EX) 750 MG chewable tablet Chew 1 tablet by mouth at bedtime as needed for heartburn.    Yes [provider]  gabapentin (NEURONTIN) 800 MG tablet TAKE (1) TABLET BY MOUTH FOUR TIMES DAILY Patient taking differently: Take 800 mg by mouth in the morning, at noon, in the evening, and at bedtime. 12/28/14  Yes Butch Penny, NP  HYDROcodone-acetaminophen (NORCO) 7.5-325 MG tablet Take 1 tablet by mouth 2 (two) times daily as needed for moderate pain or severe pain.  10/28/17  Yes [provider]  LORazepam (ATIVAN) 0.5 MG tablet Take 0.5 mg by mouth 2 (two) times daily as needed for sleep or anxiety. 06/14/19  Yes [provider]  meloxicam (MOBIC) 7.5 MG tablet Take 7.5 mg by mouth daily as needed for pain. 05/01/21  Yes [provider]  ondansetron (ZOFRAN-ODT) 4 MG disintegrating tablet Take 4 mg by mouth every 8 (eight) hours as needed for nausea or vomiting. 06/14/21  Yes [provider]  pantoprazole (PROTONIX) 40 MG tablet Take 1 tablet (40 mg total) by mouth daily. Reported on 06/27/2015 Patient taking differently: Take 40 mg by mouth daily as needed (reflux). 06/27/15  Yes Anice Paganini, NP  promethazine (PHENERGAN) 25 MG tablet Take 25 mg by mouth every 6 (six) hours as needed for nausea.    Yes [provider]  propranolol ER (INDERAL LA) 120 MG 24 hr capsule Take 120 mg by mouth daily. 05/16/21  Yes [provider]  rosuvastatin (CRESTOR) 5 MG tablet Take 5 mg by mouth every other day. 03/22/21  Yes [provider]  sertraline (ZOLOFT) 25 MG tablet Take 25 mg by mouth daily.   Yes [provider]      Allergies    Bee venom, Sulfa antibiotics, Sulfamethoxazole, Meloxicam, Tramadol, Trazodone and nefazodone, Latex, Metformin and related, and Morphine and related    Review of Systems   Review of Systems  Musculoskeletal:  Positive for back pain.    Physical Exam Updated Vital Signs BP 133/88   Pulse 95   Temp 98.1 F (36.7 C) (Oral)   Resp 17   Ht 1.651 m (5\' 5" )   Wt 86.2 kg   SpO2 93%   BMI 31.62 kg/m  Physical Exam Vitals and nursing note reviewed.  Constitutional:      General: She is not in acute distress.    Appearance: She is well-developed.  HENT:     Head: Normocephalic and atraumatic.     Right Ear: External ear normal.     Left Ear: External ear normal.  Eyes:     General: No scleral icterus.       Right eye: No discharge.        Left eye: No discharge.     Conjunctiva/sclera: Conjunctivae normal.  Neck:     Trachea: No tracheal deviation.  Cardiovascular:     Rate and Rhythm: Normal rate.  Pulmonary:     Effort: Pulmonary effort is normal. No respiratory distress.     Breath sounds: No stridor.  Abdominal:     General:  There is no distension.  Musculoskeletal:        General: Tenderness present. No swelling or deformity.     Cervical back: Neck supple.     Comments: Tenderness palpation left buttock, patient able to plantarflex and dorsiflex bilateral although feels weaker on the left side, sensation intact  Skin:    General: Skin is warm and dry.     Findings: No rash.  Neurological:     Mental Status: She is alert.     Cranial Nerves: Cranial nerve deficit: no gross deficits.     ED Results / Procedures / Treatments   Labs (all labs ordered are listed, but only abnormal results are displayed) Labs Reviewed - No data to display  EKG None  Radiology No results found.  Procedures Procedures    Medications Ordered in ED Medications  HYDROmorphone (DILAUDID) injection 1 mg (1 mg Intramuscular Given 07/22/21 0857)  ondansetron (ZOFRAN-ODT) disintegrating tablet 4 mg (4 mg Oral Given 07/22/21 0857)    ED Course/ Medical Decision Making/ A&P Clinical Course as of 07/22/21 1135  Mon Jul 22, 2021  1135 Patient given IM Dilaudid.  Patient states pain has improved.  Feels that she can be discharged at this time [JK]    Clinical Course User Index [JK] Linwood Dibbles, MD                           Medical Decision Making Risk Prescription drug management.   Patient presented to ED for evaluation of low back pain.  Patient has known history of chronic back pain.  She has been having some radicular symptoms.  She is followed by pain management.  No signs of acute infection.  No signs of any acute neurologic dysfunction.  Symptoms are consistent with sciatica.  Patient was treated with IM pain meds.  She is feeling somewhat better.  Recommend close follow-up with her pain management providers.        Final Clinical Impression(s) / ED Diagnoses Final diagnoses:  Chronic left-sided low back pain with left-sided sciatica    Rx / DC Orders ED Discharge Orders     None         Linwood Dibbles, MD 07/22/21 1136

## 2021-08-05 ENCOUNTER — Encounter: Payer: Self-pay | Admitting: Gastroenterology
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
## 2021-08-08 ENCOUNTER — Ambulatory Visit: Payer: BC Managed Care – PPO | Admitting: Gastroenterology

## 2021-08-27 ENCOUNTER — Ambulatory Visit: Payer: BC Managed Care – PPO | Admitting: Internal Medicine

## 2021-09-03 ENCOUNTER — Ambulatory Visit: Payer: BC Managed Care – PPO | Admitting: Internal Medicine

## 2021-09-05 IMAGING — MR MR LUMBAR SPINE W/O CM
4 of 5 series · 30 of 48 positions shown · non-contrast
Comparison: Lumbar spine MRI 08/24/2016.

CLINICAL DATA: Post-laminectomy syndrome. Additional history
provided by scanning technologist: Patient reports low back pain
radiating down left leg October 2019. Neurostimulator.

EXAM:
MRI LUMBAR SPINE WITHOUT CONTRAST
TECHNIQUE: Multiplanar, multisequence MR imaging of the lumbar spine was
performed. No intravenous contrast was administered.

[Series 5: T2 · sagittal · 4.0mm · 0.68mm/px · 6 of 13 slices shown (1 of 2)]
[im 1/13]
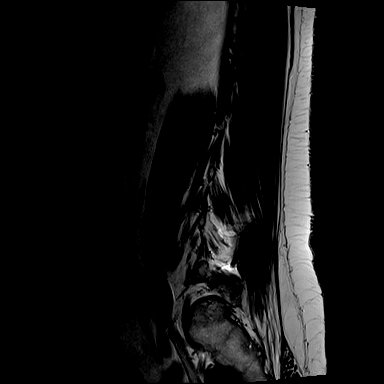
[im 3/13]
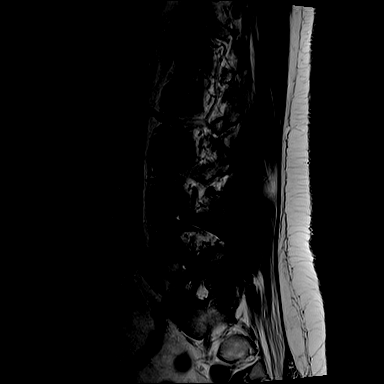
[im 5/13]
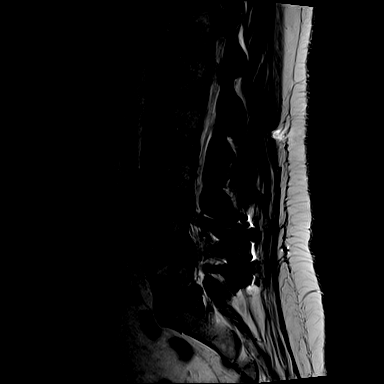
[im 8/13]
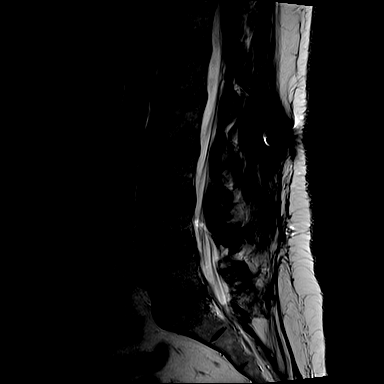
[im 10/13]
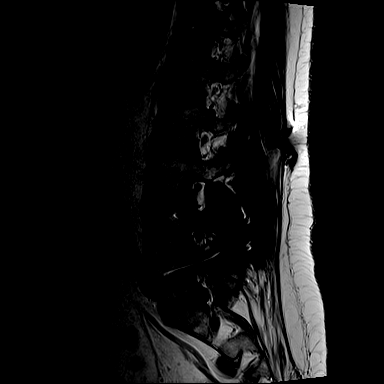
[im 13/13]
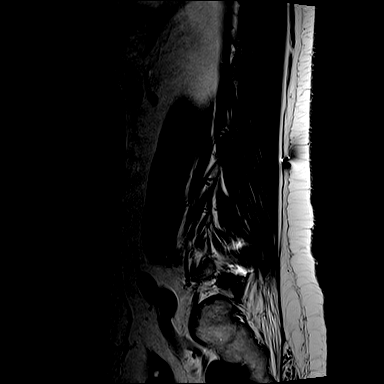

[Series 6: T1 · sagittal · 4.0mm · 0.81mm/px · 6 of 13 slices shown (1 of 2)]
[im 1/13]
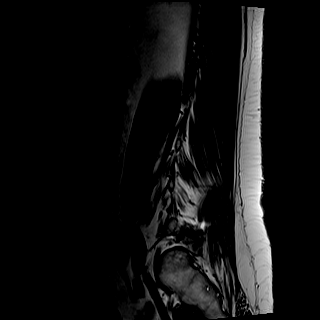
[im 3/13]
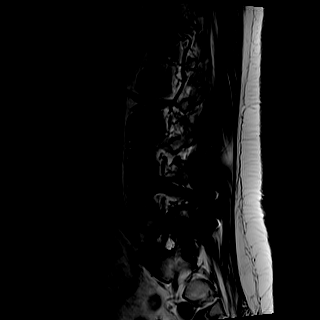
[im 5/13]
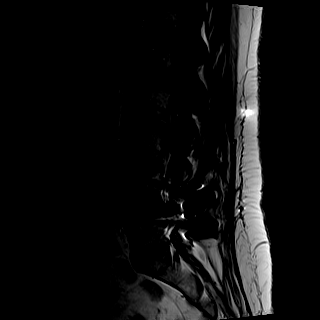
[im 8/13]
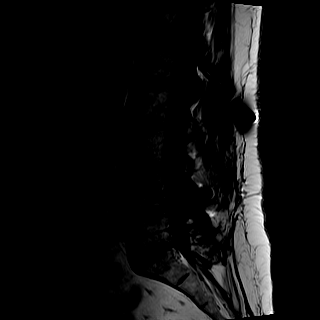
[im 10/13]
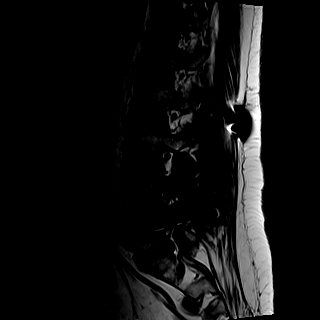
[im 13/13]
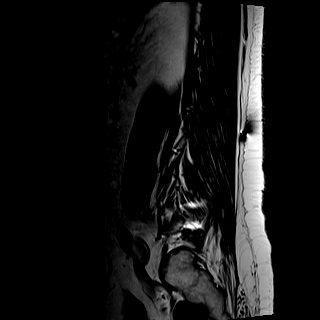

[Series 8: T2 · axial · 4.0mm · 0.70mm/px · z∈[-151,+41]mm · 9 of 33 slices shown (2 of 2)]
[im 1/33]
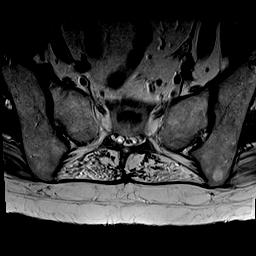
[im 5/33]
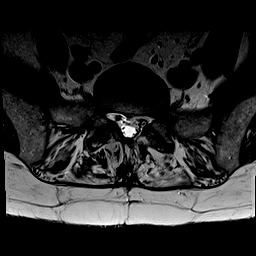
[im 10/33]
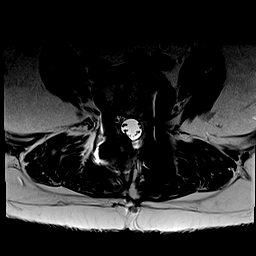
[im 14/33]
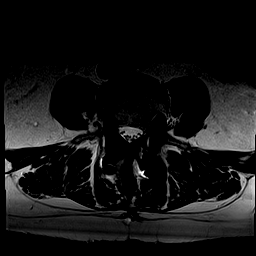
[im 17/33]
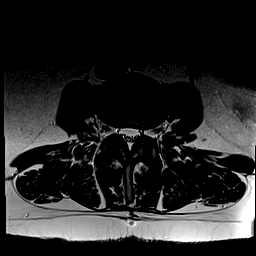
[im 19/33]
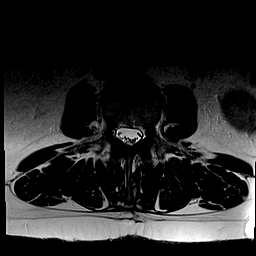
[im 23/33]
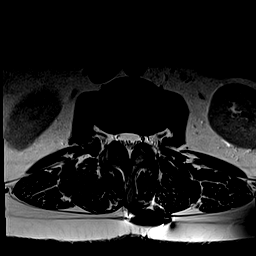
[im 28/33]
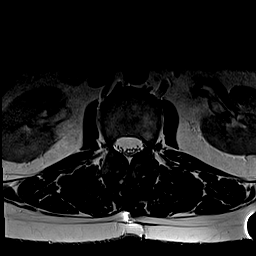
[im 33/33]
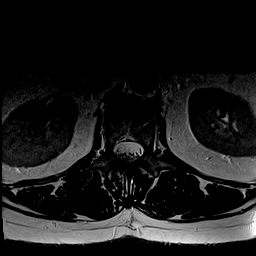

[Series 9: T1 · axial · 4.0mm · 0.35mm/px · z∈[-151,+41]mm · 9 of 33 slices shown (2 of 2)]
[im 1/33]
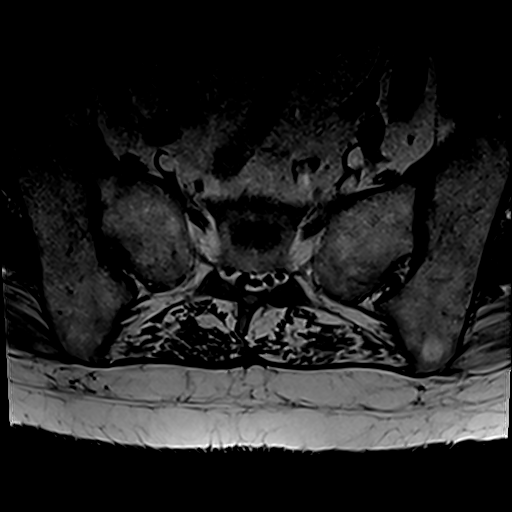
[im 5/33]
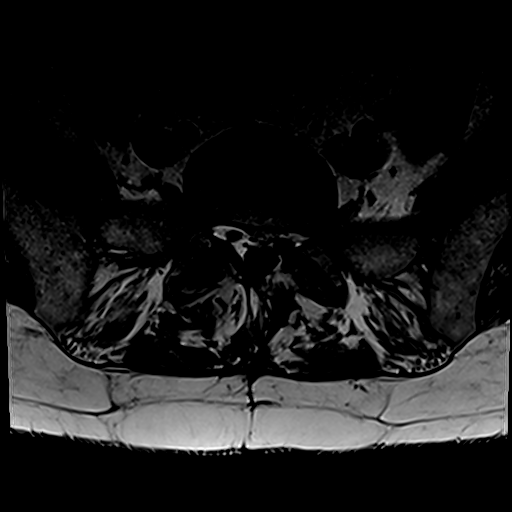
[im 10/33]
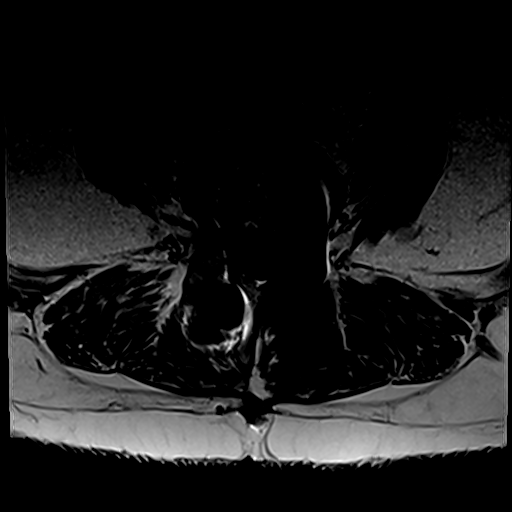
[im 14/33]
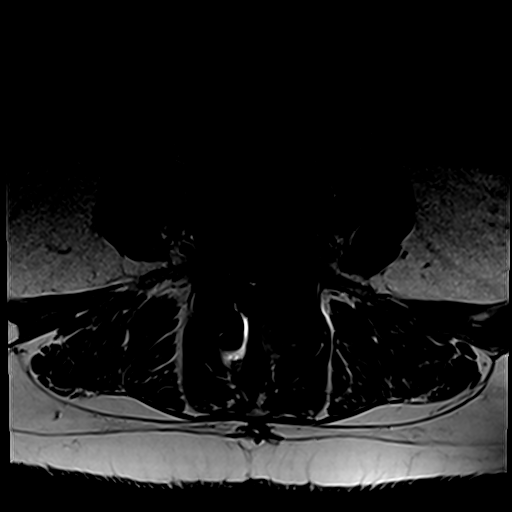
[im 17/33]
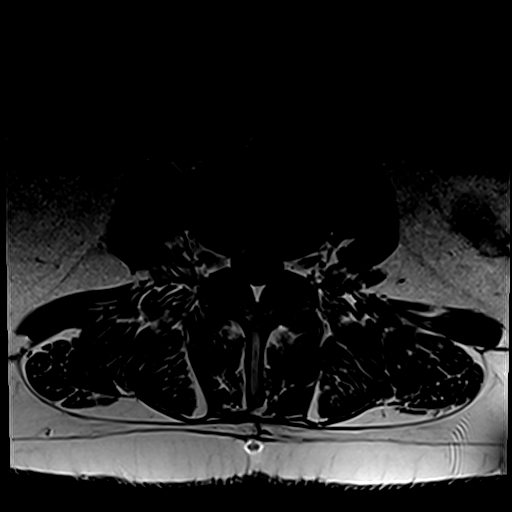
[im 19/33]
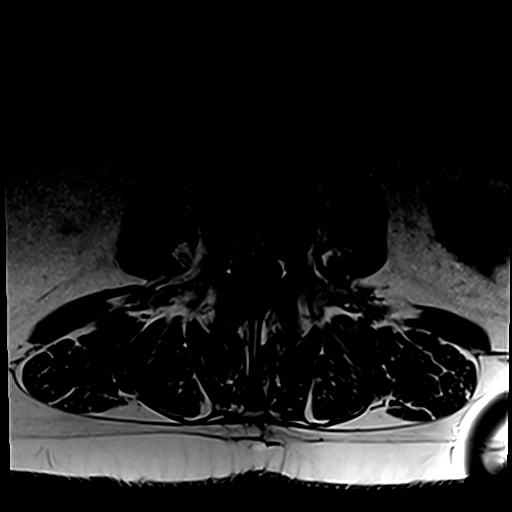
[im 23/33]
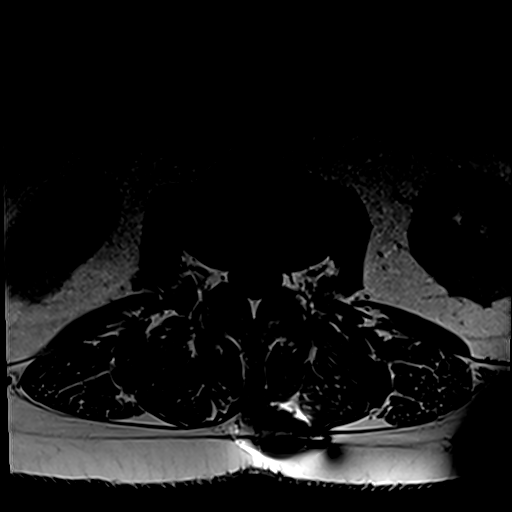
[im 28/33]
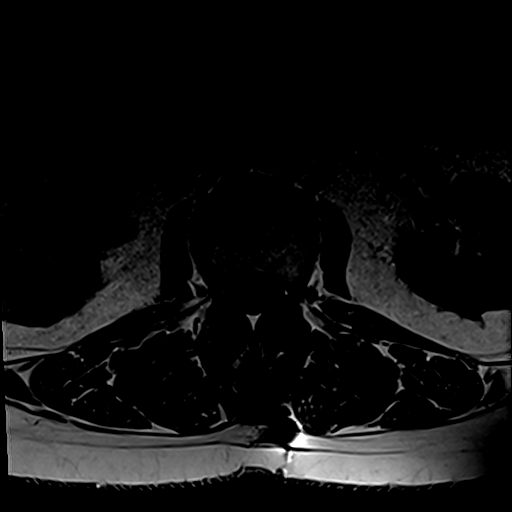
[im 33/33]
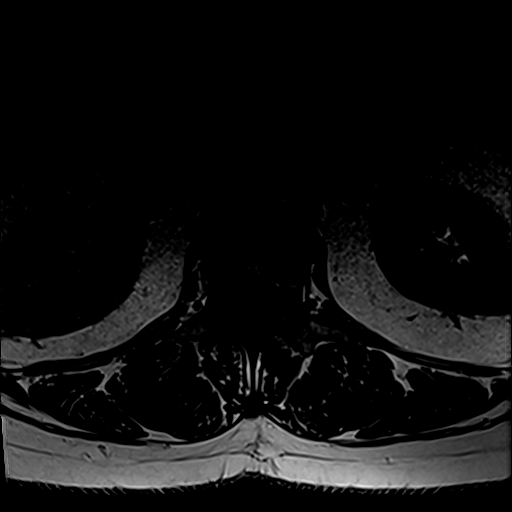

[30 of 48 positions shown; findings below may reference images not displayed]

FINDINGS: Segmentation: 5 lumbar vertebrae. The lowest well-formed
intervertebral disc space is designated L5-S1.

Alignment: Straightening of the expected lumbar lordosis. No
significant spondylolisthesis.

Vertebrae: Vertebral body height is maintained. Susceptibility
artifact arising from posterior spinal fusion hardware at L4-L5. No
appreciable significant marrow edema or focal suspicious osseous
lesion.

Conus medullaris and cauda equina: Conus extends to the T12-L1
level. No signal abnormality within the visualized distal spinal
cord.

Paraspinal and other soft tissues: No abnormality identified within
included portions of the abdomen/retroperitoneum. Apart from
postoperative changes, the paraspinal soft tissues are unremarkable.

Disc levels:

Unless otherwise stated, the level by level findings below have not
significantly changed since prior MRI 08/24/2016.

Apparent fusion across the L4-L5 disc space. Mild disc degeneration
within the lumbar spine, most notably at L5-S1.

T12-L1: Imaged sagittally. No significant disc herniation or
stenosis.

L1-L2: Facet arthrosis with mild ligamentum flavum hypertrophy. No
significant disc herniation or stenosis.

L2-L3: Facet arthrosis with mild ligamentum flavum hypertrophy. No
significant disc herniation or stenosis.

L3-L4: Disc bulge. Moderate facet arthrosis with ligamentum flavum
hypertrophy. Mild left greater than right bilateral subarticular
narrowing without appreciable nerve root impingement. Central canal
patent. No significant foraminal stenosis.

L4-L5: Prior posterior decompression and spinal fusion. Endplate
spurring. Posterior element hypertrophy. No significant spinal canal
or foraminal stenosis.

L5-S1: Prior left laminectomy and presumed discectomy. Endplate
spurring. Redemonstrated small left subarticular/foraminal disc
protrusion. Mild to moderate facet arthrosis. As before, the disc
protrusion contributes to mild left subarticular narrowing,
contacting and posteriorly displacing the descending left S1 nerve
root (series 8, image 28). Central canal patent. Mild left neural
foraminal narrowing.
IMPRESSION: Lumbar spondylosis and postoperative sequelae, as described and
having not significantly changed from the MRI of 08/24/2016.
Findings are most notably as follows.

At L5-S1, sequela of prior left laminectomy and discectomy.
Redemonstrated small left subarticular/foraminal disc protrusion. As
before, the disc protrusion contributes to mild left subarticular
narrowing, contacting and posteriorly displacing the descending left
S1 nerve root. It also contributes to multifactorial mild left
neural foraminal narrowing.

At L3-L4, there is a disc bulge with moderate facet arthrosis and
mild ligamentum flavum hypertrophy. Mild left greater than right
subarticular narrowing without appreciable nerve root impingement.
Central canal and neural foramina patent.

At L4-L5, prior posterior decompression and spinal fusion. No
residual or recurrent stenosis.

## 2021-10-01 ENCOUNTER — Ambulatory Visit (INDEPENDENT_AMBULATORY_CARE_PROVIDER_SITE_OTHER): Payer: BC Managed Care – PPO | Admitting: Internal Medicine

## 2021-10-01 ENCOUNTER — Other Ambulatory Visit: Payer: Self-pay

## 2021-10-01 ENCOUNTER — Encounter: Payer: Self-pay | Admitting: Internal Medicine

## 2021-10-01 VITALS — BP 158/91 | HR 89 | Temp 97.7°F | Ht 64.0 in | Wt 186.0 lb

## 2021-10-01 DIAGNOSIS — R1319 Other dysphagia: Secondary | ICD-10-CM

## 2021-10-01 DIAGNOSIS — K219 Gastro-esophageal reflux disease without esophagitis: Secondary | ICD-10-CM | POA: Diagnosis not present

## 2021-10-01 MED ORDER — DEXLANSOPRAZOLE 60 MG PO CPDR: 60.0000 mg | DELAYED_RELEASE_CAPSULE | Freq: Every day | ORAL | 3 refills | Status: DC
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
## 2021-10-01 NOTE — Patient Instructions (Signed)
It was good to see you again today!  As discussed, we will proceed with scheduling an EGD with esophageal dilation (ASA 3) for dysphagia and reflux.  Stop Protonix; begin Dexilant 60 mg capsule take 130 minutes before breakfast daily (dispense 30 with 11 refills)  Bowel irregularity likely due to the addition of metformin recently.  Plan for repeat colonoscopy in 2025-see no reason to do it earlier.  Weight loss will be of great benefit to both your diabetes and GERD.  GERD information provided  Further recommendations to follow.

## 2021-10-16 ENCOUNTER — Encounter: Payer: Self-pay | Admitting: *Deleted

## 2021-11-13 ENCOUNTER — Encounter: Payer: Self-pay | Admitting: Internal Medicine

## 2021-11-13 ENCOUNTER — Encounter: Payer: Self-pay | Admitting: *Deleted

## 2021-11-13 ENCOUNTER — Ambulatory Visit (INDEPENDENT_AMBULATORY_CARE_PROVIDER_SITE_OTHER): Payer: BC Managed Care – PPO | Admitting: Internal Medicine

## 2021-11-13 VITALS — BP 139/81 | HR 81 | Temp 98.3°F | Ht 64.0 in | Wt 184.8 lb

## 2021-11-13 DIAGNOSIS — T402X5A Adverse effect of other opioids, initial encounter: Secondary | ICD-10-CM

## 2021-11-13 DIAGNOSIS — Z86010 Personal history of colonic polyps: Secondary | ICD-10-CM

## 2021-11-13 DIAGNOSIS — K5903 Drug induced constipation: Secondary | ICD-10-CM

## 2021-11-13 DIAGNOSIS — K219 Gastro-esophageal reflux disease without esophagitis: Secondary | ICD-10-CM

## 2021-11-13 DIAGNOSIS — R1319 Other dysphagia: Secondary | ICD-10-CM | POA: Diagnosis not present

## 2021-11-13 MED ORDER — DEXLANSOPRAZOLE 60 MG PO CPDR: 60.0000 mg | DELAYED_RELEASE_CAPSULE | Freq: Every day | ORAL | 3 refills | Status: DC

## 2021-11-13 MED ORDER — PEG 3350-KCL-NA BICARB-NACL 420 G PO SOLR: 4000.0000 mL | Freq: Once | ORAL | 0 refills | Status: AC
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
## 2021-11-13 NOTE — Patient Instructions (Signed)
It was good to see you again today!  For your bowel function:  Begin Benefiber 1 heaping tablespoon daily  Add MiraLAX 1 capful of powder in 8 ounces water at bedtime.  Take it every night.  Only reason this hold off is if you get diarrhea  In addition to scheduling an EGD with possible dilation in the near future, we will add colonoscopy given history of rectal bleeding and history of polyps.  Both procedures ASA 3)  Begin Dexilant 60 mg daily 30 minutes before breakfast.  New prescription should be available at the drugstore.  Further recommendations to follow.

## 2021-11-14 ENCOUNTER — Encounter: Payer: Self-pay | Admitting: *Deleted

## 2021-11-18 ENCOUNTER — Encounter (HOSPITAL_COMMUNITY): Payer: BC Managed Care – PPO

## 2021-12-23 NOTE — Patient Instructions (Signed)
Ashley [name]  12/23/2021     '@PREFPERIOPPHARMACY'$ @   Your procedure is scheduled on 12/26/2021.   Report to [name] at  [phone]  A.M.   Call this number if you have problems the morning of surgery:  [phone]  If you experience any cold or flu symptoms such as cough, fever, chills, shortness of breath, etc. between now and your scheduled surgery, please notify us at the above number.   Remember:  Follow the diet and prep instructions given to you by the office.     Take these medicines the morning of surgery with A SIP OF WATER           buspar, fioricet or hydrocodone (if needed), flexeril, dexilant, gabapentin,  claritin, ativan(if needed), zofran (if needed), propranolol.     Do not wear jewelry, make-up or nail polish.  Do not wear lotions, powders, or perfumes, or deodorant.  Do not shave 48 hours prior to surgery.  Men may shave face and neck.  Do not bring valuables to the hospital.  [facility] is not responsible for any belongings or valuables.  Contacts, dentures or bridgework may not be worn into surgery.  Leave your suitcase in the car.  After surgery it may be brought to your room.  For patients admitted to the hospital, discharge time will be determined by your treatment team.  Patients discharged the day of surgery will not be allowed to drive home and must have someone with them for 24 hours.    Special instructions:   DO NOT smoke tobacco or vape for 24 hours before your procedure.  Please read over the following fact sheets that you were given. Anesthesia Post-op Instructions and Care and Recovery After Surgery      Upper Endoscopy, Adult, Care After After the procedure, it is common to have a sore throat. It is also common to have: Mild stomach pain or discomfort. Bloating. Nausea. Follow these instructions at home: The instructions below may help you care for yourself at home. Your health care provider may give you more  instructions. If you have questions, ask your health care provider. If you were given a sedative during the procedure, it can affect you for several hours. Do not drive or operate machinery until your health care provider says that it is safe. If you will be going home right after the procedure, plan to have a responsible adult: Take you home from the hospital or clinic. You will not be allowed to drive. Care for you for the time you are told. Follow instructions from your health care provider about what you may eat and drink. Return to your normal activities as told by your health care provider. Ask your health care provider what activities are safe for you. Take over-the-counter and prescription medicines only as told by your health care provider. Contact a health care provider if you: Have a sore throat that lasts longer than one day. Have trouble swallowing. Have a fever. Get help right away if you: Vomit blood or your vomit looks like coffee grounds. Have bloody, black, or tarry stools. Have a very bad sore throat or you cannot swallow. Have difficulty breathing or very bad pain in your chest or abdomen. These symptoms may be an emergency. Get help right away. Call 911. Do not wait to see if the symptoms will go away. Do not drive yourself to the hospital. Summary After the procedure, it is common to  have a sore throat, mild stomach discomfort, bloating, and nausea. If you were given a sedative during the procedure, it can affect you for several hours. Do not drive until your health care provider says that it is safe. Follow instructions from your health care provider about what you may eat and drink. Return to your normal activities as told by your health care provider. This information is not intended to replace advice given to you by your health care provider. Make sure you discuss any questions you have with your health care provider. Document Revised: 04/24/2021 Document Reviewed:  04/24/2021 Elsevier Patient Education  [place]. Colonoscopy, Adult, Care After The following information offers guidance on how to care for yourself after your procedure. Your health care provider may also give you more specific instructions. If you have problems or questions, contact your health care provider. What can I expect after the procedure? After the procedure, it is common to have: A small amount of blood in your stool for 24 hours after the procedure. Some gas. Mild cramping or bloating of your abdomen. Follow these instructions at home: Eating and drinking  Drink enough fluid to keep your urine pale yellow. Follow instructions from your health care provider about eating or drinking restrictions. Resume your normal diet as told by your health care provider. Avoid heavy or fried foods that are hard to digest. Activity Rest as told by your health care provider. Avoid sitting for a long time without moving. Get up to take short walks every 1-2 hours. This is important to improve blood flow and breathing. Ask for help if you feel weak or unsteady. Return to your normal activities as told by your health care provider. Ask your health care provider what activities are safe for you. Managing cramping and bloating  Try walking around when you have cramps or feel bloated. If directed, apply heat to your abdomen as told by your health care provider. Use the heat source that your health care provider recommends, such as a moist heat pack or a heating pad. Place a towel between your skin and the heat source. Leave the heat on for 20-30 minutes. Remove the heat if your skin turns bright red. This is especially important if you are unable to feel pain, heat, or cold. You have a greater risk of getting burned. General instructions If you were given a sedative during the procedure, it can affect you for several hours. Do not drive or operate machinery until your health care provider  says that it is safe. For the first 24 hours after the procedure: Do not sign important documents. Do not drink alcohol. Do your regular daily activities at a slower pace than normal. Eat soft foods that are easy to digest. Take over-the-counter and prescription medicines only as told by your health care provider. Keep all follow-up visits. This is important. Contact a health care provider if: You have blood in your stool 2-3 days after the procedure. Get help right away if: You have more than a small spotting of blood in your stool. You have large blood clots in your stool. You have swelling of your abdomen. You have nausea or vomiting. You have a fever. You have increasing pain in your abdomen that is not relieved with medicine. These symptoms may be an emergency. Get help right away. Call 911. Do not wait to see if the symptoms will go away. Do not drive yourself to the hospital. Summary After the procedure, it is common to  have a small amount of blood in your stool. You may also have mild cramping and bloating of your abdomen. If you were given a sedative during the procedure, it can affect you for several hours. Do not drive or operate machinery until your health care provider says that it is safe. Get help right away if you have a lot of blood in your stool, nausea or vomiting, a fever, or increased pain in your abdomen. This information is not intended to replace advice given to you by your health care provider. Make sure you discuss any questions you have with your health care provider. Document Revised: 09/05/2020 Document Reviewed: 09/05/2020 Elsevier Patient Education  [place] After The following information offers guidance on how to care for yourself after your procedure. Your health care provider may also give you more specific instructions. If you have problems or questions, contact your health care provider. What can I expect  after the procedure? After the procedure, it is common to have: Tiredness. Little or no memory about what happened during or after the procedure. Impaired judgment when it comes to making decisions. Nausea or vomiting. Some trouble with balance. Follow these instructions at home: For the time period you were told by your health care provider:  Rest. Do not participate in activities where you could fall or become injured. Do not drive or use machinery. Do not drink alcohol. Do not take sleeping pills or medicines that cause drowsiness. Do not make important decisions or sign legal documents. Do not take care of children on your own. Medicines Take over-the-counter and prescription medicines only as told by your health care provider. If you were prescribed antibiotics, take them as told by your health care provider. Do not stop using the antibiotic even if you start to feel better. Eating and drinking Follow instructions from your health care provider about what you may eat and drink. Drink enough fluid to keep your urine pale yellow. If you vomit: Drink clear fluids slowly and in small amounts as you are able. Clear fluids include water, ice chips, low-calorie sports drinks, and fruit juice that has water added to it (diluted fruit juice). Eat light and bland foods in small amounts as you are able. These foods include bananas, applesauce, rice, lean meats, toast, and crackers. General instructions  Have a responsible adult stay with you for the time you are told. It is important to have someone help care for you until you are awake and alert. If you have sleep apnea, surgery and some medicines can increase your risk for breathing problems. Follow instructions from your health care provider about wearing your sleep device: When you are sleeping. This includes during daytime naps. While taking prescription pain medicines, sleeping medicines, or medicines that make you drowsy. Do not use  any products that contain nicotine or tobacco. These products include cigarettes, chewing tobacco, and vaping devices, such as e-cigarettes. If you need help quitting, ask your health care provider. Contact a health care provider if: You feel nauseous or vomit every time you eat or drink. You feel light-headed. You are still sleepy or having trouble with balance after 24 hours. You get a rash. You have a fever. You have redness or swelling around the IV site. Get help right away if: You have trouble breathing. You have new confusion after you get home. These symptoms may be an emergency. Get help right away. Call 911. Do not wait to see if the symptoms  will go away. Do not drive yourself to the hospital. This information is not intended to replace advice given to you by your health care provider. Make sure you discuss any questions you have with your health care provider. Document Revised: 06/10/2021 Document Reviewed: 06/10/2021 Elsevier Patient Education  [place].

## 2021-12-24 ENCOUNTER — Encounter (HOSPITAL_COMMUNITY): Payer: Self-pay

## 2021-12-24 ENCOUNTER — Telehealth (INDEPENDENT_AMBULATORY_CARE_PROVIDER_SITE_OTHER): Payer: Self-pay | Admitting: *Deleted

## 2021-12-24 ENCOUNTER — Encounter (HOSPITAL_COMMUNITY)
Admission: RE | Admit: 2021-12-24 | Discharge: 2021-12-24 | Disposition: A | Discharge status: Home / Self Care | Payer: BC Managed Care – PPO | Source: Ambulatory Visit | Attending: Internal Medicine | Admitting: Internal Medicine

## 2021-12-24 VITALS — BP 145/82 | HR 70 | Temp 97.8°F | Resp 18 | Ht 64.0 in | Wt 184.7 lb

## 2021-12-24 DIAGNOSIS — Z01810 Encounter for preprocedural cardiovascular examination: Secondary | ICD-10-CM | POA: Insufficient documentation

## 2021-12-24 DIAGNOSIS — Z79899 Other long term (current) drug therapy: Secondary | ICD-10-CM | POA: Diagnosis not present

## 2021-12-24 DIAGNOSIS — K921 Melena: Secondary | ICD-10-CM | POA: Diagnosis not present

## 2021-12-24 DIAGNOSIS — G4733 Obstructive sleep apnea (adult) (pediatric): Secondary | ICD-10-CM | POA: Diagnosis not present

## 2021-12-24 DIAGNOSIS — K449 Diaphragmatic hernia without obstruction or gangrene: Secondary | ICD-10-CM | POA: Diagnosis not present

## 2021-12-24 DIAGNOSIS — K5900 Constipation, unspecified: Secondary | ICD-10-CM | POA: Diagnosis not present

## 2021-12-24 DIAGNOSIS — D120 Benign neoplasm of cecum: Secondary | ICD-10-CM | POA: Diagnosis not present

## 2021-12-24 DIAGNOSIS — K5730 Diverticulosis of large intestine without perforation or abscess without bleeding: Secondary | ICD-10-CM | POA: Diagnosis not present

## 2021-12-24 DIAGNOSIS — E119 Type 2 diabetes mellitus without complications: Secondary | ICD-10-CM | POA: Diagnosis not present

## 2021-12-24 DIAGNOSIS — R1310 Dysphagia, unspecified: Secondary | ICD-10-CM | POA: Diagnosis present

## 2021-12-24 DIAGNOSIS — K219 Gastro-esophageal reflux disease without esophagitis: Secondary | ICD-10-CM | POA: Diagnosis not present

## 2021-12-24 DIAGNOSIS — I471 Supraventricular tachycardia, unspecified: Secondary | ICD-10-CM | POA: Insufficient documentation

## 2021-12-24 DIAGNOSIS — Z8711 Personal history of peptic ulcer disease: Secondary | ICD-10-CM | POA: Diagnosis not present

## 2021-12-24 DIAGNOSIS — Z91199 Patient's noncompliance with other medical treatment and regimen due to unspecified reason: Secondary | ICD-10-CM | POA: Diagnosis not present

## 2021-12-24 DIAGNOSIS — K640 First degree hemorrhoids: Secondary | ICD-10-CM | POA: Diagnosis not present

## 2021-12-24 DIAGNOSIS — K222 Esophageal obstruction: Secondary | ICD-10-CM | POA: Diagnosis not present

## 2021-12-24 DIAGNOSIS — M797 Fibromyalgia: Secondary | ICD-10-CM | POA: Diagnosis not present

## 2021-12-24 DIAGNOSIS — M1990 Unspecified osteoarthritis, unspecified site: Secondary | ICD-10-CM | POA: Diagnosis not present

## 2021-12-24 DIAGNOSIS — F419 Anxiety disorder, unspecified: Secondary | ICD-10-CM | POA: Diagnosis not present

## 2021-12-24 DIAGNOSIS — G43909 Migraine, unspecified, not intractable, without status migrainosus: Secondary | ICD-10-CM | POA: Diagnosis not present

## 2021-12-24 DIAGNOSIS — I10 Essential (primary) hypertension: Secondary | ICD-10-CM | POA: Diagnosis not present

## 2021-12-24 DIAGNOSIS — F32A Depression, unspecified: Secondary | ICD-10-CM | POA: Diagnosis not present

## 2021-12-24 DIAGNOSIS — Z86010 Personal history of colonic polyps: Secondary | ICD-10-CM | POA: Diagnosis not present

## 2021-12-24 HISTORY — DX: Type 2 diabetes mellitus without complications: E11.9

## 2021-12-24 NOTE — Telephone Encounter (Signed)
LMOVM to call back 

## 2021-12-24 NOTE — Telephone Encounter (Signed)
-----   Message from Encarnacion Chu, RN sent at 12/24/2021  9:29 AM EST ----- Regarding: no show Good morning ladies! Ashley [name] did not show for her pre-op this morning.

## 2021-12-24 NOTE — Telephone Encounter (Signed)
Pt called back. Stated she did show up but was late.

## 2021-12-26 ENCOUNTER — Encounter (HOSPITAL_COMMUNITY)
Admission: RE | Disposition: A | Discharge status: Home / Self Care | Payer: Self-pay | Source: Home / Self Care | Attending: Internal Medicine

## 2021-12-26 ENCOUNTER — Ambulatory Visit (HOSPITAL_COMMUNITY): Payer: BC Managed Care – PPO | Admitting: Anesthesiology

## 2021-12-26 ENCOUNTER — Ambulatory Visit (HOSPITAL_COMMUNITY)
Admission: RE | Admit: 2021-12-26 | Discharge: 2021-12-26 | Disposition: A | Discharge status: Home / Self Care | Payer: BC Managed Care – PPO | Attending: Internal Medicine | Admitting: Internal Medicine

## 2021-12-26 ENCOUNTER — Encounter (HOSPITAL_COMMUNITY): Payer: Self-pay | Admitting: Internal Medicine

## 2021-12-26 DIAGNOSIS — K449 Diaphragmatic hernia without obstruction or gangrene: Secondary | ICD-10-CM | POA: Insufficient documentation

## 2021-12-26 DIAGNOSIS — K5730 Diverticulosis of large intestine without perforation or abscess without bleeding: Secondary | ICD-10-CM | POA: Insufficient documentation

## 2021-12-26 DIAGNOSIS — K921 Melena: Secondary | ICD-10-CM | POA: Insufficient documentation

## 2021-12-26 DIAGNOSIS — D120 Benign neoplasm of cecum: Secondary | ICD-10-CM | POA: Diagnosis not present

## 2021-12-26 DIAGNOSIS — Z91199 Patient's noncompliance with other medical treatment and regimen due to unspecified reason: Secondary | ICD-10-CM | POA: Insufficient documentation

## 2021-12-26 DIAGNOSIS — G4733 Obstructive sleep apnea (adult) (pediatric): Secondary | ICD-10-CM | POA: Insufficient documentation

## 2021-12-26 DIAGNOSIS — K219 Gastro-esophageal reflux disease without esophagitis: Secondary | ICD-10-CM | POA: Insufficient documentation

## 2021-12-26 DIAGNOSIS — R1310 Dysphagia, unspecified: Secondary | ICD-10-CM | POA: Diagnosis not present

## 2021-12-26 DIAGNOSIS — Z79899 Other long term (current) drug therapy: Secondary | ICD-10-CM | POA: Insufficient documentation

## 2021-12-26 DIAGNOSIS — K5900 Constipation, unspecified: Secondary | ICD-10-CM | POA: Insufficient documentation

## 2021-12-26 DIAGNOSIS — K222 Esophageal obstruction: Secondary | ICD-10-CM | POA: Insufficient documentation

## 2021-12-26 DIAGNOSIS — M797 Fibromyalgia: Secondary | ICD-10-CM | POA: Insufficient documentation

## 2021-12-26 DIAGNOSIS — G43909 Migraine, unspecified, not intractable, without status migrainosus: Secondary | ICD-10-CM | POA: Insufficient documentation

## 2021-12-26 DIAGNOSIS — K640 First degree hemorrhoids: Secondary | ICD-10-CM | POA: Insufficient documentation

## 2021-12-26 DIAGNOSIS — Z8711 Personal history of peptic ulcer disease: Secondary | ICD-10-CM | POA: Insufficient documentation

## 2021-12-26 DIAGNOSIS — F419 Anxiety disorder, unspecified: Secondary | ICD-10-CM | POA: Insufficient documentation

## 2021-12-26 DIAGNOSIS — I471 Supraventricular tachycardia, unspecified: Secondary | ICD-10-CM | POA: Insufficient documentation

## 2021-12-26 DIAGNOSIS — Z86010 Personal history of colonic polyps: Secondary | ICD-10-CM | POA: Insufficient documentation

## 2021-12-26 DIAGNOSIS — I10 Essential (primary) hypertension: Secondary | ICD-10-CM | POA: Insufficient documentation

## 2021-12-26 DIAGNOSIS — E119 Type 2 diabetes mellitus without complications: Secondary | ICD-10-CM | POA: Insufficient documentation

## 2021-12-26 DIAGNOSIS — M1990 Unspecified osteoarthritis, unspecified site: Secondary | ICD-10-CM | POA: Insufficient documentation

## 2021-12-26 DIAGNOSIS — F32A Depression, unspecified: Secondary | ICD-10-CM | POA: Insufficient documentation

## 2021-12-26 HISTORY — PX: COLONOSCOPY WITH PROPOFOL: SHX5780

## 2021-12-26 HISTORY — PX: POLYPECTOMY: SHX5525

## 2021-12-26 HISTORY — PX: MALONEY DILATION: SHX5535

## 2021-12-26 HISTORY — PX: ESOPHAGOGASTRODUODENOSCOPY (EGD) WITH PROPOFOL: SHX5813

## 2021-12-26 LAB — GLUCOSE, CAPILLARY: Glucose-Capillary: 118 mg/dL — ABNORMAL HIGH (ref 70–99)

## 2021-12-26 SURGERY — ESOPHAGOGASTRODUODENOSCOPY (EGD) WITH PROPOFOL
Anesthesia: General

## 2021-12-26 MED ORDER — LACTATED RINGERS IV SOLN: INTRAVENOUS | Status: DC | PRN

## 2021-12-26 MED ORDER — STERILE WATER FOR IRRIGATION IR SOLN
Status: DC | PRN
  Administered 2021-12-26: 120 mL

## 2021-12-26 MED ORDER — PROPOFOL 500 MG/50ML IV EMUL
INTRAVENOUS | Status: DC | PRN
  Administered 2021-12-26: 150 ug/kg/min via INTRAVENOUS

## 2021-12-26 MED ORDER — PROPOFOL 10 MG/ML IV BOLUS
INTRAVENOUS | Status: DC | PRN
  Administered 2021-12-26: 30 mg via INTRAVENOUS
  Administered 2021-12-26: 50 mg via INTRAVENOUS
  Administered 2021-12-26: 20 mg via INTRAVENOUS

## 2021-12-26 MED ORDER — LIDOCAINE HCL (CARDIAC) PF 100 MG/5ML IV SOSY
PREFILLED_SYRINGE | INTRAVENOUS | Status: DC | PRN
  Administered 2021-12-26: 50 mg via INTRAVENOUS
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
## 2021-12-26 NOTE — Op Note (Signed)
[facility] Patient Name: Ashley [name] Procedure Date: 12/26/2021 7:54 AM MRN: [id number] Date of Birth: 1965-12-05 Attending MD: Norvel Richards , MD, [id number] CSN: [id number] Age: 56 Admit Type: Outpatient Procedure:                Upper GI endoscopy Indications:              Dysphagia Providers:                Norvel Richards, MD, Caprice Kluver, Aram Candela Referring MD:              Medicines:                Propofol per Anesthesia Complications:            No immediate complications. Estimated Blood Loss:     Estimated blood loss was minimal. Procedure:                Pre-Anesthesia Assessment:                           - Prior to the procedure, a History and Physical                            was performed, and patient medications and                            allergies were reviewed. The patient's tolerance of                            previous anesthesia was also reviewed. The risks                            and benefits of the procedure and the sedation                            options and risks were discussed with the patient.                            All questions were answered, and informed consent                            was obtained. Prior Anticoagulants: The patient has                            taken no anticoagulant or antiplatelet agents. ASA                            Grade Assessment: III - A patient with severe                            systemic disease. After reviewing the risks and                            benefits, the patient was deemed in satisfactory  condition to undergo the procedure.                           After obtaining informed consent, the endoscope was                            passed under direct vision. Throughout the                            procedure, the patient's blood pressure, pulse, and                            oxygen saturations were monitored continuously. The                             [id number]-H190 ([id number]) scope was introduced through the                            mouth, and advanced to the second part of duodenum.                            The upper GI endoscopy was accomplished without                            difficulty. The patient tolerated the procedure                            well. Scope In: 8:11:39 AM Scope Out: 8:18:11 AM Total Procedure Duration: 0 hours 6 minutes 32 seconds  Findings:      A mild Schatzki ring was found at the gastroesophageal junction.       Otherwise, esophagus appeared normal.      A small hiatal hernia was present. Normal-appearing gastric mucosa.       Patent pylorus.      The duodenal bulb and second portion of the duodenum were normal. The       scope was withdrawn. Dilation was performed with a Maloney dilator with       mild resistance at 88 Fr. The scope was withdrawn. Dilation was       performed with a Maloney dilator with mild resistance at 56 Fr. The       dilation site was examined following endoscope reinsertion and showed no       change. Estimated blood loss was minimal. Impression:               - Mild Schatzki ring.                           - Small hiatal hernia.                           - Normal duodenal bulb and second portion of the                            duodenum.                           -  No specimens collected. Moderate Sedation:      Moderate (conscious) sedation was personally administered by an       anesthesia professional. The following parameters were monitored: oxygen       saturation, heart rate, blood pressure, respiratory rate, EKG, adequacy       of pulmonary ventilation, and response to care. Recommendation:           - Patient has a contact number available for                            emergencies. The signs and symptoms of potential                            delayed complications were discussed with the                            patient. Return to normal activities  tomorrow.                            Written discharge instructions were provided to the                            patient.                           - Advance diet as tolerated. Begin Dexilant 60 mg                            daily as previously recommended. Office visit with                            [place] in 3 months. See colonoscopy report. Procedure Code(s):        --- Professional ---                           [phone], Esophagogastroduodenoscopy, flexible,                            transoral; diagnostic, including collection of                            specimen(s) by brushing or washing, when performed                            (separate procedure)                           43450, Dilation of esophagus, by unguided sound or                            bougie, single or multiple passes Diagnosis Code(s):        --- Professional ---                           K22.2, Esophageal obstruction  K44.9, Diaphragmatic hernia without obstruction or                            gangrene                           R13.10, Dysphagia, unspecified CPT copyright 2022 American [employer] Association. All rights reserved. The codes documented in this report are preliminary and upon coder review may  be revised to meet current compliance requirements. [name] , MD Norvel Richards, MD 12/26/2021 8:40:45 AM This report has been signed electronically. Number of Addenda: 0

## 2021-12-26 NOTE — Anesthesia Postprocedure Evaluation (Signed)
Anesthesia Post Note  Patient: Ashley [name]  Procedure(s) Performed: ESOPHAGOGASTRODUODENOSCOPY (EGD) WITH PROPOFOL COLONOSCOPY WITH PROPOFOL [place] POLYPECTOMY  Patient location during evaluation: Phase II Anesthesia Type: General Level of consciousness: awake and alert and oriented Pain management: pain level controlled Vital Signs Assessment: post-procedure vital signs reviewed and stable Respiratory status: spontaneous breathing, nonlabored ventilation and respiratory function stable Cardiovascular status: blood pressure returned to baseline and stable Postop Assessment: no apparent nausea or vomiting Anesthetic complications: no  No notable events documented.   Last Vitals:  Vitals:   12/26/21 0706 12/26/21 0842  BP: 138/83 108/63  Pulse: 71 77  Resp: 14 17  Temp: 37.1 C 36.9 C  SpO2: 98% 99%    Last Pain:  Vitals:   12/26/21 0842  TempSrc: Oral  PainSc: 7                   C 

## 2021-12-26 NOTE — Op Note (Signed)
[facility] Patient Name: Ashley [name] Procedure Date: 12/26/2021 7:51 AM MRN: [id number] Date of Birth: 1965-06-05 Attending MD: Norvel Richards , MD, [id number] CSN: [id number] Age: 56 Admit Type: Outpatient Procedure:                Colonoscopy Indications:              Hematochezia Providers:                Norvel Richards, MD, Caprice Kluver, Aram Candela Referring MD:              Medicines:                Propofol per Anesthesia Complications:            No immediate complications. Estimated Blood Loss:     Estimated blood loss was minimal. Procedure:                Pre-Anesthesia Assessment:                           - Prior to the procedure, a History and Physical                            was performed, and patient medications and                            allergies were reviewed. The patient's tolerance of                            previous anesthesia was also reviewed. The risks                            and benefits of the procedure and the sedation                            options and risks were discussed with the patient.                            All questions were answered, and informed consent                            was obtained. Prior Anticoagulants: The patient has                            taken no anticoagulant or antiplatelet agents. ASA                            Grade Assessment: III - A patient with severe                            systemic disease. After reviewing the risks and                            benefits, the patient was deemed in satisfactory  condition to undergo the procedure.                           After obtaining informed consent, the colonoscope                            was passed under direct vision. Throughout the                            procedure, the patient's blood pressure, pulse, and                            oxygen saturations were monitored continuously. The                             [id number]) scope was introduced through the                            anus and advanced to the the cecum, identified by                            appendiceal orifice and ileocecal valve. The                            colonoscopy was performed without difficulty. The                            patient tolerated the procedure well. The quality                            of the bowel preparation was adequate. The                            ileocecal valve, appendiceal orifice, and rectum                            were photographed. Scope In: 8:23:30 AM Scope Out: 8:38:25 AM Scope Withdrawal Time: 0 hours 11 minutes 14 seconds  Total Procedure Duration: 0 hours 14 minutes 55 seconds  Findings:      The perianal and digital rectal examinations were normal.      Scattered medium-mouthed diverticula were found in the sigmoid colon and       descending colon.      A 5 mm polyp was found in the cecum. The polyp was sessile. The polyp       was removed with a cold snare. Resection and retrieval were complete.       Estimated blood loss was minimal.      Non-bleeding internal hemorrhoids were found during retroflexion. The       hemorrhoids were mild, small and Grade I (internal hemorrhoids that do       not prolapse).      The exam was otherwise without abnormality on direct and retroflexion       views. Impression:               - Diverticulosis in the sigmoid colon and in  the                            descending colon.                           - One 5 mm polyp in the cecum, removed with a cold                            snare. Resected and retrieved.                           - Non-bleeding internal hemorrhoids.                           - The examination was otherwise normal on direct                            and retroflexion views. I suspect paper                            hematochezia secondary to benign anorectal source                            in the setting  of constipation. Moderate Sedation:      Moderate (conscious) sedation was personally administered by an       anesthesia professional. The following parameters were monitored: oxygen       saturation, heart rate, blood pressure, respiratory rate, EKG, adequacy       of pulmonary ventilation, and response to care. Recommendation:           - Patient has a contact number available for                            emergencies. The signs and symptoms of potential                            delayed complications were discussed with the                            patient. Return to normal activities tomorrow.                            Written discharge instructions were provided to the                            patient.                           - Advance diet as tolerated.                           - Continue present medications.                           - Repeat colonoscopy date to be determined after  pending pathology results are reviewed for                            surveillance.                           - Return to GI office in 3 months. Continue daily                            fiber and MiraLAX. See EGD report. Procedure Code(s):        --- Professional ---                           [id number], Colonoscopy, flexible; with removal of                            tumor(s), polyp(s), or other lesion(s) by snare                            technique Diagnosis Code(s):        --- Professional ---                           K64.0, First degree hemorrhoids                           D12.0, Benign neoplasm of cecum                           K92.1, Melena (includes Hematochezia)                           K57.30, Diverticulosis of large intestine without                            perforation or abscess without bleeding CPT copyright 2022 American Medical Association. All rights reserved. The codes documented in this report are preliminary and upon coder review may  be  revised to meet current compliance requirements. [name] , MD Norvel Richards, MD 12/26/2021 8:52:19 AM This report has been signed electronically. Number of Addenda: 0

## 2021-12-26 NOTE — Transfer of Care (Signed)
Immediate Anesthesia Transfer of Care Note  Patient: Ashley [name]  Procedure(s) Performed: ESOPHAGOGASTRODUODENOSCOPY (EGD) WITH PROPOFOL COLONOSCOPY WITH PROPOFOL MALONEY DILATION POLYPECTOMY  Patient Location: PACU  Anesthesia Type:MAC  Level of Consciousness: drowsy  Airway & Oxygen Therapy: Patient Spontanous Breathing and Patient connected to face mask oxygen  Post-op Assessment: Report given to RN and Post -op Vital signs reviewed and stable  Post vital signs: Reviewed and stable  Last Vitals:  Vitals Value Taken Time  BP    Temp    Pulse    Resp    SpO2      Last Pain:  Vitals:   12/26/21 0808  TempSrc:   PainSc: 6       Patients Stated Pain Goal: 6 (36/68/15 9470)  Complications: No notable events documented.

## 2021-12-26 NOTE — Anesthesia Preprocedure Evaluation (Signed)
Anesthesia Evaluation  Patient identified by MRN, date of birth, ID band Patient awake    Reviewed: Allergy & Precautions, H&P , NPO status , Patient's Chart, lab work & pertinent test results, reviewed documented beta blocker date and time   History of Anesthesia Complications Negative for: history of anesthetic complications  Airway Mallampati: II  TM Distance: >3 FB Neck ROM: Full   Comment: Neck pain Dental  (+) Dental Advisory Given Fillings :   Pulmonary sleep apnea (non complaint)    Pulmonary exam normal breath sounds clear to auscultation       Cardiovascular hypertension, Pt. on medications and Pt. on home beta blockers + dysrhythmias Supra Ventricular Tachycardia  Rhythm:Regular Rate:Normal  PACs   Neuro/Psych  Headaches PSYCHIATRIC DISORDERS Anxiety Depression     Neuromuscular disease    GI/Hepatic Neg liver ROS, PUD,GERD  Medicated and Poorly Controlled,,  Endo/Other  diabetes, Well Controlled, Type 2    Renal/GU negative Renal ROS  negative genitourinary   Musculoskeletal  (+) Arthritis ,  Fibromyalgia -  Abdominal   Peds negative pediatric ROS (+)  Hematology negative hematology ROS (+)   Anesthesia Other Findings   Reproductive/Obstetrics negative OB ROS                             Anesthesia Physical Anesthesia Plan  ASA: 3  Anesthesia Plan: General   Post-op Pain Management: Minimal or no pain anticipated   Induction:   PONV Risk Score and Plan: 1 and Propofol infusion  Airway Management Planned: Nasal Cannula and Natural Airway  Additional Equipment:   Intra-op Plan:   Post-operative Plan:   Informed Consent: I have reviewed the patients History and Physical, chart, labs and discussed the procedure including the risks, benefits and alternatives for the proposed anesthesia with the patient or authorized representative who has indicated his/her  understanding and acceptance.     Dental advisory given  Plan Discussed with: CRNA and Surgeon  Anesthesia Plan Comments:         Anesthesia Quick Evaluation

## 2021-12-26 NOTE — Discharge Instructions (Signed)
EGD Discharge instructions Please read the instructions outlined below and refer to this sheet in the next few weeks. These discharge instructions provide you with general information on caring for yourself after you leave the hospital. Your doctor may also give you specific instructions. While your treatment has been planned according to the most current medical practices available, unavoidable complications occasionally occur. If you have any problems or questions after discharge, please call your doctor. ACTIVITY You may resume your regular activity but move at a slower pace for the next 24 hours.  Take frequent rest periods for the next 24 hours.  Walking will help expel (get rid of) the air and reduce the bloated feeling in your abdomen.  No driving for 24 hours (because of the anesthesia (medicine) used during the test).  You may shower.  Do not sign any important legal documents or operate any machinery for 24 hours (because of the anesthesia used during the test).  NUTRITION Drink plenty of fluids.  You may resume your normal diet.  Begin with a light meal and progress to your normal diet.  Avoid alcoholic beverages for 24 hours or as instructed by your caregiver.  MEDICATIONS You may resume your normal medications unless your caregiver tells you otherwise.  WHAT YOU CAN EXPECT TODAY You may experience abdominal discomfort such as a feeling of fullness or "gas" pains.  FOLLOW-UP Your doctor will discuss the results of your test with you.  SEEK IMMEDIATE MEDICAL ATTENTION IF ANY OF THE FOLLOWING OCCUR: Excessive nausea (feeling sick to your stomach) and/or vomiting.  Severe abdominal pain and distention (swelling).  Trouble swallowing.  Temperature over 101 F (37.8 C).  Rectal bleeding or vomiting of blood.    Colonoscopy Discharge Instructions  Read the instructions outlined below and refer to this sheet in the next few weeks. These discharge instructions provide you with  general information on caring for yourself after you leave the hospital. Your doctor may also give you specific instructions. While your treatment has been planned according to the most current medical practices available, unavoidable complications occasionally occur. If you have any problems or questions after discharge, call Dr. Gala Romney at [phone]. ACTIVITY You may resume your regular activity, but move at a slower pace for the next 24 hours.  Take frequent rest periods for the next 24 hours.  Walking will help get rid of the air and reduce the bloated feeling in your belly (abdomen).  No driving for 24 hours (because of the medicine (anesthesia) used during the test).   Do not sign any important legal documents or operate any machinery for 24 hours (because of the anesthesia used during the test).  NUTRITION Drink plenty of fluids.  You may resume your normal diet as instructed by your doctor.  Begin with a light meal and progress to your normal diet. Heavy or fried foods are harder to digest and may make you feel sick to your stomach (nauseated).  Avoid alcoholic beverages for 24 hours or as instructed.  MEDICATIONS You may resume your normal medications unless your doctor tells you otherwise.  WHAT YOU CAN EXPECT TODAY Some feelings of bloating in the abdomen.  Passage of more gas than usual.  Spotting of blood in your stool or on the toilet paper.  IF YOU HAD POLYPS REMOVED DURING THE COLONOSCOPY: No aspirin products for 7 days or as instructed.  No alcohol for 7 days or as instructed.  Eat a soft diet for the next 24 hours.  FINDING  OUT THE RESULTS OF YOUR TEST Not all test results are available during your visit. If your test results are not back during the visit, make an appointment with your caregiver to find out the results. Do not assume everything is normal if you have not heard from your caregiver or the medical facility. It is important for you to follow up on all of your test  results.  SEEK IMMEDIATE MEDICAL ATTENTION IF: You have more than a spotting of blood in your stool.  Your belly is swollen (abdominal distention).  You are nauseated or vomiting.  You have a temperature over 101.  You have abdominal pain or discomfort that is severe or gets worse throughout the day.     Your esophagus was stretched today.  You had a Schatzki's ring, a narrowing in your esophagus related to acid reflux  Begin Dexilant 60 mg daily 30 minutes before breakfast as previously prescribed  You have 1 polyp in your colon, diverticulosis and hemorrhoids-the latter likely cause of bleeding in the setting of constipation.  Continue fiber supplement and MiraLAX daily as needed  Polyp was removed.  Further recommendations once I get the polyp pathology report back for review  Office visit with [place] in 3 months  At patient request, I called Janyth Contes at [phone] unable to reach.

## 2021-12-27 ENCOUNTER — Encounter: Payer: Self-pay | Admitting: Internal Medicine

## 2021-12-27 LAB — SURGICAL PATHOLOGY

## 2021-12-31 ENCOUNTER — Telehealth: Payer: Self-pay | Admitting: *Deleted

## 2021-12-31 NOTE — Telephone Encounter (Signed)
Pt called inquiring about Dexilant 60 mg. Wanted to know if the PA was completed. I informed her that I did complete the PA and sent it to [facility] and I was waiting for response. Pt voiced understanding. I informed her I would call her as soon as I found out something.

## 2022-01-01 ENCOUNTER — Encounter (HOSPITAL_COMMUNITY): Payer: Self-pay | Admitting: Internal Medicine

## 2022-01-24 ENCOUNTER — Other Ambulatory Visit: Payer: Self-pay

## 2022-01-24 MED ORDER — ESOMEPRAZOLE MAGNESIUM 40 MG PO CPDR: 40.0000 mg | DELAYED_RELEASE_CAPSULE | Freq: Every day | ORAL | 11 refills | Status: DC

## 2022-01-24 NOTE — Telephone Encounter (Signed)
Pt's insurance has denied the [place]. They are requiring her to try esomeprazole. Pt also states that she is still having pain at the bottom of her esophagus. Pt is aware that you are out of the office until Tuesday.

## 2022-01-24 NOTE — Telephone Encounter (Signed)
Pt was made aware and Rx was sent to pharmacy. Please schedule f/u with APP in one month.

## 2022-03-05 ENCOUNTER — Ambulatory Visit: Payer: Self-pay | Admitting: Gastroenterology

## 2022-03-26 ENCOUNTER — Ambulatory Visit: Payer: Self-pay | Admitting: Gastroenterology

## 2022-03-31 DIAGNOSIS — K219 Gastro-esophageal reflux disease without esophagitis: Secondary | ICD-10-CM | POA: Diagnosis not present

## 2022-03-31 DIAGNOSIS — E1165 Type 2 diabetes mellitus with hyperglycemia: Secondary | ICD-10-CM | POA: Diagnosis not present

## 2022-03-31 DIAGNOSIS — I10 Essential (primary) hypertension: Secondary | ICD-10-CM | POA: Diagnosis not present

## 2022-03-31 DIAGNOSIS — E7849 Other hyperlipidemia: Secondary | ICD-10-CM | POA: Diagnosis not present

## 2022-04-02 DIAGNOSIS — M797 Fibromyalgia: Secondary | ICD-10-CM | POA: Diagnosis not present

## 2022-04-02 DIAGNOSIS — F411 Generalized anxiety disorder: Secondary | ICD-10-CM | POA: Diagnosis not present

## 2022-04-02 DIAGNOSIS — E6609 Other obesity due to excess calories: Secondary | ICD-10-CM | POA: Diagnosis not present

## 2022-04-02 DIAGNOSIS — K2100 Gastro-esophageal reflux disease with esophagitis, without bleeding: Secondary | ICD-10-CM | POA: Diagnosis not present

## 2022-04-02 DIAGNOSIS — E7849 Other hyperlipidemia: Secondary | ICD-10-CM | POA: Diagnosis not present

## 2022-04-02 DIAGNOSIS — F3341 Major depressive disorder, recurrent, in partial remission: Secondary | ICD-10-CM | POA: Diagnosis not present

## 2022-04-02 DIAGNOSIS — Z6831 Body mass index (BMI) 31.0-31.9, adult: Secondary | ICD-10-CM | POA: Diagnosis not present

## 2022-04-02 DIAGNOSIS — I10 Essential (primary) hypertension: Secondary | ICD-10-CM | POA: Diagnosis not present

## 2022-04-02 DIAGNOSIS — E1165 Type 2 diabetes mellitus with hyperglycemia: Secondary | ICD-10-CM | POA: Diagnosis not present

## 2022-04-07 ENCOUNTER — Ambulatory Visit: Payer: Self-pay | Admitting: Gastroenterology
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
## 2022-04-21 ENCOUNTER — Ambulatory Visit: Payer: Self-pay | Admitting: Gastroenterology

## 2022-05-16 ENCOUNTER — Other Ambulatory Visit: Payer: Self-pay | Admitting: Internal Medicine

## 2022-05-20 DIAGNOSIS — R300 Dysuria: Secondary | ICD-10-CM | POA: Diagnosis not present

## 2022-05-20 DIAGNOSIS — Z6832 Body mass index (BMI) 32.0-32.9, adult: Secondary | ICD-10-CM | POA: Diagnosis not present

## 2022-05-20 DIAGNOSIS — R030 Elevated blood-pressure reading, without diagnosis of hypertension: Secondary | ICD-10-CM | POA: Diagnosis not present

## 2022-05-20 DIAGNOSIS — L723 Sebaceous cyst: Secondary | ICD-10-CM | POA: Diagnosis not present

## 2022-05-20 DIAGNOSIS — M2550 Pain in unspecified joint: Secondary | ICD-10-CM | POA: Diagnosis not present

## 2022-05-26 DIAGNOSIS — R102 Pelvic and perineal pain: Secondary | ICD-10-CM | POA: Diagnosis not present

## 2022-05-26 DIAGNOSIS — R300 Dysuria: Secondary | ICD-10-CM | POA: Diagnosis not present

## 2022-05-29 DIAGNOSIS — M533 Sacrococcygeal disorders, not elsewhere classified: Secondary | ICD-10-CM | POA: Diagnosis not present

## 2022-05-29 DIAGNOSIS — M5459 Other low back pain: Secondary | ICD-10-CM | POA: Diagnosis not present

## 2022-05-29 DIAGNOSIS — M961 Postlaminectomy syndrome, not elsewhere classified: Secondary | ICD-10-CM | POA: Diagnosis not present

## 2022-05-29 DIAGNOSIS — M5417 Radiculopathy, lumbosacral region: Secondary | ICD-10-CM | POA: Diagnosis not present

## 2022-06-12 DIAGNOSIS — I10 Essential (primary) hypertension: Secondary | ICD-10-CM | POA: Diagnosis not present

## 2022-06-12 DIAGNOSIS — Z6832 Body mass index (BMI) 32.0-32.9, adult: Secondary | ICD-10-CM | POA: Diagnosis not present

## 2022-06-12 DIAGNOSIS — M2550 Pain in unspecified joint: Secondary | ICD-10-CM | POA: Diagnosis not present

## 2022-06-19 DIAGNOSIS — I6523 Occlusion and stenosis of bilateral carotid arteries: Secondary | ICD-10-CM | POA: Diagnosis not present

## 2022-06-19 DIAGNOSIS — I639 Cerebral infarction, unspecified: Secondary | ICD-10-CM | POA: Diagnosis not present

## 2022-06-19 DIAGNOSIS — Z885 Allergy status to narcotic agent status: Secondary | ICD-10-CM | POA: Diagnosis not present

## 2022-06-19 DIAGNOSIS — F32A Depression, unspecified: Secondary | ICD-10-CM | POA: Diagnosis not present

## 2022-06-19 DIAGNOSIS — I10 Essential (primary) hypertension: Secondary | ICD-10-CM | POA: Diagnosis not present

## 2022-06-19 DIAGNOSIS — E119 Type 2 diabetes mellitus without complications: Secondary | ICD-10-CM | POA: Diagnosis not present

## 2022-06-19 DIAGNOSIS — F419 Anxiety disorder, unspecified: Secondary | ICD-10-CM | POA: Diagnosis not present

## 2022-06-19 DIAGNOSIS — R079 Chest pain, unspecified: Secondary | ICD-10-CM | POA: Diagnosis not present

## 2022-06-19 DIAGNOSIS — Z882 Allergy status to sulfonamides status: Secondary | ICD-10-CM | POA: Diagnosis not present

## 2022-06-19 DIAGNOSIS — F4310 Post-traumatic stress disorder, unspecified: Secondary | ICD-10-CM | POA: Diagnosis not present

## 2022-06-19 DIAGNOSIS — H538 Other visual disturbances: Secondary | ICD-10-CM | POA: Diagnosis not present

## 2022-06-20 DIAGNOSIS — Z9282 Status post administration of tPA (rtPA) in a different facility within the last 24 hours prior to admission to current facility: Secondary | ICD-10-CM | POA: Diagnosis not present

## 2022-06-20 DIAGNOSIS — R0789 Other chest pain: Secondary | ICD-10-CM | POA: Diagnosis not present

## 2022-06-20 DIAGNOSIS — H547 Unspecified visual loss: Secondary | ICD-10-CM | POA: Diagnosis not present

## 2022-06-20 DIAGNOSIS — Z794 Long term (current) use of insulin: Secondary | ICD-10-CM | POA: Diagnosis not present

## 2022-06-20 DIAGNOSIS — M461 Sacroiliitis, not elsewhere classified: Secondary | ICD-10-CM | POA: Diagnosis not present

## 2022-06-20 DIAGNOSIS — Z1152 Encounter for screening for COVID-19: Secondary | ICD-10-CM | POA: Diagnosis not present

## 2022-06-20 DIAGNOSIS — I639 Cerebral infarction, unspecified: Secondary | ICD-10-CM | POA: Diagnosis not present

## 2022-06-20 DIAGNOSIS — Z7984 Long term (current) use of oral hypoglycemic drugs: Secondary | ICD-10-CM | POA: Diagnosis not present

## 2022-06-20 DIAGNOSIS — R29703 NIHSS score 3: Secondary | ICD-10-CM | POA: Diagnosis not present

## 2022-06-20 DIAGNOSIS — Z8673 Personal history of transient ischemic attack (TIA), and cerebral infarction without residual deficits: Secondary | ICD-10-CM | POA: Diagnosis not present

## 2022-06-20 DIAGNOSIS — R4781 Slurred speech: Secondary | ICD-10-CM | POA: Diagnosis not present

## 2022-06-20 DIAGNOSIS — Z9682 Presence of neurostimulator: Secondary | ICD-10-CM | POA: Diagnosis not present

## 2022-06-20 DIAGNOSIS — I6381 Other cerebral infarction due to occlusion or stenosis of small artery: Secondary | ICD-10-CM | POA: Diagnosis not present

## 2022-06-20 DIAGNOSIS — R519 Headache, unspecified: Secondary | ICD-10-CM | POA: Diagnosis not present

## 2022-06-20 DIAGNOSIS — R079 Chest pain, unspecified: Secondary | ICD-10-CM | POA: Diagnosis not present

## 2022-06-20 DIAGNOSIS — E782 Mixed hyperlipidemia: Secondary | ICD-10-CM | POA: Diagnosis not present

## 2022-06-20 DIAGNOSIS — E119 Type 2 diabetes mellitus without complications: Secondary | ICD-10-CM | POA: Diagnosis not present

## 2022-06-20 DIAGNOSIS — K219 Gastro-esophageal reflux disease without esophagitis: Secondary | ICD-10-CM | POA: Diagnosis not present

## 2022-06-20 DIAGNOSIS — G4733 Obstructive sleep apnea (adult) (pediatric): Secondary | ICD-10-CM | POA: Diagnosis not present

## 2022-06-20 DIAGNOSIS — Z87898 Personal history of other specified conditions: Secondary | ICD-10-CM | POA: Diagnosis not present

## 2022-06-20 DIAGNOSIS — G43909 Migraine, unspecified, not intractable, without status migrainosus: Secondary | ICD-10-CM | POA: Diagnosis not present

## 2022-06-20 DIAGNOSIS — E669 Obesity, unspecified: Secondary | ICD-10-CM | POA: Diagnosis not present

## 2022-06-20 DIAGNOSIS — M961 Postlaminectomy syndrome, not elsewhere classified: Secondary | ICD-10-CM | POA: Diagnosis not present

## 2022-06-20 DIAGNOSIS — Z79899 Other long term (current) drug therapy: Secondary | ICD-10-CM | POA: Diagnosis not present

## 2022-06-20 DIAGNOSIS — R202 Paresthesia of skin: Secondary | ICD-10-CM | POA: Diagnosis not present

## 2022-06-20 DIAGNOSIS — I10 Essential (primary) hypertension: Secondary | ICD-10-CM | POA: Diagnosis not present

## 2022-06-20 DIAGNOSIS — Z6832 Body mass index (BMI) 32.0-32.9, adult: Secondary | ICD-10-CM | POA: Diagnosis not present

## 2022-06-20 DIAGNOSIS — Z7409 Other reduced mobility: Secondary | ICD-10-CM | POA: Diagnosis not present

## 2022-06-20 DIAGNOSIS — G894 Chronic pain syndrome: Secondary | ICD-10-CM | POA: Diagnosis not present

## 2022-06-20 DIAGNOSIS — R29700 NIHSS score 0: Secondary | ICD-10-CM | POA: Diagnosis not present

## 2022-06-20 HISTORY — DX: Cerebral infarction, unspecified: I63.9

## 2022-06-25 DIAGNOSIS — E1169 Type 2 diabetes mellitus with other specified complication: Secondary | ICD-10-CM | POA: Diagnosis not present

## 2022-06-25 DIAGNOSIS — G4733 Obstructive sleep apnea (adult) (pediatric): Secondary | ICD-10-CM | POA: Diagnosis not present

## 2022-06-25 DIAGNOSIS — F1190 Opioid use, unspecified, uncomplicated: Secondary | ICD-10-CM | POA: Diagnosis not present

## 2022-06-25 DIAGNOSIS — Z8673 Personal history of transient ischemic attack (TIA), and cerebral infarction without residual deficits: Secondary | ICD-10-CM | POA: Diagnosis not present

## 2022-06-25 DIAGNOSIS — G43109 Migraine with aura, not intractable, without status migrainosus: Secondary | ICD-10-CM | POA: Diagnosis not present

## 2022-06-25 DIAGNOSIS — E669 Obesity, unspecified: Secondary | ICD-10-CM | POA: Diagnosis not present

## 2022-06-25 DIAGNOSIS — E782 Mixed hyperlipidemia: Secondary | ICD-10-CM | POA: Diagnosis not present

## 2022-06-25 DIAGNOSIS — I10 Essential (primary) hypertension: Secondary | ICD-10-CM | POA: Diagnosis not present

## 2022-07-01 DIAGNOSIS — I639 Cerebral infarction, unspecified: Secondary | ICD-10-CM | POA: Diagnosis not present

## 2022-07-15 ENCOUNTER — Other Ambulatory Visit: Payer: Self-pay | Admitting: Orthopedic Surgery

## 2022-07-15 DIAGNOSIS — M545 Low back pain, unspecified: Secondary | ICD-10-CM

## 2022-07-16 ENCOUNTER — Encounter: Payer: Self-pay | Admitting: Orthopedic Surgery

## 2022-07-23 DIAGNOSIS — Z6831 Body mass index (BMI) 31.0-31.9, adult: Secondary | ICD-10-CM | POA: Diagnosis not present

## 2022-07-23 DIAGNOSIS — I10 Essential (primary) hypertension: Secondary | ICD-10-CM | POA: Diagnosis not present

## 2022-07-23 DIAGNOSIS — E782 Mixed hyperlipidemia: Secondary | ICD-10-CM | POA: Diagnosis not present

## 2022-07-23 DIAGNOSIS — E1165 Type 2 diabetes mellitus with hyperglycemia: Secondary | ICD-10-CM | POA: Diagnosis not present

## 2022-07-23 DIAGNOSIS — G43109 Migraine with aura, not intractable, without status migrainosus: Secondary | ICD-10-CM | POA: Diagnosis not present

## 2022-07-23 DIAGNOSIS — F411 Generalized anxiety disorder: Secondary | ICD-10-CM | POA: Diagnosis not present

## 2022-07-23 DIAGNOSIS — Z8673 Personal history of transient ischemic attack (TIA), and cerebral infarction without residual deficits: Secondary | ICD-10-CM | POA: Diagnosis not present

## 2022-07-24 DIAGNOSIS — E782 Mixed hyperlipidemia: Secondary | ICD-10-CM | POA: Diagnosis not present

## 2022-07-24 DIAGNOSIS — G4733 Obstructive sleep apnea (adult) (pediatric): Secondary | ICD-10-CM | POA: Diagnosis not present

## 2022-07-24 DIAGNOSIS — E1122 Type 2 diabetes mellitus with diabetic chronic kidney disease: Secondary | ICD-10-CM | POA: Diagnosis not present

## 2022-07-24 DIAGNOSIS — I129 Hypertensive chronic kidney disease with stage 1 through stage 4 chronic kidney disease, or unspecified chronic kidney disease: Secondary | ICD-10-CM | POA: Diagnosis not present

## 2022-07-29 ENCOUNTER — Ambulatory Visit
Admission: RE | Admit: 2022-07-29 | Discharge: 2022-07-29 | Disposition: A | Discharge status: Home / Self Care | Source: Ambulatory Visit | Attending: Orthopedic Surgery | Admitting: Orthopedic Surgery

## 2022-07-29 DIAGNOSIS — I10 Essential (primary) hypertension: Secondary | ICD-10-CM | POA: Diagnosis not present

## 2022-07-29 DIAGNOSIS — E7849 Other hyperlipidemia: Secondary | ICD-10-CM | POA: Diagnosis not present

## 2022-07-29 DIAGNOSIS — M545 Low back pain, unspecified: Secondary | ICD-10-CM

## 2022-07-29 DIAGNOSIS — F3341 Major depressive disorder, recurrent, in partial remission: Secondary | ICD-10-CM | POA: Diagnosis not present

## 2022-07-29 DIAGNOSIS — E6609 Other obesity due to excess calories: Secondary | ICD-10-CM | POA: Diagnosis not present

## 2022-07-29 DIAGNOSIS — Z6831 Body mass index (BMI) 31.0-31.9, adult: Secondary | ICD-10-CM | POA: Diagnosis not present

## 2022-07-29 DIAGNOSIS — E559 Vitamin D deficiency, unspecified: Secondary | ICD-10-CM | POA: Diagnosis not present

## 2022-07-29 DIAGNOSIS — K2100 Gastro-esophageal reflux disease with esophagitis, without bleeding: Secondary | ICD-10-CM | POA: Diagnosis not present

## 2022-07-29 DIAGNOSIS — E1165 Type 2 diabetes mellitus with hyperglycemia: Secondary | ICD-10-CM | POA: Diagnosis not present

## 2022-07-29 DIAGNOSIS — M797 Fibromyalgia: Secondary | ICD-10-CM | POA: Diagnosis not present

## 2022-07-29 DIAGNOSIS — F411 Generalized anxiety disorder: Secondary | ICD-10-CM | POA: Diagnosis not present

## 2022-08-08 DIAGNOSIS — Z79899 Other long term (current) drug therapy: Secondary | ICD-10-CM | POA: Diagnosis not present

## 2022-08-08 DIAGNOSIS — N189 Chronic kidney disease, unspecified: Secondary | ICD-10-CM | POA: Diagnosis not present

## 2022-08-08 DIAGNOSIS — E1122 Type 2 diabetes mellitus with diabetic chronic kidney disease: Secondary | ICD-10-CM | POA: Diagnosis not present

## 2022-08-08 DIAGNOSIS — G4733 Obstructive sleep apnea (adult) (pediatric): Secondary | ICD-10-CM | POA: Diagnosis not present

## 2022-08-08 DIAGNOSIS — I129 Hypertensive chronic kidney disease with stage 1 through stage 4 chronic kidney disease, or unspecified chronic kidney disease: Secondary | ICD-10-CM | POA: Diagnosis not present

## 2022-08-08 DIAGNOSIS — I639 Cerebral infarction, unspecified: Secondary | ICD-10-CM | POA: Diagnosis not present

## 2022-08-08 DIAGNOSIS — E782 Mixed hyperlipidemia: Secondary | ICD-10-CM | POA: Diagnosis not present

## 2022-08-18 DIAGNOSIS — E782 Mixed hyperlipidemia: Secondary | ICD-10-CM | POA: Diagnosis not present

## 2022-08-18 DIAGNOSIS — I10 Essential (primary) hypertension: Secondary | ICD-10-CM | POA: Diagnosis not present

## 2022-08-18 DIAGNOSIS — Z8673 Personal history of transient ischemic attack (TIA), and cerebral infarction without residual deficits: Secondary | ICD-10-CM | POA: Diagnosis not present

## 2022-08-20 DIAGNOSIS — G4733 Obstructive sleep apnea (adult) (pediatric): Secondary | ICD-10-CM | POA: Diagnosis not present

## 2022-08-20 DIAGNOSIS — I129 Hypertensive chronic kidney disease with stage 1 through stage 4 chronic kidney disease, or unspecified chronic kidney disease: Secondary | ICD-10-CM | POA: Diagnosis not present

## 2022-08-20 DIAGNOSIS — E1122 Type 2 diabetes mellitus with diabetic chronic kidney disease: Secondary | ICD-10-CM | POA: Diagnosis not present

## 2022-08-20 DIAGNOSIS — N1831 Chronic kidney disease, stage 3a: Secondary | ICD-10-CM | POA: Diagnosis not present

## 2022-09-02 ENCOUNTER — Other Ambulatory Visit (HOSPITAL_COMMUNITY): Payer: Self-pay | Admitting: Nephrology

## 2022-09-02 DIAGNOSIS — I129 Hypertensive chronic kidney disease with stage 1 through stage 4 chronic kidney disease, or unspecified chronic kidney disease: Secondary | ICD-10-CM

## 2022-09-02 DIAGNOSIS — E1122 Type 2 diabetes mellitus with diabetic chronic kidney disease: Secondary | ICD-10-CM

## 2022-09-02 DIAGNOSIS — E782 Mixed hyperlipidemia: Secondary | ICD-10-CM

## 2022-09-04 DIAGNOSIS — M961 Postlaminectomy syndrome, not elsewhere classified: Secondary | ICD-10-CM | POA: Diagnosis not present

## 2022-09-04 DIAGNOSIS — F1190 Opioid use, unspecified, uncomplicated: Secondary | ICD-10-CM | POA: Diagnosis not present

## 2022-09-04 DIAGNOSIS — Z9689 Presence of other specified functional implants: Secondary | ICD-10-CM | POA: Diagnosis not present

## 2022-09-04 DIAGNOSIS — M533 Sacrococcygeal disorders, not elsewhere classified: Secondary | ICD-10-CM | POA: Diagnosis not present

## 2022-09-04 DIAGNOSIS — Z79899 Other long term (current) drug therapy: Secondary | ICD-10-CM | POA: Diagnosis not present

## 2022-09-04 DIAGNOSIS — G8929 Other chronic pain: Secondary | ICD-10-CM | POA: Diagnosis not present

## 2022-09-10 ENCOUNTER — Other Ambulatory Visit: Payer: Self-pay | Admitting: Orthopedic Surgery

## 2022-09-11 DIAGNOSIS — H40033 Anatomical narrow angle, bilateral: Secondary | ICD-10-CM | POA: Diagnosis not present

## 2022-09-11 DIAGNOSIS — H2513 Age-related nuclear cataract, bilateral: Secondary | ICD-10-CM | POA: Diagnosis not present

## 2022-09-17 ENCOUNTER — Ambulatory Visit (HOSPITAL_COMMUNITY)
Admission: RE | Admit: 2022-09-17 | Discharge: 2022-09-17 | Disposition: A | Discharge status: Home / Self Care | Payer: PPO | Source: Ambulatory Visit | Attending: Nephrology | Admitting: Nephrology

## 2022-09-17 DIAGNOSIS — E782 Mixed hyperlipidemia: Secondary | ICD-10-CM | POA: Insufficient documentation

## 2022-09-17 DIAGNOSIS — I129 Hypertensive chronic kidney disease with stage 1 through stage 4 chronic kidney disease, or unspecified chronic kidney disease: Secondary | ICD-10-CM | POA: Insufficient documentation

## 2022-09-17 DIAGNOSIS — N189 Chronic kidney disease, unspecified: Secondary | ICD-10-CM | POA: Diagnosis not present

## 2022-09-17 DIAGNOSIS — E1122 Type 2 diabetes mellitus with diabetic chronic kidney disease: Secondary | ICD-10-CM | POA: Insufficient documentation

## 2022-09-17 NOTE — Pre-Procedure Instructions (Signed)
Surgical Instructions   Your procedure is scheduled on Thursday, September 5th. Report to [facility] Main Entrance "A" at 09:00 A.M., then check in with the Admitting office. Any questions or running late day of surgery: call [phone]  Questions prior to your surgery date: call [phone], Monday-Friday, 8am-4pm. If you experience any cold or flu symptoms such as cough, fever, chills, shortness of breath, etc. between now and your scheduled surgery, please notify us at the above number.     Remember:  Do not eat after midnight the night before your surgery  You may drink clear liquids until 09:00 AM the morning of your surgery.   Clear liquids allowed are: Water, Non-Citrus Juices (without pulp), Carbonated Beverages, Clear Tea, Black Coffee Only (NO MILK, CREAM OR POWDERED CREAMER of any kind), and Gatorade.  Patient Instructions  The night before surgery:  No food after midnight. ONLY clear liquids after midnight   The day of surgery (if you have diabetes): Drink ONE (1) 12 oz G2 given to you in your pre admission testing appointment by 09:00 AM the morning of surgery. Drink in one sitting. Do not sip.  This drink was given to you during your hospital  pre-op appointment visit.  Nothing else to drink after completing the  12 oz bottle of G2.         If you have questions, please contact your surgeon's office.    Take these medicines the morning of surgery with A SIP OF WATER  fenofibrate (TRICOR)  propranolol ER (INDERAL LA)  sertraline (ZOLOFT)     May take these medicines IF NEEDED: Carboxymethylcellul-Glycerin (LUBRICATING EYE DROPS OP)  cyclobenzaprine (FLEXERIL)  esomeprazole (NEXIUM)  gabapentin (NEURONTIN)  HYDROcodone-acetaminophen (NORCO)  loratadine (CLARITIN)  LORazepam (ATIVAN)  ondansetron (ZOFRAN-ODT)  promethazine (PHENERGAN)  valACYclovir (VALTREX)    One week prior to surgery, STOP taking any Aspirin (unless otherwise instructed by your  surgeon) Aleve, Naproxen, Ibuprofen, diclofenac Sodium (VOLTAREN) gel, Motrin, Advil, Goody's, BC's, all herbal medications, fish oil, and non-prescription vitamins.  WHAT DO I DO ABOUT MY DIABETES MEDICATION?   Hold dapagliflozin propanediol (FARXIGA) for 72 hours prior to surgery. Last dose 9/1.  HOLD Semaglutide (Ozempic) for 7 days prior to surgery. Last dose on or before 8/28.       HOW TO MANAGE YOUR DIABETES BEFORE AND AFTER SURGERY  Why is it important to control my blood sugar before and after surgery? Improving blood sugar levels before and after surgery helps healing and can limit problems. A way of improving blood sugar control is eating a healthy diet by:  Eating less sugar and carbohydrates  Increasing activity/exercise  Talking with your doctor about reaching your blood sugar goals High blood sugars (greater than 180 mg/dL) can raise your risk of infections and slow your recovery, so you will need to focus on controlling your diabetes during the weeks before surgery. Make sure that the doctor who takes care of your diabetes knows about your planned surgery including the date and location.  How do I manage my blood sugar before surgery? Check your blood sugar at least 4 times a day, starting 2 days before surgery, to make sure that the level is not too high or low.  Check your blood sugar the morning of your surgery when you wake up and every 2 hours until you get to the Short Stay unit.  If your blood sugar is less than 70 mg/dL, you will need to treat for low blood sugar: Do not  take insulin. Treat a low blood sugar (less than 70 mg/dL) with  cup of clear juice (cranberry or apple), 4 glucose tablets, OR glucose gel. Recheck blood sugar in 15 minutes after treatment (to make sure it is greater than 70 mg/dL). If your blood sugar is not greater than 70 mg/dL on recheck, call [phone] for further instructions. Report your blood sugar to the short stay nurse when you  get to Short Stay.  If you are admitted to the hospital after surgery: Your blood sugar will be checked by the staff and you will probably be given insulin after surgery (instead of oral diabetes medicines) to make sure you have good blood sugar levels. The goal for blood sugar control after surgery is 80-180 mg/dL.                     Do NOT Smoke (Tobacco/Vaping) for 24 hours prior to your procedure.  If you use a CPAP at night, you may bring your mask/headgear for your overnight stay.   You will be asked to remove any contacts, glasses, piercing's, hearing aid's, dentures/partials prior to surgery. Please bring cases for these items if needed.    Patients discharged the day of surgery will not be allowed to drive home, and someone needs to stay with them for 24 hours.  SURGICAL WAITING ROOM VISITATION Patients may have no more than 2 support people in the waiting area - these visitors may rotate.   Pre-op nurse will coordinate an appropriate time for 1 ADULT support person, who may not rotate, to accompany patient in pre-op.  Children under the age of 82 must have an adult with them who is not the patient and must remain in the main waiting area with an adult.  If the patient needs to stay at the hospital during part of their recovery, the visitor guidelines for inpatient rooms apply.  Please refer to the [facility] website for the visitor guidelines for any additional information.   If you received a COVID test during your pre-op visit  it is requested that you wear a mask when out in public, stay away from anyone that may not be feeling well and notify your surgeon if you develop symptoms. If you have been in contact with anyone that has tested positive in the last 10 days please notify you surgeon.      Pre-operative 5 CHG Bathing Instructions   You can play a key role in reducing the risk of infection after surgery. Your skin needs to be as free of germs as possible. You can  reduce the number of germs on your skin by washing with CHG (chlorhexidine gluconate) soap before surgery. CHG is an antiseptic soap that kills germs and continues to kill germs even after washing.   DO NOT use if you have an allergy to chlorhexidine/CHG or antibacterial soaps. If your skin becomes reddened or irritated, stop using the CHG and notify one of our RNs at [phone].   Please shower with the CHG soap starting 4 days before surgery using the following schedule:     Please keep in mind the following:  DO NOT shave, including legs and underarms, starting the day of your first shower.   You may shave your face at any point before/day of surgery.  Place clean sheets on your bed the day you start using CHG soap. Use a clean washcloth (not used since being washed) for each shower. DO NOT sleep with pets once  you start using the CHG.   CHG Shower Instructions:  If you choose to wash your hair and private area, wash first with your normal shampoo/soap.  After you use shampoo/soap, rinse your hair and body thoroughly to remove shampoo/soap residue.  Turn the water OFF and apply about 3 tablespoons (45 ml) of CHG soap to a CLEAN washcloth.  Apply CHG soap ONLY FROM YOUR NECK DOWN TO YOUR TOES (washing for 3-5 minutes)  DO NOT use CHG soap on face, private areas, open wounds, or sores.  Pay special attention to the area where your surgery is being performed.  If you are having back surgery, having someone wash your back for you may be helpful. Wait 2 minutes after CHG soap is applied, then you may rinse off the CHG soap.  Pat dry with a clean towel  Put on clean clothes/pajamas   If you choose to wear lotion, please use ONLY the CHG-compatible lotions on the back of this paper.   Additional instructions for the day of surgery: DO NOT APPLY any lotions, deodorants, cologne, or perfumes.   Do not bring valuables to the hospital. [facility] is not responsible for any  belongings/valuables. Do not wear nail polish, gel polish, artificial nails, or any other type of covering on natural nails (fingers and toes) Do not wear jewelry or makeup Put on clean/comfortable clothes.  Please brush your teeth.  Ask your nurse before applying any prescription medications to the skin.     CHG Compatible Lotions   Aveeno Moisturizing lotion  Cetaphil Moisturizing Cream  Cetaphil Moisturizing Lotion  Clairol Herbal Essence Moisturizing Lotion, Dry Skin  Clairol Herbal Essence Moisturizing Lotion, Extra Dry Skin  Clairol Herbal Essence Moisturizing Lotion, Normal Skin  Curel Age Defying Therapeutic Moisturizing Lotion with Alpha Hydroxy  Curel Extreme Care Body Lotion  Curel Soothing Hands Moisturizing Hand Lotion  Curel Therapeutic Moisturizing Cream, Fragrance-Free  Curel Therapeutic Moisturizing Lotion, Fragrance-Free  Curel Therapeutic Moisturizing Lotion, Original Formula  Eucerin Daily Replenishing Lotion  Eucerin Dry Skin Therapy Plus Alpha Hydroxy Crme  Eucerin Dry Skin Therapy Plus Alpha Hydroxy Lotion  Eucerin Original Crme  Eucerin Original Lotion  Eucerin Plus Crme Eucerin Plus Lotion  Eucerin TriLipid Replenishing Lotion  Keri Anti-Bacterial Hand Lotion  Keri Deep Conditioning Original Lotion Dry Skin Formula Softly Scented  Keri Deep Conditioning Original Lotion, Fragrance Free Sensitive Skin Formula  Keri Lotion Fast Absorbing Fragrance Free Sensitive Skin Formula  Keri Lotion Fast Absorbing Softly Scented Dry Skin Formula  Keri Original Lotion  Keri Skin Renewal Lotion Keri Silky Smooth Lotion  Keri Silky Smooth Sensitive Skin Lotion  Nivea Body Creamy Conditioning Oil  Nivea Body Extra Enriched Lotion  Nivea Body Original Lotion  Nivea Body Sheer Moisturizing Lotion Nivea Crme  Nivea Skin Firming Lotion  NutraDerm 30 Skin Lotion  NutraDerm Skin Lotion  NutraDerm Therapeutic Skin Cream  NutraDerm Therapeutic Skin Lotion  ProShield  Protective Hand Cream  Provon moisturizing lotion  Please read over the following fact sheets that you were given.

## 2022-09-18 ENCOUNTER — Encounter (HOSPITAL_COMMUNITY): Payer: Self-pay

## 2022-09-18 ENCOUNTER — Other Ambulatory Visit: Payer: Self-pay

## 2022-09-18 ENCOUNTER — Encounter (HOSPITAL_COMMUNITY)
Admission: RE | Admit: 2022-09-18 | Discharge: 2022-09-18 | Disposition: A | Discharge status: Home / Self Care | Source: Ambulatory Visit | Attending: Orthopedic Surgery | Admitting: Orthopedic Surgery

## 2022-09-18 VITALS — BP 138/83 | HR 76 | Temp 98.3°F | Resp 18 | Ht 64.0 in | Wt 186.6 lb

## 2022-09-18 DIAGNOSIS — M797 Fibromyalgia: Secondary | ICD-10-CM | POA: Insufficient documentation

## 2022-09-18 DIAGNOSIS — G4733 Obstructive sleep apnea (adult) (pediatric): Secondary | ICD-10-CM | POA: Insufficient documentation

## 2022-09-18 DIAGNOSIS — E119 Type 2 diabetes mellitus without complications: Secondary | ICD-10-CM | POA: Diagnosis not present

## 2022-09-18 DIAGNOSIS — K259 Gastric ulcer, unspecified as acute or chronic, without hemorrhage or perforation: Secondary | ICD-10-CM | POA: Insufficient documentation

## 2022-09-18 DIAGNOSIS — K219 Gastro-esophageal reflux disease without esophagitis: Secondary | ICD-10-CM | POA: Diagnosis not present

## 2022-09-18 DIAGNOSIS — I639 Cerebral infarction, unspecified: Secondary | ICD-10-CM | POA: Insufficient documentation

## 2022-09-18 DIAGNOSIS — K449 Diaphragmatic hernia without obstruction or gangrene: Secondary | ICD-10-CM | POA: Diagnosis not present

## 2022-09-18 DIAGNOSIS — Z01812 Encounter for preprocedural laboratory examination: Secondary | ICD-10-CM | POA: Diagnosis present

## 2022-09-18 DIAGNOSIS — Z01818 Encounter for other preprocedural examination: Secondary | ICD-10-CM

## 2022-09-18 HISTORY — DX: Personal history of other diseases of the digestive system: Z87.19

## 2022-09-18 HISTORY — DX: Atrial premature depolarization: I49.1

## 2022-09-18 LAB — TYPE AND SCREEN
ABO/RH(D): O POS
Antibody Screen: NEGATIVE

## 2022-09-18 LAB — BASIC METABOLIC PANEL
Anion gap: 14 (ref 5–15)
BUN: 20 mg/dL (ref 6–20)
CO2: 26 mmol/L (ref 22–32)
Calcium: 9.9 mg/dL (ref 8.9–10.3)
Chloride: 101 mmol/L (ref 98–111)
Creatinine, Ser: 1.07 mg/dL — ABNORMAL HIGH (ref 0.44–1.00)
GFR, Estimated: >60 mL/min (ref >60)
Glucose, Bld: 133 mg/dL — ABNORMAL HIGH (ref 70–99)
Potassium: 4 mmol/L (ref 3.5–5.1)
Sodium: 141 mmol/L (ref 135–145)

## 2022-09-18 LAB — CBC
HCT: 38.1 % (ref 36.0–46.0)
Hemoglobin: 12.4 g/dL (ref 12.0–15.0)
MCH: 28.7 pg (ref 26.0–34.0)
MCHC: 32.5 g/dL (ref 30.0–36.0)
MCV: 88.2 fL (ref 80.0–100.0)
Platelets: 310 10*3/uL (ref 150–400)
RBC: 4.32 MIL/uL (ref 3.87–5.11)
RDW: 12.1 % (ref 11.5–15.5)
WBC: 6.3 10*3/uL (ref 4.0–10.5)
nRBC: 0 % (ref 0.0–0.2)

## 2022-09-18 LAB — GLUCOSE, CAPILLARY: Glucose-Capillary: 146 mg/dL — ABNORMAL HIGH (ref 70–99)

## 2022-09-18 LAB — SURGICAL PCR SCREEN
MRSA, PCR: NEGATIVE
Staphylococcus aureus: POSITIVE — AB

## 2022-09-18 LAB — HEMOGLOBIN A1C
Hgb A1c MFr Bld: 6.8 % — ABNORMAL HIGH (ref 4.8–5.6)
Mean Plasma Glucose: 148.46 mg/dL

## 2022-09-18 NOTE — Progress Notes (Signed)
PCP - Dr. Fara Chute Cardiologist - Pt saw Dr. Arther Dames after she had a stroke. She wore a holter monitor that showed no cardiac events per pt. She said she was cleared by cardiology and does not need to see them again unless she has further issues Nephrologist- Dr. Celso Amy Neurologist- Cory Roughen, FNP     PPM/ICD - denies   Chest x-ray - 06/19/22- CE EKG - 06/21/22- CE- tracing requested  Stress Test - 05/2011 ECHO - 06/20/22 Cardiac Cath - 2013  Sleep Study - OSA+ CPAP - denies, pt states she is going for an appt to find out about Inspire device  Fasting Blood Sugar - 140-180 Checks Blood Sugar once a day, in the morning  Last dose of GLP1 agonist-  09/18/22 GLP1 instructions: Hold 7 days. Pt instructed to hold medication until after surgery  Blood Thinner Instructions: n/a Aspirin Instructions: f/u with surgeon  ERAS Protcol - yes PRE-SURGERY G2- given at PAT  COVID TEST- n/a  Pt was instructed to bring spinal stimulator remote on day of surgery  Anesthesia review: yes, pt had stroke in May 2024. She was admitted to [facility]. Pt has followed up with cardiology, neurology, and nephrology since her stroke.  Patient denies shortness of breath, fever, cough and chest pain at PAT appointment   All instructions explained to the patient, with a verbal understanding of the material. Patient agrees to go over the instructions while at home for a better understanding.  The opportunity to ask questions was provided.

## 2022-09-19 NOTE — Anesthesia Preprocedure Evaluation (Addendum)
Anesthesia Evaluation  Patient identified by MRN, date of birth, ID band Patient awake    Reviewed: Allergy & Precautions, H&P , NPO status , Patient's Chart, lab work & pertinent test results, reviewed documented beta blocker date and time   History of Anesthesia Complications (+) Emergence Delirium and history of anesthetic complications  Airway Mallampati: III  TM Distance: <3 FB Neck ROM: Full    Dental no notable dental hx. (+) Teeth Intact, Dental Advisory Given,    Pulmonary neg pulmonary ROS, sleep apnea    Pulmonary exam normal breath sounds clear to auscultation       Cardiovascular hypertension, Pt. on medications and Pt. on home beta blockers + dysrhythmias  Rhythm:Regular Rate:Normal     Neuro/Psych  Headaches PSYCHIATRIC DISORDERS Anxiety Depression     Neuromuscular disease CVA, No Residual Symptoms    GI/Hepatic Neg liver ROS, hiatal hernia, PUD,GERD  Medicated and Controlled,,  Endo/Other  diabetes    Renal/GU negative Renal ROS     Musculoskeletal  (+) Arthritis , Osteoarthritis,  Fibromyalgia -  Abdominal   Peds  Hematology negative hematology ROS (+)   Anesthesia Other Findings   Reproductive/Obstetrics negative OB ROS                             Anesthesia Physical Anesthesia Plan  ASA: 3  Anesthesia Plan: General   Post-op Pain Management: Precedex and Tylenol PO (pre-op)*   Induction: Intravenous  PONV Risk Score and Plan: 4 or greater and Ondansetron and Treatment may vary due to age or medical condition  Airway Management Planned: Video Laryngoscope Planned and Oral ETT  Additional Equipment: None  Intra-op Plan:   Post-operative Plan: Extubation in OR  Informed Consent: I have reviewed the patients History and Physical, chart, labs and discussed the procedure including the risks, benefits and alternatives for the proposed anesthesia with the patient  or authorized representative who has indicated his/her understanding and acceptance.     Dental advisory given  Plan Discussed with: CRNA and Anesthesiologist  Anesthesia Plan Comments: (PAT note by Antionette Poles, PA-C: 57 year old female with pertinent history including hiatal hernia, GERD, gastric ulcer, OSA on CPAP, non-insulin-dependent DM2, fibromyalgia, s/p [employer] spinal cord stimulator implant 08/2018, recent CVA 05/2022.  Patient was admitted 5/24 through 06/22/2022 at [facility] for cryptogenic ischemic stroke aborted by administration of TNK.  Followed up outpatient with neurology 06/25/2022.  She was recommended to complete 21 days of aspirin and Plavix and continue with aspirin alone.  She was also recommended to undergo a 30-day event monitor and follow-up with cardiology.  Patient did subsequently follow-up with cardiology on 08/18/2022.  She was noted to have a normal echocardiogram on 06/20/2022 and a 28-day event monitor which demonstrated only PSVT.  She was recommended to follow-up with PCP for risk factor management and follow-up with cardiology on an as-needed basis.  Preop labs reviewed, unremarkable.  EKG 06/21/2022 (Care Everywhere): NSR.  Rate 96.  Nonspecific ST changes.  TTE 06/20/2022 (Care Everywhere): LeftVentricle: EF: 60-65%.   LeftVentricle: Wall motion is normal.   MitralValve: There is trace regurgitation.   TricuspidValve: There is trace regurgitation.    )        Anesthesia Quick Evaluation

## 2022-09-19 NOTE — Progress Notes (Signed)
Anesthesia Chart Review:  57 year old female with pertinent history including hiatal hernia, GERD, gastric ulcer, OSA on CPAP, non-insulin-dependent DM2, fibromyalgia, s/p [employer] spinal cord stimulator implant 08/2018, recent CVA 05/2022.  Patient was admitted 5/24 through 06/22/2022 at [facility] for cryptogenic ischemic stroke aborted by administration of TNK.  Followed up outpatient with neurology 06/25/2022.  She was recommended to complete 21 days of aspirin and Plavix and continue with aspirin alone.  She was also recommended to undergo a 30-day event monitor and follow-up with cardiology.  Patient did subsequently follow-up with cardiology on 08/18/2022.  She was noted to have a normal echocardiogram on 06/20/2022 and a 28-day event monitor which demonstrated only PSVT.  She was recommended to follow-up with PCP for risk factor management and follow-up with cardiology on an as-needed basis.  Preop labs reviewed, unremarkable.  EKG 06/21/2022 (Care Everywhere): NSR.  Rate 96.  Nonspecific ST changes.  TTE 06/20/2022 (Care Everywhere): Left Ventricle: EF: 60-65%.    Left Ventricle: Wall motion is normal.    Mitral Valve: There is trace regurgitation.    Tricuspid Valve: There is trace regurgitation.     Zannie Cove [facility]/Anesthesiology Phone [phone] 09/19/2022 2:19 PM

## 2022-09-25 DIAGNOSIS — M65341 Trigger finger, right ring finger: Secondary | ICD-10-CM | POA: Diagnosis not present

## 2022-09-25 DIAGNOSIS — M65351 Trigger finger, right little finger: Secondary | ICD-10-CM | POA: Diagnosis not present

## 2022-09-25 DIAGNOSIS — M65331 Trigger finger, right middle finger: Secondary | ICD-10-CM | POA: Diagnosis not present

## 2022-10-02 ENCOUNTER — Encounter (HOSPITAL_COMMUNITY)
Admission: RE | Disposition: A | Discharge status: Home / Self Care | Payer: Self-pay | Source: Ambulatory Visit | Attending: Orthopedic Surgery

## 2022-10-02 ENCOUNTER — Ambulatory Visit (HOSPITAL_BASED_OUTPATIENT_CLINIC_OR_DEPARTMENT_OTHER): Admitting: Anesthesiology

## 2022-10-02 ENCOUNTER — Other Ambulatory Visit: Payer: Self-pay

## 2022-10-02 ENCOUNTER — Ambulatory Visit (HOSPITAL_COMMUNITY): Payer: PPO

## 2022-10-02 ENCOUNTER — Ambulatory Visit (HOSPITAL_COMMUNITY)
Admission: RE | Admit: 2022-10-02 | Discharge: 2022-10-02 | Disposition: A | Discharge status: Home / Self Care | Source: Ambulatory Visit | Attending: Orthopedic Surgery | Admitting: Orthopedic Surgery

## 2022-10-02 ENCOUNTER — Ambulatory Visit (HOSPITAL_COMMUNITY): Admitting: Physician Assistant

## 2022-10-02 ENCOUNTER — Encounter (HOSPITAL_COMMUNITY): Payer: Self-pay | Admitting: Orthopedic Surgery

## 2022-10-02 DIAGNOSIS — M797 Fibromyalgia: Secondary | ICD-10-CM | POA: Diagnosis not present

## 2022-10-02 DIAGNOSIS — M1990 Unspecified osteoarthritis, unspecified site: Secondary | ICD-10-CM | POA: Diagnosis not present

## 2022-10-02 DIAGNOSIS — M533 Sacrococcygeal disorders, not elsewhere classified: Secondary | ICD-10-CM | POA: Diagnosis present

## 2022-10-02 DIAGNOSIS — K219 Gastro-esophageal reflux disease without esophagitis: Secondary | ICD-10-CM | POA: Insufficient documentation

## 2022-10-02 DIAGNOSIS — F32A Depression, unspecified: Secondary | ICD-10-CM | POA: Diagnosis not present

## 2022-10-02 DIAGNOSIS — Z8711 Personal history of peptic ulcer disease: Secondary | ICD-10-CM | POA: Diagnosis not present

## 2022-10-02 DIAGNOSIS — G4733 Obstructive sleep apnea (adult) (pediatric): Secondary | ICD-10-CM

## 2022-10-02 DIAGNOSIS — I10 Essential (primary) hypertension: Secondary | ICD-10-CM

## 2022-10-02 DIAGNOSIS — Z8673 Personal history of transient ischemic attack (TIA), and cerebral infarction without residual deficits: Secondary | ICD-10-CM | POA: Insufficient documentation

## 2022-10-02 DIAGNOSIS — K449 Diaphragmatic hernia without obstruction or gangrene: Secondary | ICD-10-CM | POA: Insufficient documentation

## 2022-10-02 DIAGNOSIS — E119 Type 2 diabetes mellitus without complications: Secondary | ICD-10-CM | POA: Insufficient documentation

## 2022-10-02 DIAGNOSIS — M461 Sacroiliitis, not elsewhere classified: Secondary | ICD-10-CM

## 2022-10-02 HISTORY — PX: SACROILIAC JOINT FUSION: SHX6088

## 2022-10-02 LAB — GLUCOSE, CAPILLARY
Glucose-Capillary: 147 mg/dL — ABNORMAL HIGH (ref 70–99)
Glucose-Capillary: 145 mg/dL — ABNORMAL HIGH (ref 70–99)

## 2022-10-02 SURGERY — SACROILIAC JOINT FUSION
Anesthesia: General | Laterality: Right

## 2022-10-02 MED ORDER — ROCURONIUM BROMIDE 10 MG/ML (PF) SYRINGE
PREFILLED_SYRINGE | INTRAVENOUS | Status: AC
  Filled 2022-10-02: qty 10

## 2022-10-02 MED ORDER — SUGAMMADEX SODIUM 200 MG/2ML IV SOLN
INTRAVENOUS | Status: DC | PRN
  Administered 2022-10-02: 400 mg via INTRAVENOUS

## 2022-10-02 MED ORDER — CEFAZOLIN SODIUM-DEXTROSE 2-4 GM/100ML-% IV SOLN
2.0000 g | INTRAVENOUS | Status: AC
  Administered 2022-10-02: 2 g via INTRAVENOUS
  Filled 2022-10-02: qty 100

## 2022-10-02 MED ORDER — CHLORHEXIDINE GLUCONATE 0.12 % MT SOLN
15.0000 mL | Freq: Once | OROMUCOSAL | Status: AC
  Administered 2022-10-02: 15 mL via OROMUCOSAL
  Filled 2022-10-02: qty 15

## 2022-10-02 MED ORDER — POVIDONE-IODINE 7.5 % EX SOLN
Freq: Once | CUTANEOUS | Status: DC
  Filled 2022-10-02: qty 118

## 2022-10-02 MED ORDER — MEPERIDINE HCL 25 MG/ML IJ SOLN: 6.2500 mg | INTRAMUSCULAR | Status: DC | PRN

## 2022-10-02 MED ORDER — OXYCODONE HCL 5 MG/5ML PO SOLN: 5.0000 mg | Freq: Once | ORAL | Status: AC | PRN

## 2022-10-02 MED ORDER — ACETAMINOPHEN 160 MG/5ML PO SOLN: 325.0000 mg | ORAL | Status: DC | PRN

## 2022-10-02 MED ORDER — ACETAMINOPHEN 325 MG PO TABS: 325.0000 mg | ORAL_TABLET | ORAL | Status: DC | PRN

## 2022-10-02 MED ORDER — 0.9 % SODIUM CHLORIDE (POUR BTL) OPTIME
TOPICAL | Status: DC | PRN
Start: 2022-10-02 — End: 2022-10-02
  Administered 2022-10-02: 1000 mL

## 2022-10-02 MED ORDER — LACTATED RINGERS IV SOLN: INTRAVENOUS | Status: DC

## 2022-10-02 MED ORDER — ACETAMINOPHEN 500 MG PO TABS: 1000.0000 mg | ORAL_TABLET | Freq: Once | ORAL | Status: DC

## 2022-10-02 MED ORDER — INSULIN ASPART 100 UNIT/ML IJ SOLN: 0.0000 [IU] | INTRAMUSCULAR | Status: DC | PRN

## 2022-10-02 MED ORDER — HYDROMORPHONE HCL 1 MG/ML IJ SOLN
INTRAMUSCULAR | Status: AC
  Filled 2022-10-02: qty 0.5

## 2022-10-02 MED ORDER — FENTANYL CITRATE (PF) 100 MCG/2ML IJ SOLN
INTRAMUSCULAR | Status: AC
  Filled 2022-10-02: qty 2

## 2022-10-02 MED ORDER — PROPOFOL 10 MG/ML IV BOLUS
INTRAVENOUS | Status: AC
  Filled 2022-10-02: qty 20

## 2022-10-02 MED ORDER — ONDANSETRON HCL 4 MG/2ML IJ SOLN
4.0000 mg | Freq: Once | INTRAMUSCULAR | Status: AC | PRN
  Administered 2022-10-02: 4 mg via INTRAVENOUS

## 2022-10-02 MED ORDER — KETAMINE HCL 50 MG/5ML IJ SOSY
PREFILLED_SYRINGE | INTRAMUSCULAR | Status: AC
  Filled 2022-10-02: qty 5

## 2022-10-02 MED ORDER — LIDOCAINE 2% (20 MG/ML) 5 ML SYRINGE
INTRAMUSCULAR | Status: AC
  Filled 2022-10-02: qty 5

## 2022-10-02 MED ORDER — FENTANYL CITRATE (PF) 100 MCG/2ML IJ SOLN
25.0000 ug | INTRAMUSCULAR | Status: DC | PRN
  Administered 2022-10-02 (×4): 25 ug via INTRAVENOUS

## 2022-10-02 MED ORDER — HYDROMORPHONE HCL 1 MG/ML IJ SOLN
INTRAMUSCULAR | Status: DC | PRN
  Administered 2022-10-02: .5 mg via INTRAVENOUS

## 2022-10-02 MED ORDER — PROPOFOL 10 MG/ML IV BOLUS
INTRAVENOUS | Status: DC | PRN
  Administered 2022-10-02: 150 ug/kg/min via INTRAVENOUS
  Administered 2022-10-02: 200 mg via INTRAVENOUS

## 2022-10-02 MED ORDER — ONDANSETRON HCL 4 MG/2ML IJ SOLN
INTRAMUSCULAR | Status: AC
  Filled 2022-10-02: qty 2

## 2022-10-02 MED ORDER — DEXMEDETOMIDINE HCL IN NACL 80 MCG/20ML IV SOLN
INTRAVENOUS | Status: AC
  Filled 2022-10-02: qty 20

## 2022-10-02 MED ORDER — LIDOCAINE 2% (20 MG/ML) 5 ML SYRINGE
INTRAMUSCULAR | Status: DC | PRN
  Administered 2022-10-02: 100 mg via INTRAVENOUS

## 2022-10-02 MED ORDER — BUPIVACAINE-EPINEPHRINE (PF) 0.25% -1:200000 IJ SOLN
INTRAMUSCULAR | Status: DC | PRN
  Administered 2022-10-02: 30 mL

## 2022-10-02 MED ORDER — MIDAZOLAM HCL 2 MG/2ML IJ SOLN
INTRAMUSCULAR | Status: DC | PRN
  Administered 2022-10-02: 2 mg via INTRAVENOUS

## 2022-10-02 MED ORDER — KETAMINE HCL 10 MG/ML IJ SOLN
INTRAMUSCULAR | Status: DC | PRN
Start: 2022-10-02 — End: 2022-10-02
  Administered 2022-10-02: 50 mg via INTRAVENOUS

## 2022-10-02 MED ORDER — MIDAZOLAM HCL 2 MG/2ML IJ SOLN
INTRAMUSCULAR | Status: AC
  Filled 2022-10-02: qty 2

## 2022-10-02 MED ORDER — OXYCODONE HCL 5 MG PO TABS
ORAL_TABLET | ORAL | Status: AC
  Filled 2022-10-02: qty 1

## 2022-10-02 MED ORDER — DEXMEDETOMIDINE HCL IN NACL 200 MCG/50ML IV SOLN
INTRAVENOUS | Status: DC | PRN
  Administered 2022-10-02 (×2): 10 ug via INTRAVENOUS

## 2022-10-02 MED ORDER — BUPIVACAINE LIPOSOME 1.3 % IJ SUSP
INTRAMUSCULAR | Status: DC | PRN
  Administered 2022-10-02: 26 mL

## 2022-10-02 MED ORDER — FENTANYL CITRATE (PF) 250 MCG/5ML IJ SOLN
INTRAMUSCULAR | Status: DC | PRN
  Administered 2022-10-02: 50 ug via INTRAVENOUS

## 2022-10-02 MED ORDER — ONDANSETRON HCL 4 MG/2ML IJ SOLN
INTRAMUSCULAR | Status: DC | PRN
  Administered 2022-10-02: 4 mg via INTRAVENOUS

## 2022-10-02 MED ORDER — ROCURONIUM BROMIDE 10 MG/ML (PF) SYRINGE
PREFILLED_SYRINGE | INTRAVENOUS | Status: DC | PRN
  Administered 2022-10-02 (×2): 50 mg via INTRAVENOUS

## 2022-10-02 MED ORDER — EPHEDRINE SULFATE (PRESSORS) 50 MG/ML IJ SOLN
INTRAMUSCULAR | Status: DC | PRN
Start: 2022-10-02 — End: 2022-10-02
  Administered 2022-10-02: 10 mg via INTRAVENOUS

## 2022-10-02 MED ORDER — BUPIVACAINE-EPINEPHRINE (PF) 0.25% -1:200000 IJ SOLN
INTRAMUSCULAR | Status: AC
  Filled 2022-10-02: qty 30

## 2022-10-02 MED ORDER — ORAL CARE MOUTH RINSE: 15.0000 mL | Freq: Once | OROMUCOSAL | Status: AC

## 2022-10-02 MED ORDER — OXYCODONE HCL 5 MG PO TABS
5.0000 mg | ORAL_TABLET | Freq: Once | ORAL | Status: AC | PRN
  Administered 2022-10-02: 5 mg via ORAL

## 2022-10-02 MED ORDER — BUPIVACAINE LIPOSOME 1.3 % IJ SUSP
INTRAMUSCULAR | Status: AC
  Filled 2022-10-02: qty 20

## 2022-10-02 SURGICAL SUPPLY — 69 items
APL SKNCLS STERI-STRIP NONHPOA (GAUZE/BANDAGES/DRESSINGS) ×1
BAG COUNTER SPONGE SURGICOUNT (BAG) ×1 IMPLANT
BAG SPNG CNTER NS LX DISP (BAG) ×1
BENZOIN TINCTURE PRP APPL 2/3 (GAUZE/BANDAGES/DRESSINGS) ×1 IMPLANT
BIT DRILL CANNULATED 7.0X3.2MM (BIT) IMPLANT
BLADE CLIPPER SURG (BLADE) ×1 IMPLANT
BLADE SURG 10 STRL SS (BLADE) ×1 IMPLANT
CANISTER SUCT 3000ML PPV (MISCELLANEOUS) ×1 IMPLANT
COVER SURGICAL LIGHT HANDLE (MISCELLANEOUS) ×2 IMPLANT
DRAPE C-ARM 42X72 X-RAY (DRAPES) ×1 IMPLANT
DRAPE C-ARMOR (DRAPES) ×1 IMPLANT
DRAPE INCISE IOBAN 66X45 STRL (DRAPES) ×1 IMPLANT
DRAPE POUCH INSTRU U-SHP 10X18 (DRAPES) ×1 IMPLANT
DRAPE SURG 17X23 STRL (DRAPES) ×3 IMPLANT
DRILL CANNULATED 7.0X3.2MM (BIT) ×1
DURAPREP 26ML APPLICATOR (WOUND CARE) ×1 IMPLANT
ELECT CAUTERY BLADE 6.4 (BLADE) ×1 IMPLANT
ELECT REM PT RETURN 9FT ADLT (ELECTROSURGICAL) ×1
ELECTRODE REM PT RTRN 9FT ADLT (ELECTROSURGICAL) ×1 IMPLANT
GAUZE 4X4 16PLY ~~LOC~~+RFID DBL (SPONGE) ×1 IMPLANT
GAUZE SPONGE 4X4 12PLY STRL (GAUZE/BANDAGES/DRESSINGS) ×1 IMPLANT
GLOVE BIO SURGEON STRL SZ 6.5 (GLOVE) ×1 IMPLANT
GLOVE BIO SURGEON STRL SZ8 (GLOVE) ×1 IMPLANT
GLOVE BIOGEL PI IND STRL 7.0 (GLOVE) ×1 IMPLANT
GLOVE BIOGEL PI IND STRL 8 (GLOVE) ×1 IMPLANT
GLOVE SURG ENC MOIS LTX SZ6.5 (GLOVE) ×1 IMPLANT
GOWN STRL REUS W/ TWL LRG LVL3 (GOWN DISPOSABLE) ×2 IMPLANT
GOWN STRL REUS W/ TWL XL LVL3 (GOWN DISPOSABLE) ×1 IMPLANT
GOWN STRL REUS W/TWL LRG LVL3 (GOWN DISPOSABLE) ×2
GOWN STRL REUS W/TWL XL LVL3 (GOWN DISPOSABLE) ×1
IMPL IFUSE 3D 7X65 (Rod) IMPLANT
IMPL IFUSE 7.0MMX55MM (Rod) IMPLANT
IMPL IFUSE 7.0X50 (Rod) IMPLANT
IMPLANT IFUSE 3D 7X65 (Rod) ×1 IMPLANT
IMPLANT IFUSE 7.0MMX55MM (Rod) ×1 IMPLANT
IMPLANT IFUSE 7.0X50 (Rod) ×1 IMPLANT
KIT BASIN OR (CUSTOM PROCEDURE TRAY) ×1 IMPLANT
KIT TURNOVER KIT B (KITS) ×1 IMPLANT
MANIFOLD NEPTUNE II (INSTRUMENTS) ×1 IMPLANT
NDL 22X1.5 STRL (OR ONLY) (MISCELLANEOUS) ×1 IMPLANT
NDL HYPO 22X1.5 SAFETY MO (MISCELLANEOUS) IMPLANT
NDL HYPO 25GX1X1/2 BEV (NEEDLE) ×1 IMPLANT
NEEDLE 22X1.5 STRL (OR ONLY) (MISCELLANEOUS) ×1
NEEDLE HYPO 22X1.5 SAFETY MO (MISCELLANEOUS) ×1
NEEDLE HYPO 25GX1X1/2 BEV (NEEDLE) ×1
NS IRRIG 1000ML POUR BTL (IV SOLUTION) ×1 IMPLANT
PACK UNIVERSAL I (CUSTOM PROCEDURE TRAY) ×1 IMPLANT
PAD ARMBOARD 7.5X6 YLW CONV (MISCELLANEOUS) ×2 IMPLANT
PENCIL BUTTON HOLSTER BLD 10FT (ELECTRODE) ×1 IMPLANT
PIN PUSH 3.2MM (PIN) IMPLANT
PIN STEINMAN (PIN) IMPLANT
PIN STEINMAN 3.2MM (PIN) IMPLANT
SPONGE T-LAP 18X18 ~~LOC~~+RFID (SPONGE) ×1 IMPLANT
STAPLER VISISTAT 35W (STAPLE) ×1 IMPLANT
STRIP CLOSURE SKIN 1/2X4 (GAUZE/BANDAGES/DRESSINGS) ×1 IMPLANT
SUT MNCRL AB 4-0 PS2 18 (SUTURE) ×1 IMPLANT
SUT VIC AB 0 CT1 18XCR BRD 8 (SUTURE) IMPLANT
SUT VIC AB 0 CT1 8-18 (SUTURE)
SUT VIC AB 1 CT1 18XCR BRD 8 (SUTURE) ×1 IMPLANT
SUT VIC AB 1 CT1 8-18 (SUTURE) ×1
SUT VIC AB 2-0 CT2 18 VCP726D (SUTURE) ×1 IMPLANT
SYR 20CC LL (SYRINGE) IMPLANT
SYR BULB IRRIG 60ML STRL (SYRINGE) ×2 IMPLANT
SYR CONTROL 10ML LL (SYRINGE) ×1 IMPLANT
TOWEL GREEN STERILE (TOWEL DISPOSABLE) ×2 IMPLANT
TOWEL GREEN STERILE FF (TOWEL DISPOSABLE) ×1 IMPLANT
TUBE CONNECTING 12X1/4 (SUCTIONS) ×1 IMPLANT
WATER STERILE IRR 1000ML POUR (IV SOLUTION) ×1 IMPLANT
YANKAUER SUCT BULB TIP NO VENT (SUCTIONS) ×1 IMPLANT
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
## 2022-10-02 NOTE — Op Note (Signed)
PATIENT NAME: Ashley [name]   MEDICAL RECORD NO.:   [id number]    DATE OF BIRTH: March 08, 1965    DATE OF PROCEDURE: 10/02/2022                                OPERATIVE REPORT     PREOPERATIVE DIAGNOSES:   Right sacroiliac joint dysfunction   POSTOPERATIVE DIAGNOSES: Right sacroiliac joint dysfunction   PROCEDURE: Minimally invasive right sacroiliac joint fusion   SURGEON:  Estill Bamberg, MD.   ASSISTANTJason Coop, PA-C.   ANESTHESIA:  General endotracheal anesthesia.   COMPLICATIONS:  None.   DISPOSITION:  Stable.   ESTIMATED BLOOD LOSS:  Minimal.   INDICATIONS FOR SURGERY:  Briefly, Ms. Minckler is a pleasant 57 year old female, who did present to me with ongoing severe pain at the right side of her low back.  Her work-up was diagnostic for right sacroiliac joint dysfunction.  She did fail appropriate conservative treatment measures, and as such, we did discuss proceeding with the surgery noted above.  The patient was made fully aware of the risks and recovery period associated with surgery, and she did wish to proceed.   OPERATIVE DETAILS:  On 10/02/2022, the patient was brought to surgery and general endotracheal anesthesia was administered.  The patient was placed prone on a flat Jackson bed, withdrawal was placed beneath the patient's chest and hips. The region of the right buttock was prepped and draped in the usual sterile fashion.  A timeout procedure was performed.  Fluoroscopy was brought into the field.  I was able to ensure adequate lateral, inlet, and outlet radiographs.  At this point, a 3 cm incision was made in line with the posterior border of the sacrum on the right .  3 guidewires were advanced across the sacroiliac joint on the right side.  Fluoroscopy was liberally used while advancing the guidewires in order to ensure a safe trajectory of the guidewires..  I then drilled and broached over the guidewires.  At this point, 7 mm implants were advanced across the  sacroiliac joints from superiorly to inferiorly, the length of the implants were 65 mm, 55 mm, and 50 mm.  I was very pleased with the resting position of the implants on lateral, inlet, and outlet fluoroscopy.  The guidewires were then removed.  I was very pleased with the final lateral, inlet, and outlet radiographs.  At this point, the wound was copiously irrigated and closed in layers, using #1 Vicryl, followed by 2-0 Vicryl, followed by 4-0 Monocryl.  All instrument counts were correct at the termination of the procedure.     Of note, Jason Coop was my assistant throughout surgery, and did aid in retraction, suctioning, and closure from start to finish.     Estill Bamberg, MD

## 2022-10-02 NOTE — Anesthesia Postprocedure Evaluation (Signed)
Anesthesia Post Note  Patient: Shauntavia R Kazmi  Procedure(s) Performed: RIGHT-SIDED SACROILIAC JOINT FUSION (Right)     Patient location during evaluation: PACU Anesthesia Type: General Level of consciousness: awake and alert Pain management: pain level controlled Vital Signs Assessment: post-procedure vital signs reviewed and stable Respiratory status: spontaneous breathing, nonlabored ventilation, respiratory function stable and patient connected to nasal cannula oxygen Cardiovascular status: blood pressure returned to baseline and stable Postop Assessment: no apparent nausea or vomiting Anesthetic complications: no   No notable events documented.  Last Vitals:  Vitals:   10/02/22 1415 10/02/22 1430  BP: 117/81 123/75  Pulse: (!) 58 (!) 57  Resp: 13 13  Temp:  (!) 36.4 C  SpO2: 97% 99%    Last Pain:  Vitals:   10/02/22 1304  TempSrc:   PainSc: Asleep                 Merci Walthers

## 2022-10-02 NOTE — Anesthesia Procedure Notes (Signed)
Procedure Name: Intubation Date/Time: 10/02/2022 11:59 AM  Performed by: Pincus Large, CRNAPre-anesthesia Checklist: Patient identified, Emergency Drugs available, Suction available and Patient being monitored Patient Re-evaluated:Patient Re-evaluated prior to induction Oxygen Delivery Method: Circle System Utilized Preoxygenation: Pre-oxygenation with 100% oxygen Induction Type: IV induction Ventilation: Mask ventilation without difficulty Laryngoscope Size: Miller and 2 Grade View: Grade II Tube type: Oral Tube size: 7.0 mm Number of attempts: 1 Airway Equipment and Method: Stylet and Oral airway Placement Confirmation: ETT inserted through vocal cords under direct vision, positive ETCO2 and breath sounds checked- equal and bilateral Secured at: 21 cm Tube secured with: Tape Dental Injury: Teeth and Oropharynx as per pre-operative assessment

## 2022-10-02 NOTE — Transfer of Care (Addendum)
Immediate Anesthesia Transfer of Care Note  Patient: Ashley [name]  Procedure(s) Performed: RIGHT-SIDED SACROILIAC JOINT FUSION (Right)  Patient Location: PACU  Anesthesia Type:General  Level of Consciousness: sedated and drowsy  Airway & Oxygen Therapy: Patient Spontanous Breathing and Patient connected to face mask oxygen  Post-op Assessment: Report given to RN and Post -op Vital signs reviewed and stable  Post vital signs: Reviewed and stable  Last Vitals:  Vitals Value Taken Time  BP 92/57 10/02/22 1304  Temp    Pulse 69 10/02/22 1308  Resp 14 10/02/22 1308  SpO2 96 % 10/02/22 1308  Vitals shown include unfiled device data.  Last Pain:  Vitals:   10/02/22 0939  TempSrc:   PainSc: 0-No pain         Complications: No notable events documented.

## 2022-10-03 ENCOUNTER — Encounter (HOSPITAL_COMMUNITY): Payer: Self-pay | Admitting: Orthopedic Surgery

## 2022-10-13 DIAGNOSIS — M47816 Spondylosis without myelopathy or radiculopathy, lumbar region: Secondary | ICD-10-CM | POA: Diagnosis not present

## 2022-10-22 ENCOUNTER — Other Ambulatory Visit: Payer: Self-pay | Admitting: Internal Medicine

## 2022-10-23 DIAGNOSIS — R300 Dysuria: Secondary | ICD-10-CM | POA: Diagnosis not present

## 2022-10-23 DIAGNOSIS — R030 Elevated blood-pressure reading, without diagnosis of hypertension: Secondary | ICD-10-CM | POA: Diagnosis not present

## 2022-10-23 DIAGNOSIS — Z6832 Body mass index (BMI) 32.0-32.9, adult: Secondary | ICD-10-CM | POA: Diagnosis not present

## 2022-10-28 DIAGNOSIS — K219 Gastro-esophageal reflux disease without esophagitis: Secondary | ICD-10-CM | POA: Diagnosis not present

## 2022-10-28 DIAGNOSIS — R739 Hyperglycemia, unspecified: Secondary | ICD-10-CM | POA: Diagnosis not present

## 2022-10-28 DIAGNOSIS — I10 Essential (primary) hypertension: Secondary | ICD-10-CM | POA: Diagnosis not present

## 2022-10-28 DIAGNOSIS — E559 Vitamin D deficiency, unspecified: Secondary | ICD-10-CM | POA: Diagnosis not present

## 2022-10-28 DIAGNOSIS — E7849 Other hyperlipidemia: Secondary | ICD-10-CM | POA: Diagnosis not present

## 2022-11-04 DIAGNOSIS — E6609 Other obesity due to excess calories: Secondary | ICD-10-CM | POA: Diagnosis not present

## 2022-11-04 DIAGNOSIS — N1831 Chronic kidney disease, stage 3a: Secondary | ICD-10-CM | POA: Diagnosis not present

## 2022-11-04 DIAGNOSIS — E7849 Other hyperlipidemia: Secondary | ICD-10-CM | POA: Diagnosis not present

## 2022-11-04 DIAGNOSIS — M797 Fibromyalgia: Secondary | ICD-10-CM | POA: Diagnosis not present

## 2022-11-04 DIAGNOSIS — E559 Vitamin D deficiency, unspecified: Secondary | ICD-10-CM | POA: Diagnosis not present

## 2022-11-04 DIAGNOSIS — Z23 Encounter for immunization: Secondary | ICD-10-CM | POA: Diagnosis not present

## 2022-11-04 DIAGNOSIS — E1165 Type 2 diabetes mellitus with hyperglycemia: Secondary | ICD-10-CM | POA: Diagnosis not present

## 2022-11-04 DIAGNOSIS — F3341 Major depressive disorder, recurrent, in partial remission: Secondary | ICD-10-CM | POA: Diagnosis not present

## 2022-11-04 DIAGNOSIS — F411 Generalized anxiety disorder: Secondary | ICD-10-CM | POA: Diagnosis not present

## 2022-11-04 DIAGNOSIS — R7989 Other specified abnormal findings of blood chemistry: Secondary | ICD-10-CM | POA: Diagnosis not present

## 2022-11-04 DIAGNOSIS — I10 Essential (primary) hypertension: Secondary | ICD-10-CM | POA: Diagnosis not present

## 2022-11-04 DIAGNOSIS — K2100 Gastro-esophageal reflux disease with esophagitis, without bleeding: Secondary | ICD-10-CM | POA: Diagnosis not present

## 2022-11-12 DIAGNOSIS — G4733 Obstructive sleep apnea (adult) (pediatric): Secondary | ICD-10-CM | POA: Diagnosis not present

## 2022-11-19 DIAGNOSIS — I129 Hypertensive chronic kidney disease with stage 1 through stage 4 chronic kidney disease, or unspecified chronic kidney disease: Secondary | ICD-10-CM | POA: Diagnosis not present

## 2022-11-19 DIAGNOSIS — E1122 Type 2 diabetes mellitus with diabetic chronic kidney disease: Secondary | ICD-10-CM | POA: Diagnosis not present

## 2022-11-19 DIAGNOSIS — N189 Chronic kidney disease, unspecified: Secondary | ICD-10-CM | POA: Diagnosis not present

## 2022-11-19 DIAGNOSIS — N1832 Chronic kidney disease, stage 3b: Secondary | ICD-10-CM | POA: Diagnosis not present

## 2022-11-25 DIAGNOSIS — I129 Hypertensive chronic kidney disease with stage 1 through stage 4 chronic kidney disease, or unspecified chronic kidney disease: Secondary | ICD-10-CM | POA: Diagnosis not present

## 2022-11-25 DIAGNOSIS — N182 Chronic kidney disease, stage 2 (mild): Secondary | ICD-10-CM | POA: Diagnosis not present

## 2022-11-25 DIAGNOSIS — E1122 Type 2 diabetes mellitus with diabetic chronic kidney disease: Secondary | ICD-10-CM | POA: Diagnosis not present

## 2022-11-25 DIAGNOSIS — G4733 Obstructive sleep apnea (adult) (pediatric): Secondary | ICD-10-CM | POA: Diagnosis not present

## 2022-12-03 DIAGNOSIS — M47816 Spondylosis without myelopathy or radiculopathy, lumbar region: Secondary | ICD-10-CM | POA: Diagnosis not present

## 2022-12-03 DIAGNOSIS — M961 Postlaminectomy syndrome, not elsewhere classified: Secondary | ICD-10-CM | POA: Diagnosis not present

## 2022-12-03 DIAGNOSIS — M533 Sacrococcygeal disorders, not elsewhere classified: Secondary | ICD-10-CM | POA: Diagnosis not present

## 2023-01-22 DIAGNOSIS — G4733 Obstructive sleep apnea (adult) (pediatric): Secondary | ICD-10-CM | POA: Diagnosis not present

## 2023-03-04 DIAGNOSIS — Z79891 Long term (current) use of opiate analgesic: Secondary | ICD-10-CM | POA: Diagnosis not present

## 2023-03-04 DIAGNOSIS — Z1321 Encounter for screening for nutritional disorder: Secondary | ICD-10-CM | POA: Diagnosis not present

## 2023-03-04 DIAGNOSIS — Z13220 Encounter for screening for lipoid disorders: Secondary | ICD-10-CM | POA: Diagnosis not present

## 2023-03-04 DIAGNOSIS — R7301 Impaired fasting glucose: Secondary | ICD-10-CM | POA: Diagnosis not present

## 2023-03-04 DIAGNOSIS — M797 Fibromyalgia: Secondary | ICD-10-CM | POA: Diagnosis not present

## 2023-03-04 DIAGNOSIS — N1831 Chronic kidney disease, stage 3a: Secondary | ICD-10-CM | POA: Diagnosis not present

## 2023-03-05 DIAGNOSIS — M47816 Spondylosis without myelopathy or radiculopathy, lumbar region: Secondary | ICD-10-CM | POA: Diagnosis not present

## 2023-03-05 DIAGNOSIS — Z79899 Other long term (current) drug therapy: Secondary | ICD-10-CM | POA: Diagnosis not present

## 2023-03-05 DIAGNOSIS — M961 Postlaminectomy syndrome, not elsewhere classified: Secondary | ICD-10-CM | POA: Diagnosis not present

## 2023-03-05 DIAGNOSIS — M533 Sacrococcygeal disorders, not elsewhere classified: Secondary | ICD-10-CM | POA: Diagnosis not present

## 2023-03-11 DIAGNOSIS — Z79891 Long term (current) use of opiate analgesic: Secondary | ICD-10-CM | POA: Diagnosis not present

## 2023-03-11 DIAGNOSIS — Z23 Encounter for immunization: Secondary | ICD-10-CM | POA: Diagnosis not present

## 2023-03-11 DIAGNOSIS — N1831 Chronic kidney disease, stage 3a: Secondary | ICD-10-CM | POA: Diagnosis not present

## 2023-03-11 DIAGNOSIS — F3341 Major depressive disorder, recurrent, in partial remission: Secondary | ICD-10-CM | POA: Diagnosis not present

## 2023-03-11 DIAGNOSIS — M797 Fibromyalgia: Secondary | ICD-10-CM | POA: Diagnosis not present

## 2023-03-11 DIAGNOSIS — Z6829 Body mass index (BMI) 29.0-29.9, adult: Secondary | ICD-10-CM | POA: Diagnosis not present

## 2023-04-16 DIAGNOSIS — G4733 Obstructive sleep apnea (adult) (pediatric): Secondary | ICD-10-CM | POA: Diagnosis not present

## 2023-04-23 DIAGNOSIS — M65351 Trigger finger, right little finger: Secondary | ICD-10-CM | POA: Diagnosis not present

## 2023-04-23 DIAGNOSIS — M65311 Trigger thumb, right thumb: Secondary | ICD-10-CM | POA: Diagnosis not present

## 2023-04-23 DIAGNOSIS — E6609 Other obesity due to excess calories: Secondary | ICD-10-CM | POA: Diagnosis not present

## 2023-04-23 DIAGNOSIS — M65321 Trigger finger, right index finger: Secondary | ICD-10-CM | POA: Diagnosis not present

## 2023-04-23 DIAGNOSIS — Z6832 Body mass index (BMI) 32.0-32.9, adult: Secondary | ICD-10-CM | POA: Diagnosis not present

## 2023-04-23 DIAGNOSIS — Z789 Other specified health status: Secondary | ICD-10-CM | POA: Diagnosis not present

## 2023-04-23 DIAGNOSIS — M65341 Trigger finger, right ring finger: Secondary | ICD-10-CM | POA: Diagnosis not present

## 2023-04-23 DIAGNOSIS — M65331 Trigger finger, right middle finger: Secondary | ICD-10-CM | POA: Diagnosis not present

## 2023-04-23 DIAGNOSIS — G4733 Obstructive sleep apnea (adult) (pediatric): Secondary | ICD-10-CM | POA: Diagnosis not present

## 2023-04-23 DIAGNOSIS — E66811 Obesity, class 1: Secondary | ICD-10-CM | POA: Diagnosis not present

## 2023-04-23 DIAGNOSIS — F40240 Claustrophobia: Secondary | ICD-10-CM | POA: Diagnosis not present

## 2023-05-14 DIAGNOSIS — Z6829 Body mass index (BMI) 29.0-29.9, adult: Secondary | ICD-10-CM | POA: Diagnosis not present

## 2023-05-14 DIAGNOSIS — F324 Major depressive disorder, single episode, in partial remission: Secondary | ICD-10-CM | POA: Diagnosis not present

## 2023-05-14 DIAGNOSIS — N1831 Chronic kidney disease, stage 3a: Secondary | ICD-10-CM | POA: Diagnosis not present

## 2023-05-14 DIAGNOSIS — G43809 Other migraine, not intractable, without status migrainosus: Secondary | ICD-10-CM | POA: Diagnosis not present

## 2023-05-14 DIAGNOSIS — M797 Fibromyalgia: Secondary | ICD-10-CM | POA: Diagnosis not present

## 2023-06-03 DIAGNOSIS — G4733 Obstructive sleep apnea (adult) (pediatric): Secondary | ICD-10-CM | POA: Diagnosis not present

## 2023-06-03 DIAGNOSIS — M533 Sacrococcygeal disorders, not elsewhere classified: Secondary | ICD-10-CM | POA: Diagnosis not present

## 2023-06-03 DIAGNOSIS — M47816 Spondylosis without myelopathy or radiculopathy, lumbar region: Secondary | ICD-10-CM | POA: Diagnosis not present

## 2023-06-03 DIAGNOSIS — Z789 Other specified health status: Secondary | ICD-10-CM | POA: Diagnosis not present

## 2023-06-03 DIAGNOSIS — E119 Type 2 diabetes mellitus without complications: Secondary | ICD-10-CM | POA: Diagnosis not present

## 2023-06-03 DIAGNOSIS — M961 Postlaminectomy syndrome, not elsewhere classified: Secondary | ICD-10-CM | POA: Diagnosis not present

## 2023-06-03 DIAGNOSIS — M5416 Radiculopathy, lumbar region: Secondary | ICD-10-CM | POA: Diagnosis not present

## 2023-06-08 ENCOUNTER — Other Ambulatory Visit (HOSPITAL_COMMUNITY)
Admission: RE | Admit: 2023-06-08 | Discharge: 2023-06-08 | Disposition: A | Discharge status: Home / Self Care | Source: Ambulatory Visit | Attending: Nephrology | Admitting: Nephrology

## 2023-06-08 DIAGNOSIS — N182 Chronic kidney disease, stage 2 (mild): Secondary | ICD-10-CM | POA: Diagnosis not present

## 2023-06-08 LAB — PROTEIN / CREATININE RATIO, URINE
Creatinine, Urine: 96 mg/dL
Total Protein, Urine: <6 mg/dL

## 2023-06-08 LAB — RENAL FUNCTION PANEL
Albumin: 4.1 g/dL (ref 3.5–5.0)
Anion gap: 7 (ref 5–15)
BUN: 22 mg/dL — ABNORMAL HIGH (ref 6–20)
CO2: 25 mmol/L (ref 22–32)
Calcium: 9.4 mg/dL (ref 8.9–10.3)
Chloride: 103 mmol/L (ref 98–111)
Creatinine, Ser: 0.8 mg/dL (ref 0.44–1.00)
GFR, Estimated: >60 mL/min (ref >60)
Glucose, Bld: 109 mg/dL — ABNORMAL HIGH (ref 70–99)
Phosphorus: 3.4 mg/dL (ref 2.5–4.6)
Potassium: 4 mmol/L (ref 3.5–5.1)
Sodium: 135 mmol/L (ref 135–145)

## 2023-06-08 LAB — CBC
HCT: 39.6 % (ref 36.0–46.0)
Hemoglobin: 13.1 g/dL (ref 12.0–15.0)
MCH: 28.9 pg (ref 26.0–34.0)
MCHC: 33.1 g/dL (ref 30.0–36.0)
MCV: 87.4 fL (ref 80.0–100.0)
Platelets: 288 10*3/uL (ref 150–400)
RBC: 4.53 MIL/uL (ref 3.87–5.11)
RDW: 12.4 % (ref 11.5–15.5)
WBC: 5.3 10*3/uL (ref 4.0–10.5)
nRBC: 0 % (ref 0.0–0.2)

## 2023-06-17 DIAGNOSIS — M65341 Trigger finger, right ring finger: Secondary | ICD-10-CM | POA: Diagnosis not present

## 2023-06-17 DIAGNOSIS — I10 Essential (primary) hypertension: Secondary | ICD-10-CM | POA: Diagnosis not present

## 2023-06-17 DIAGNOSIS — M65321 Trigger finger, right index finger: Secondary | ICD-10-CM | POA: Diagnosis not present

## 2023-06-17 DIAGNOSIS — M65351 Trigger finger, right little finger: Secondary | ICD-10-CM | POA: Diagnosis not present

## 2023-06-17 DIAGNOSIS — M65311 Trigger thumb, right thumb: Secondary | ICD-10-CM | POA: Diagnosis not present

## 2023-06-17 DIAGNOSIS — M65331 Trigger finger, right middle finger: Secondary | ICD-10-CM | POA: Diagnosis not present

## 2023-06-24 DIAGNOSIS — N1831 Chronic kidney disease, stage 3a: Secondary | ICD-10-CM | POA: Diagnosis not present

## 2023-06-24 DIAGNOSIS — D559 Anemia due to enzyme disorder, unspecified: Secondary | ICD-10-CM | POA: Diagnosis not present

## 2023-06-24 DIAGNOSIS — E559 Vitamin D deficiency, unspecified: Secondary | ICD-10-CM | POA: Diagnosis not present

## 2023-06-24 DIAGNOSIS — R42 Dizziness and giddiness: Secondary | ICD-10-CM | POA: Diagnosis not present

## 2023-06-24 DIAGNOSIS — R7301 Impaired fasting glucose: Secondary | ICD-10-CM | POA: Diagnosis not present

## 2023-06-24 DIAGNOSIS — Z13220 Encounter for screening for lipoid disorders: Secondary | ICD-10-CM | POA: Diagnosis not present

## 2023-06-27 DIAGNOSIS — R809 Proteinuria, unspecified: Secondary | ICD-10-CM | POA: Diagnosis not present

## 2023-06-27 DIAGNOSIS — N182 Chronic kidney disease, stage 2 (mild): Secondary | ICD-10-CM | POA: Diagnosis not present

## 2023-06-27 DIAGNOSIS — E559 Vitamin D deficiency, unspecified: Secondary | ICD-10-CM | POA: Diagnosis not present

## 2023-06-27 DIAGNOSIS — E1129 Type 2 diabetes mellitus with other diabetic kidney complication: Secondary | ICD-10-CM | POA: Diagnosis not present

## 2023-06-30 DIAGNOSIS — Z1231 Encounter for screening mammogram for malignant neoplasm of breast: Secondary | ICD-10-CM | POA: Diagnosis not present

## 2023-07-01 DIAGNOSIS — F324 Major depressive disorder, single episode, in partial remission: Secondary | ICD-10-CM | POA: Diagnosis not present

## 2023-07-01 DIAGNOSIS — E1142 Type 2 diabetes mellitus with diabetic polyneuropathy: Secondary | ICD-10-CM | POA: Diagnosis not present

## 2023-07-01 DIAGNOSIS — G43809 Other migraine, not intractable, without status migrainosus: Secondary | ICD-10-CM | POA: Diagnosis not present

## 2023-07-01 DIAGNOSIS — M797 Fibromyalgia: Secondary | ICD-10-CM | POA: Diagnosis not present

## 2023-07-01 DIAGNOSIS — Z6829 Body mass index (BMI) 29.0-29.9, adult: Secondary | ICD-10-CM | POA: Diagnosis not present

## 2023-07-27 DIAGNOSIS — R202 Paresthesia of skin: Secondary | ICD-10-CM | POA: Diagnosis not present

## 2023-07-27 DIAGNOSIS — M25541 Pain in joints of right hand: Secondary | ICD-10-CM | POA: Diagnosis not present

## 2023-07-27 DIAGNOSIS — M25641 Stiffness of right hand, not elsewhere classified: Secondary | ICD-10-CM | POA: Diagnosis not present

## 2023-07-27 DIAGNOSIS — M6281 Muscle weakness (generalized): Secondary | ICD-10-CM | POA: Diagnosis not present

## 2023-07-29 DIAGNOSIS — M25541 Pain in joints of right hand: Secondary | ICD-10-CM | POA: Diagnosis not present

## 2023-07-29 DIAGNOSIS — M25641 Stiffness of right hand, not elsewhere classified: Secondary | ICD-10-CM | POA: Diagnosis not present

## 2023-07-29 DIAGNOSIS — M6281 Muscle weakness (generalized): Secondary | ICD-10-CM | POA: Diagnosis not present

## 2023-07-29 DIAGNOSIS — R202 Paresthesia of skin: Secondary | ICD-10-CM | POA: Diagnosis not present

## 2023-08-03 DIAGNOSIS — Z9682 Presence of neurostimulator: Secondary | ICD-10-CM | POA: Diagnosis not present

## 2023-08-03 DIAGNOSIS — E119 Type 2 diabetes mellitus without complications: Secondary | ICD-10-CM | POA: Diagnosis not present

## 2023-08-03 DIAGNOSIS — Z6830 Body mass index (BMI) 30.0-30.9, adult: Secondary | ICD-10-CM | POA: Diagnosis not present

## 2023-08-03 DIAGNOSIS — G4733 Obstructive sleep apnea (adult) (pediatric): Secondary | ICD-10-CM | POA: Diagnosis not present

## 2023-08-03 DIAGNOSIS — Z4542 Encounter for adjustment and management of neuropacemaker (brain) (peripheral nerve) (spinal cord): Secondary | ICD-10-CM | POA: Diagnosis not present

## 2023-08-12 DIAGNOSIS — M25641 Stiffness of right hand, not elsewhere classified: Secondary | ICD-10-CM | POA: Diagnosis not present

## 2023-08-12 DIAGNOSIS — M6281 Muscle weakness (generalized): Secondary | ICD-10-CM | POA: Diagnosis not present

## 2023-08-12 DIAGNOSIS — M25541 Pain in joints of right hand: Secondary | ICD-10-CM | POA: Diagnosis not present

## 2023-08-12 DIAGNOSIS — R202 Paresthesia of skin: Secondary | ICD-10-CM | POA: Diagnosis not present

## 2023-08-14 DIAGNOSIS — M6281 Muscle weakness (generalized): Secondary | ICD-10-CM | POA: Diagnosis not present

## 2023-08-14 DIAGNOSIS — R202 Paresthesia of skin: Secondary | ICD-10-CM | POA: Diagnosis not present

## 2023-08-14 DIAGNOSIS — M25641 Stiffness of right hand, not elsewhere classified: Secondary | ICD-10-CM | POA: Diagnosis not present

## 2023-08-14 DIAGNOSIS — M25541 Pain in joints of right hand: Secondary | ICD-10-CM | POA: Diagnosis not present

## 2023-08-18 DIAGNOSIS — Z09 Encounter for follow-up examination after completed treatment for conditions other than malignant neoplasm: Secondary | ICD-10-CM | POA: Diagnosis not present

## 2023-08-21 DIAGNOSIS — M6281 Muscle weakness (generalized): Secondary | ICD-10-CM | POA: Diagnosis not present

## 2023-08-21 DIAGNOSIS — R202 Paresthesia of skin: Secondary | ICD-10-CM | POA: Diagnosis not present

## 2023-08-21 DIAGNOSIS — M25541 Pain in joints of right hand: Secondary | ICD-10-CM | POA: Diagnosis not present

## 2023-08-21 DIAGNOSIS — M25641 Stiffness of right hand, not elsewhere classified: Secondary | ICD-10-CM | POA: Diagnosis not present

## 2023-08-26 DIAGNOSIS — R202 Paresthesia of skin: Secondary | ICD-10-CM | POA: Diagnosis not present

## 2023-08-26 DIAGNOSIS — M25641 Stiffness of right hand, not elsewhere classified: Secondary | ICD-10-CM | POA: Diagnosis not present

## 2023-08-26 DIAGNOSIS — M6281 Muscle weakness (generalized): Secondary | ICD-10-CM | POA: Diagnosis not present

## 2023-08-26 DIAGNOSIS — M25541 Pain in joints of right hand: Secondary | ICD-10-CM | POA: Diagnosis not present

## 2023-09-01 DIAGNOSIS — Z6829 Body mass index (BMI) 29.0-29.9, adult: Secondary | ICD-10-CM | POA: Diagnosis not present

## 2023-09-01 DIAGNOSIS — M542 Cervicalgia: Secondary | ICD-10-CM | POA: Diagnosis not present

## 2023-09-14 DIAGNOSIS — M47816 Spondylosis without myelopathy or radiculopathy, lumbar region: Secondary | ICD-10-CM | POA: Diagnosis not present

## 2023-09-14 DIAGNOSIS — M4696 Unspecified inflammatory spondylopathy, lumbar region: Secondary | ICD-10-CM | POA: Diagnosis not present

## 2023-09-14 DIAGNOSIS — M545 Low back pain, unspecified: Secondary | ICD-10-CM | POA: Diagnosis not present

## 2023-09-14 DIAGNOSIS — M961 Postlaminectomy syndrome, not elsewhere classified: Secondary | ICD-10-CM | POA: Diagnosis not present

## 2023-09-14 DIAGNOSIS — M47812 Spondylosis without myelopathy or radiculopathy, cervical region: Secondary | ICD-10-CM | POA: Diagnosis not present

## 2023-09-14 DIAGNOSIS — Z79899 Other long term (current) drug therapy: Secondary | ICD-10-CM | POA: Diagnosis not present

## 2023-09-15 DIAGNOSIS — R300 Dysuria: Secondary | ICD-10-CM | POA: Diagnosis not present

## 2023-09-15 DIAGNOSIS — Z6830 Body mass index (BMI) 30.0-30.9, adult: Secondary | ICD-10-CM | POA: Diagnosis not present

## 2023-09-15 DIAGNOSIS — R350 Frequency of micturition: Secondary | ICD-10-CM | POA: Diagnosis not present

## 2023-09-15 DIAGNOSIS — M545 Low back pain, unspecified: Secondary | ICD-10-CM | POA: Diagnosis not present

## 2023-09-25 DIAGNOSIS — M25541 Pain in joints of right hand: Secondary | ICD-10-CM | POA: Diagnosis not present

## 2023-09-25 DIAGNOSIS — M6281 Muscle weakness (generalized): Secondary | ICD-10-CM | POA: Diagnosis not present

## 2023-09-25 DIAGNOSIS — R202 Paresthesia of skin: Secondary | ICD-10-CM | POA: Diagnosis not present

## 2023-09-25 DIAGNOSIS — M25641 Stiffness of right hand, not elsewhere classified: Secondary | ICD-10-CM | POA: Diagnosis not present

## 2023-10-05 DIAGNOSIS — M542 Cervicalgia: Secondary | ICD-10-CM | POA: Diagnosis not present

## 2023-10-05 DIAGNOSIS — Z6830 Body mass index (BMI) 30.0-30.9, adult: Secondary | ICD-10-CM | POA: Diagnosis not present

## 2023-10-05 DIAGNOSIS — M5412 Radiculopathy, cervical region: Secondary | ICD-10-CM | POA: Diagnosis not present

## 2023-10-05 DIAGNOSIS — M25512 Pain in left shoulder: Secondary | ICD-10-CM | POA: Diagnosis not present

## 2023-10-05 DIAGNOSIS — M2569 Stiffness of other specified joint, not elsewhere classified: Secondary | ICD-10-CM | POA: Diagnosis not present

## 2023-10-09 DIAGNOSIS — M25641 Stiffness of right hand, not elsewhere classified: Secondary | ICD-10-CM | POA: Diagnosis not present

## 2023-10-09 DIAGNOSIS — M25541 Pain in joints of right hand: Secondary | ICD-10-CM | POA: Diagnosis not present

## 2023-10-09 DIAGNOSIS — M6281 Muscle weakness (generalized): Secondary | ICD-10-CM | POA: Diagnosis not present

## 2023-10-09 DIAGNOSIS — R202 Paresthesia of skin: Secondary | ICD-10-CM | POA: Diagnosis not present

## 2023-10-14 DIAGNOSIS — M25512 Pain in left shoulder: Secondary | ICD-10-CM | POA: Diagnosis not present

## 2023-10-14 DIAGNOSIS — M2569 Stiffness of other specified joint, not elsewhere classified: Secondary | ICD-10-CM | POA: Diagnosis not present

## 2023-10-14 DIAGNOSIS — M542 Cervicalgia: Secondary | ICD-10-CM | POA: Diagnosis not present

## 2023-10-16 DIAGNOSIS — R202 Paresthesia of skin: Secondary | ICD-10-CM | POA: Diagnosis not present

## 2023-10-16 DIAGNOSIS — M6281 Muscle weakness (generalized): Secondary | ICD-10-CM | POA: Diagnosis not present

## 2023-10-16 DIAGNOSIS — M25641 Stiffness of right hand, not elsewhere classified: Secondary | ICD-10-CM | POA: Diagnosis not present

## 2023-10-16 DIAGNOSIS — M25541 Pain in joints of right hand: Secondary | ICD-10-CM | POA: Diagnosis not present

## 2023-10-19 DIAGNOSIS — M25512 Pain in left shoulder: Secondary | ICD-10-CM | POA: Diagnosis not present

## 2023-10-19 DIAGNOSIS — M542 Cervicalgia: Secondary | ICD-10-CM | POA: Diagnosis not present

## 2023-10-19 DIAGNOSIS — M2569 Stiffness of other specified joint, not elsewhere classified: Secondary | ICD-10-CM | POA: Diagnosis not present

## 2023-10-20 DIAGNOSIS — Z789 Other specified health status: Secondary | ICD-10-CM | POA: Diagnosis not present

## 2023-10-20 DIAGNOSIS — G4733 Obstructive sleep apnea (adult) (pediatric): Secondary | ICD-10-CM | POA: Diagnosis not present

## 2023-10-20 DIAGNOSIS — Z0389 Encounter for observation for other suspected diseases and conditions ruled out: Secondary | ICD-10-CM | POA: Diagnosis not present

## 2023-10-20 DIAGNOSIS — I471 Supraventricular tachycardia, unspecified: Secondary | ICD-10-CM | POA: Diagnosis not present

## 2023-10-22 DIAGNOSIS — N1831 Chronic kidney disease, stage 3a: Secondary | ICD-10-CM | POA: Diagnosis not present

## 2023-10-22 DIAGNOSIS — E1142 Type 2 diabetes mellitus with diabetic polyneuropathy: Secondary | ICD-10-CM | POA: Diagnosis not present

## 2023-10-22 DIAGNOSIS — Z13220 Encounter for screening for lipoid disorders: Secondary | ICD-10-CM | POA: Diagnosis not present

## 2023-10-28 DIAGNOSIS — L723 Sebaceous cyst: Secondary | ICD-10-CM | POA: Diagnosis not present

## 2023-10-28 DIAGNOSIS — N951 Menopausal and female climacteric states: Secondary | ICD-10-CM | POA: Diagnosis not present

## 2023-10-28 DIAGNOSIS — Z6830 Body mass index (BMI) 30.0-30.9, adult: Secondary | ICD-10-CM | POA: Diagnosis not present

## 2023-10-28 DIAGNOSIS — E1165 Type 2 diabetes mellitus with hyperglycemia: Secondary | ICD-10-CM | POA: Diagnosis not present

## 2023-10-28 DIAGNOSIS — M542 Cervicalgia: Secondary | ICD-10-CM | POA: Diagnosis not present

## 2023-10-28 DIAGNOSIS — R5383 Other fatigue: Secondary | ICD-10-CM | POA: Diagnosis not present

## 2023-10-30 DIAGNOSIS — M25541 Pain in joints of right hand: Secondary | ICD-10-CM | POA: Diagnosis not present

## 2023-10-30 DIAGNOSIS — M6281 Muscle weakness (generalized): Secondary | ICD-10-CM | POA: Diagnosis not present

## 2023-10-30 DIAGNOSIS — M25641 Stiffness of right hand, not elsewhere classified: Secondary | ICD-10-CM | POA: Diagnosis not present

## 2023-10-30 DIAGNOSIS — R202 Paresthesia of skin: Secondary | ICD-10-CM | POA: Diagnosis not present

## 2023-11-05 DIAGNOSIS — Z9682 Presence of neurostimulator: Secondary | ICD-10-CM | POA: Diagnosis not present

## 2023-11-05 DIAGNOSIS — G4733 Obstructive sleep apnea (adult) (pediatric): Secondary | ICD-10-CM | POA: Diagnosis not present

## 2023-11-13 DIAGNOSIS — M5031 Other cervical disc degeneration,  high cervical region: Secondary | ICD-10-CM | POA: Diagnosis not present

## 2023-11-13 DIAGNOSIS — M5030 Other cervical disc degeneration, unspecified cervical region: Secondary | ICD-10-CM | POA: Diagnosis not present

## 2023-11-13 DIAGNOSIS — M25541 Pain in joints of right hand: Secondary | ICD-10-CM | POA: Diagnosis not present

## 2023-11-13 DIAGNOSIS — M5021 Other cervical disc displacement,  high cervical region: Secondary | ICD-10-CM | POA: Diagnosis not present

## 2023-11-13 DIAGNOSIS — R202 Paresthesia of skin: Secondary | ICD-10-CM | POA: Diagnosis not present

## 2023-11-13 DIAGNOSIS — M6281 Muscle weakness (generalized): Secondary | ICD-10-CM | POA: Diagnosis not present

## 2023-11-13 DIAGNOSIS — M25641 Stiffness of right hand, not elsewhere classified: Secondary | ICD-10-CM | POA: Diagnosis not present

## 2023-11-13 DIAGNOSIS — M4802 Spinal stenosis, cervical region: Secondary | ICD-10-CM | POA: Diagnosis not present

## 2023-11-16 DIAGNOSIS — D2372 Other benign neoplasm of skin of left lower limb, including hip: Secondary | ICD-10-CM | POA: Diagnosis not present

## 2023-11-16 DIAGNOSIS — L720 Epidermal cyst: Secondary | ICD-10-CM | POA: Diagnosis not present

## 2023-11-20 DIAGNOSIS — M542 Cervicalgia: Secondary | ICD-10-CM | POA: Diagnosis not present

## 2023-11-20 DIAGNOSIS — M5412 Radiculopathy, cervical region: Secondary | ICD-10-CM | POA: Diagnosis not present

## 2023-11-26 DIAGNOSIS — M25641 Stiffness of right hand, not elsewhere classified: Secondary | ICD-10-CM | POA: Diagnosis not present

## 2023-11-26 DIAGNOSIS — M6281 Muscle weakness (generalized): Secondary | ICD-10-CM | POA: Diagnosis not present

## 2023-11-26 DIAGNOSIS — R202 Paresthesia of skin: Secondary | ICD-10-CM | POA: Diagnosis not present

## 2023-11-26 DIAGNOSIS — M25541 Pain in joints of right hand: Secondary | ICD-10-CM | POA: Diagnosis not present

## 2023-12-04 DIAGNOSIS — M25541 Pain in joints of right hand: Secondary | ICD-10-CM | POA: Diagnosis not present

## 2023-12-04 DIAGNOSIS — R202 Paresthesia of skin: Secondary | ICD-10-CM | POA: Diagnosis not present

## 2023-12-04 DIAGNOSIS — M542 Cervicalgia: Secondary | ICD-10-CM | POA: Diagnosis not present

## 2023-12-04 DIAGNOSIS — M25512 Pain in left shoulder: Secondary | ICD-10-CM | POA: Diagnosis not present

## 2023-12-04 DIAGNOSIS — M6281 Muscle weakness (generalized): Secondary | ICD-10-CM | POA: Diagnosis not present

## 2023-12-04 DIAGNOSIS — M2569 Stiffness of other specified joint, not elsewhere classified: Secondary | ICD-10-CM | POA: Diagnosis not present

## 2023-12-08 DIAGNOSIS — M5412 Radiculopathy, cervical region: Secondary | ICD-10-CM | POA: Diagnosis not present

## 2023-12-11 DIAGNOSIS — M6281 Muscle weakness (generalized): Secondary | ICD-10-CM | POA: Diagnosis not present

## 2023-12-11 DIAGNOSIS — M25641 Stiffness of right hand, not elsewhere classified: Secondary | ICD-10-CM | POA: Diagnosis not present

## 2023-12-11 DIAGNOSIS — M25541 Pain in joints of right hand: Secondary | ICD-10-CM | POA: Diagnosis not present

## 2023-12-11 DIAGNOSIS — R202 Paresthesia of skin: Secondary | ICD-10-CM | POA: Diagnosis not present

## 2023-12-14 DIAGNOSIS — M47816 Spondylosis without myelopathy or radiculopathy, lumbar region: Secondary | ICD-10-CM | POA: Diagnosis not present

## 2023-12-14 DIAGNOSIS — M25511 Pain in right shoulder: Secondary | ICD-10-CM | POA: Diagnosis not present

## 2023-12-14 DIAGNOSIS — G894 Chronic pain syndrome: Secondary | ICD-10-CM | POA: Diagnosis not present

## 2023-12-14 DIAGNOSIS — S42254A Nondisplaced fracture of greater tuberosity of right humerus, initial encounter for closed fracture: Secondary | ICD-10-CM | POA: Diagnosis not present

## 2023-12-14 DIAGNOSIS — M961 Postlaminectomy syndrome, not elsewhere classified: Secondary | ICD-10-CM | POA: Diagnosis not present

## 2023-12-17 DIAGNOSIS — Z6830 Body mass index (BMI) 30.0-30.9, adult: Secondary | ICD-10-CM | POA: Diagnosis not present

## 2023-12-17 DIAGNOSIS — M25562 Pain in left knee: Secondary | ICD-10-CM | POA: Diagnosis not present

## 2023-12-17 DIAGNOSIS — S42254A Nondisplaced fracture of greater tuberosity of right humerus, initial encounter for closed fracture: Secondary | ICD-10-CM | POA: Diagnosis not present

## 2023-12-20 DIAGNOSIS — M25641 Stiffness of right hand, not elsewhere classified: Secondary | ICD-10-CM | POA: Diagnosis not present

## 2023-12-20 DIAGNOSIS — M6281 Muscle weakness (generalized): Secondary | ICD-10-CM | POA: Diagnosis not present

## 2023-12-20 DIAGNOSIS — M25541 Pain in joints of right hand: Secondary | ICD-10-CM | POA: Diagnosis not present

## 2023-12-20 DIAGNOSIS — R202 Paresthesia of skin: Secondary | ICD-10-CM | POA: Diagnosis not present

## 2023-12-23 DIAGNOSIS — M5412 Radiculopathy, cervical region: Secondary | ICD-10-CM | POA: Diagnosis not present

## 2023-12-28 DIAGNOSIS — M25511 Pain in right shoulder: Secondary | ICD-10-CM | POA: Diagnosis not present

## 2024-02-08 ENCOUNTER — Encounter (INDEPENDENT_AMBULATORY_CARE_PROVIDER_SITE_OTHER): Payer: Self-pay | Admitting: *Deleted
